# Patient Record
Sex: Male | Born: 1935 | Race: White | Hispanic: No | Smoking: Former smoker
Health system: Southern US, Community
[De-identification: ages and names within clinical notes are randomized; demographics above are authoritative.]

## PROBLEM LIST (undated history)

## (undated) DIAGNOSIS — Z96659 Presence of unspecified artificial knee joint: Secondary | ICD-10-CM

## (undated) DIAGNOSIS — E119 Type 2 diabetes mellitus without complications: Secondary | ICD-10-CM

## (undated) DIAGNOSIS — M1990 Unspecified osteoarthritis, unspecified site: Secondary | ICD-10-CM

## (undated) DIAGNOSIS — H269 Unspecified cataract: Secondary | ICD-10-CM

## (undated) DIAGNOSIS — Z96643 Presence of artificial hip joint, bilateral: Secondary | ICD-10-CM

## (undated) HISTORY — PX: JOINT REPLACEMENT: SHX530

## (undated) HISTORY — DX: Unspecified cataract: H26.9

## (undated) HISTORY — DX: Presence of artificial hip joint, bilateral: Z96.643

---

## 1997-06-24 ENCOUNTER — Encounter: Admission: RE | Admit: 1997-06-24 | Discharge: 1997-09-22 | Payer: Self-pay | Admitting: Internal Medicine

## 2000-11-06 ENCOUNTER — Other Ambulatory Visit: Admission: RE | Admit: 2000-11-06 | Discharge: 2000-11-06 | Payer: Self-pay | Admitting: Dermatology

## 2001-11-02 ENCOUNTER — Encounter: Payer: Self-pay | Admitting: Family Medicine

## 2001-11-02 ENCOUNTER — Ambulatory Visit (HOSPITAL_COMMUNITY): Admission: RE | Admit: 2001-11-02 | Discharge: 2001-11-02 | Payer: Self-pay | Admitting: Family Medicine

## 2001-11-26 ENCOUNTER — Encounter: Payer: Self-pay | Admitting: Neurosurgery

## 2001-11-26 ENCOUNTER — Ambulatory Visit (HOSPITAL_COMMUNITY): Admission: RE | Admit: 2001-11-26 | Discharge: 2001-11-26 | Payer: Self-pay | Admitting: Neurosurgery

## 2002-08-09 ENCOUNTER — Encounter: Payer: Self-pay | Admitting: Ophthalmology

## 2002-08-09 ENCOUNTER — Ambulatory Visit (HOSPITAL_COMMUNITY): Admission: RE | Admit: 2002-08-09 | Discharge: 2002-08-09 | Payer: Self-pay | Admitting: Ophthalmology

## 2002-10-04 ENCOUNTER — Ambulatory Visit (HOSPITAL_COMMUNITY): Admission: RE | Admit: 2002-10-04 | Discharge: 2002-10-04 | Payer: Self-pay | Admitting: Family Medicine

## 2002-10-04 ENCOUNTER — Encounter: Payer: Self-pay | Admitting: Family Medicine

## 2009-05-04 ENCOUNTER — Inpatient Hospital Stay (HOSPITAL_COMMUNITY): Admission: RE | Admit: 2009-05-04 | Discharge: 2009-05-07 | Payer: Self-pay | Admitting: Orthopedic Surgery

## 2009-06-15 ENCOUNTER — Ambulatory Visit (HOSPITAL_COMMUNITY)
Admission: RE | Admit: 2009-06-15 | Discharge: 2009-06-15 | Payer: Self-pay | Source: Home / Self Care | Admitting: Family Medicine

## 2009-06-23 ENCOUNTER — Ambulatory Visit (HOSPITAL_COMMUNITY): Admission: RE | Admit: 2009-06-23 | Discharge: 2009-06-23 | Payer: Self-pay | Admitting: Family Medicine

## 2009-09-02 ENCOUNTER — Emergency Department (HOSPITAL_COMMUNITY): Admission: EM | Admit: 2009-09-02 | Discharge: 2009-09-02 | Payer: Self-pay | Admitting: Emergency Medicine

## 2010-01-31 ENCOUNTER — Encounter: Payer: Self-pay | Admitting: Orthopedic Surgery

## 2010-03-30 LAB — GLUCOSE, CAPILLARY
Glucose-Capillary: 141 mg/dL — ABNORMAL HIGH (ref 70–99)
Glucose-Capillary: 159 mg/dL — ABNORMAL HIGH (ref 70–99)
Glucose-Capillary: 187 mg/dL — ABNORMAL HIGH (ref 70–99)
Glucose-Capillary: 203 mg/dL — ABNORMAL HIGH (ref 70–99)
Glucose-Capillary: 215 mg/dL — ABNORMAL HIGH (ref 70–99)
Glucose-Capillary: 236 mg/dL — ABNORMAL HIGH (ref 70–99)
Glucose-Capillary: 248 mg/dL — ABNORMAL HIGH (ref 70–99)
Glucose-Capillary: 300 mg/dL — ABNORMAL HIGH (ref 70–99)
Glucose-Capillary: 92 mg/dL (ref 70–99)

## 2010-03-30 LAB — COMPREHENSIVE METABOLIC PANEL
ALT: 16 U/L (ref 0–53)
AST: 21 U/L (ref 0–37)
Alkaline Phosphatase: 64 U/L (ref 39–117)
Calcium: 9.5 mg/dL (ref 8.4–10.5)
Chloride: 102 mEq/L (ref 96–112)
Glucose, Bld: 78 mg/dL (ref 70–99)
Total Bilirubin: 0.9 mg/dL (ref 0.3–1.2)

## 2010-03-30 LAB — TYPE AND SCREEN: Antibody Screen: NEGATIVE

## 2010-03-30 LAB — URINALYSIS, ROUTINE W REFLEX MICROSCOPIC
Bilirubin Urine: NEGATIVE
Hgb urine dipstick: NEGATIVE
Protein, ur: NEGATIVE mg/dL

## 2010-03-30 LAB — CBC
HCT: 27.5 % — ABNORMAL LOW (ref 39.0–52.0)
Hemoglobin: 10.7 g/dL — ABNORMAL LOW (ref 13.0–17.0)
Hemoglobin: 7.7 g/dL — ABNORMAL LOW (ref 13.0–17.0)
MCHC: 34 g/dL (ref 30.0–36.0)
MCHC: 34.1 g/dL (ref 30.0–36.0)
Platelets: 145 10*3/uL — ABNORMAL LOW (ref 150–400)
Platelets: 191 10*3/uL (ref 150–400)
Platelets: 243 10*3/uL (ref 150–400)
RBC: 4.11 MIL/uL — ABNORMAL LOW (ref 4.22–5.81)
RDW: 12.4 % (ref 11.5–15.5)
RDW: 12.8 % (ref 11.5–15.5)
WBC: 10.2 10*3/uL (ref 4.0–10.5)

## 2010-03-30 LAB — PROTIME-INR
INR: 2.12 — ABNORMAL HIGH (ref 0.00–1.49)
Prothrombin Time: 13.6 seconds (ref 11.6–15.2)
Prothrombin Time: 14.7 seconds (ref 11.6–15.2)
Prothrombin Time: 19.3 seconds — ABNORMAL HIGH (ref 11.6–15.2)
Prothrombin Time: 23.6 seconds — ABNORMAL HIGH (ref 11.6–15.2)

## 2010-03-30 LAB — ABO/RH: ABO/RH(D): A NEG

## 2010-03-30 LAB — BASIC METABOLIC PANEL
BUN: 23 mg/dL (ref 6–23)
Calcium: 8.3 mg/dL — ABNORMAL LOW (ref 8.4–10.5)
Calcium: 8.9 mg/dL (ref 8.4–10.5)
Chloride: 96 mEq/L (ref 96–112)
Creatinine, Ser: 1.32 mg/dL (ref 0.4–1.5)
GFR calc Af Amer: >60 mL/min (ref >60)
GFR calc non Af Amer: 49 mL/min — ABNORMAL LOW (ref >60)
Glucose, Bld: 290 mg/dL — ABNORMAL HIGH (ref 70–99)
Sodium: 134 mEq/L — ABNORMAL LOW (ref 135–145)

## 2010-03-30 LAB — APTT: aPTT: 31 seconds (ref 24–37)

## 2010-04-05 ENCOUNTER — Other Ambulatory Visit: Payer: Self-pay | Admitting: Orthopedic Surgery

## 2010-04-05 ENCOUNTER — Encounter (HOSPITAL_COMMUNITY): Payer: Medicare Other | Attending: Orthopedic Surgery

## 2010-04-05 DIAGNOSIS — Z01818 Encounter for other preprocedural examination: Secondary | ICD-10-CM | POA: Insufficient documentation

## 2010-04-05 LAB — COMPREHENSIVE METABOLIC PANEL
ALT: 13 U/L (ref 0–53)
AST: 22 U/L (ref 0–37)
Calcium: 9.6 mg/dL (ref 8.4–10.5)
Creatinine, Ser: 1.36 mg/dL (ref 0.4–1.5)
GFR calc Af Amer: >60 mL/min (ref >60)
GFR calc non Af Amer: 51 mL/min — ABNORMAL LOW (ref >60)
Glucose, Bld: 97 mg/dL (ref 70–99)
Sodium: 137 mEq/L (ref 135–145)
Total Protein: 7.9 g/dL (ref 6.0–8.3)

## 2010-04-05 LAB — CBC
HCT: 38.8 % — ABNORMAL LOW (ref 39.0–52.0)
Hemoglobin: 12.4 g/dL — ABNORMAL LOW (ref 13.0–17.0)
MCH: 31.6 pg (ref 26.0–34.0)
MCV: 99 fL (ref 78.0–100.0)
RBC: 3.92 MIL/uL — ABNORMAL LOW (ref 4.22–5.81)

## 2010-04-05 LAB — URINE MICROSCOPIC-ADD ON

## 2010-04-05 LAB — URINALYSIS, ROUTINE W REFLEX MICROSCOPIC
Bilirubin Urine: NEGATIVE
Glucose, UA: 250 mg/dL — AB
Ketones, ur: NEGATIVE mg/dL
Specific Gravity, Urine: 1.02 (ref 1.005–1.030)
pH: 5.5 (ref 5.0–8.0)

## 2010-04-05 LAB — SURGICAL PCR SCREEN
MRSA, PCR: NEGATIVE
Staphylococcus aureus: POSITIVE — AB

## 2010-04-05 LAB — PROTIME-INR: Prothrombin Time: 12.9 seconds (ref 11.6–15.2)

## 2010-04-05 LAB — APTT: aPTT: 30 seconds (ref 24–37)

## 2010-04-09 ENCOUNTER — Emergency Department (HOSPITAL_COMMUNITY)
Admission: EM | Admit: 2010-04-09 | Discharge: 2010-04-09 | Disposition: A | Discharge status: Home / Self Care | Payer: Medicare Other | Attending: Emergency Medicine | Admitting: Emergency Medicine

## 2010-04-09 DIAGNOSIS — IMO0002 Reserved for concepts with insufficient information to code with codable children: Secondary | ICD-10-CM | POA: Insufficient documentation

## 2010-04-09 DIAGNOSIS — E119 Type 2 diabetes mellitus without complications: Secondary | ICD-10-CM | POA: Insufficient documentation

## 2010-04-14 ENCOUNTER — Inpatient Hospital Stay (HOSPITAL_COMMUNITY)
Admission: RE | Admit: 2010-04-14 | Discharge: 2010-04-18 | DRG: 470 | Disposition: A | Discharge status: Home Health | Payer: Medicare Other | Source: Ambulatory Visit | Attending: Orthopedic Surgery | Admitting: Orthopedic Surgery

## 2010-04-14 DIAGNOSIS — R112 Nausea with vomiting, unspecified: Secondary | ICD-10-CM | POA: Diagnosis not present

## 2010-04-14 DIAGNOSIS — D62 Acute posthemorrhagic anemia: Secondary | ICD-10-CM | POA: Diagnosis not present

## 2010-04-14 DIAGNOSIS — Z96659 Presence of unspecified artificial knee joint: Secondary | ICD-10-CM

## 2010-04-14 DIAGNOSIS — I10 Essential (primary) hypertension: Secondary | ICD-10-CM | POA: Diagnosis present

## 2010-04-14 DIAGNOSIS — E871 Hypo-osmolality and hyponatremia: Secondary | ICD-10-CM | POA: Diagnosis not present

## 2010-04-14 DIAGNOSIS — Z01812 Encounter for preprocedural laboratory examination: Secondary | ICD-10-CM

## 2010-04-14 DIAGNOSIS — E7800 Pure hypercholesterolemia, unspecified: Secondary | ICD-10-CM | POA: Diagnosis present

## 2010-04-14 DIAGNOSIS — E119 Type 2 diabetes mellitus without complications: Secondary | ICD-10-CM | POA: Diagnosis present

## 2010-04-14 DIAGNOSIS — M1710 Unilateral primary osteoarthritis, unspecified knee: Principal | ICD-10-CM | POA: Diagnosis present

## 2010-04-14 LAB — GLUCOSE, CAPILLARY
Glucose-Capillary: 74 mg/dL (ref 70–99)
Glucose-Capillary: 89 mg/dL (ref 70–99)
Glucose-Capillary: 86 mg/dL (ref 70–99)
Glucose-Capillary: 90 mg/dL (ref 70–99)
Glucose-Capillary: 181 mg/dL — ABNORMAL HIGH (ref 70–99)

## 2010-04-15 LAB — CBC
HCT: 29.2 % — ABNORMAL LOW (ref 39.0–52.0)
Hemoglobin: 9.4 g/dL — ABNORMAL LOW (ref 13.0–17.0)
MCH: 31.5 pg (ref 26.0–34.0)
MCHC: 32.2 g/dL (ref 30.0–36.0)
MCV: 98 fL (ref 78.0–100.0)
RBC: 2.98 MIL/uL — ABNORMAL LOW (ref 4.22–5.81)

## 2010-04-15 LAB — GLUCOSE, CAPILLARY
Glucose-Capillary: 251 mg/dL — ABNORMAL HIGH (ref 70–99)
Glucose-Capillary: 136 mg/dL — ABNORMAL HIGH (ref 70–99)

## 2010-04-15 LAB — BASIC METABOLIC PANEL
CO2: 28 mEq/L (ref 19–32)
Calcium: 8.5 mg/dL (ref 8.4–10.5)
Chloride: 102 mEq/L (ref 96–112)
Creatinine, Ser: 1.01 mg/dL (ref 0.4–1.5)
GFR calc Af Amer: >60 mL/min (ref >60)
Glucose, Bld: 273 mg/dL — ABNORMAL HIGH (ref 70–99)

## 2010-04-16 LAB — BASIC METABOLIC PANEL
BUN: 22 mg/dL (ref 6–23)
CO2: 26 mEq/L (ref 19–32)
Calcium: 8.6 mg/dL (ref 8.4–10.5)
Creatinine, Ser: 1.29 mg/dL (ref 0.4–1.5)
GFR calc non Af Amer: 54 mL/min — ABNORMAL LOW (ref >60)
Glucose, Bld: 179 mg/dL — ABNORMAL HIGH (ref 70–99)
Sodium: 134 mEq/L — ABNORMAL LOW (ref 135–145)

## 2010-04-16 LAB — CBC
Hemoglobin: 8.7 g/dL — ABNORMAL LOW (ref 13.0–17.0)
MCHC: 32.8 g/dL (ref 30.0–36.0)
RDW: 12.6 % (ref 11.5–15.5)
WBC: 8.6 10*3/uL (ref 4.0–10.5)

## 2010-04-16 LAB — GLUCOSE, CAPILLARY
Glucose-Capillary: 216 mg/dL — ABNORMAL HIGH (ref 70–99)
Glucose-Capillary: 283 mg/dL — ABNORMAL HIGH (ref 70–99)

## 2010-04-17 LAB — CBC
HCT: 22.9 % — ABNORMAL LOW (ref 39.0–52.0)
Hemoglobin: 7.5 g/dL — ABNORMAL LOW (ref 13.0–17.0)
MCH: 31.9 pg (ref 26.0–34.0)
MCHC: 32.8 g/dL (ref 30.0–36.0)
MCV: 97.4 fL (ref 78.0–100.0)
RBC: 2.35 MIL/uL — ABNORMAL LOW (ref 4.22–5.81)

## 2010-04-17 LAB — GLUCOSE, CAPILLARY
Glucose-Capillary: 189 mg/dL — ABNORMAL HIGH (ref 70–99)
Glucose-Capillary: 287 mg/dL — ABNORMAL HIGH (ref 70–99)

## 2010-04-18 LAB — TYPE AND SCREEN
ABO/RH(D): A NEG
Unit division: 0
Unit division: 0

## 2010-04-18 LAB — HEMOGLOBIN AND HEMATOCRIT, BLOOD
HCT: 27.6 % — ABNORMAL LOW (ref 39.0–52.0)
Hemoglobin: 9.2 g/dL — ABNORMAL LOW (ref 13.0–17.0)

## 2010-04-19 NOTE — H&P (Addendum)
[name], [name] NO.:  [id number]  MEDICAL RECORD NO.:  [id number]          PATIENT TYPE:  LOCATION:                                 FACILITY:  PHYSICIAN:  Ollen Gross, M.D.         DATE OF BIRTH:  DATE OF ADMISSION: DATE OF DISCHARGE:                             HISTORY & PHYSICAL   CHIEF COMPLAINT:  Right knee pain.  BRIEF HISTORY:  The patient had a left total knee replacement about a year ago.  He states his right knee is continuing to worsen.  It is at a stage  now where he feels that it will give out on him.  It is limiting what he is able to do.  He has done very well with his left total knee and he now presents for right total knee arthroplasty.  The patient has been cleared for surgery by his primary care physician, Dr.  Lilyan King.  MEDICATION ALLERGIES:  None.  CURRENT MEDICATIONS:  Include insulin, atenolol, pravastatin, and lisinopril.  PAST MEDICAL HISTORY: 1. Osteoarthritis of the right knee. 2. Hypertension. 3. Hypercholesterolemia. 4. Insulin dependent diabetes. 5. Arthritis.  PAST SURGICAL HISTORY:  Left knee arthroscopy x2, left total knee arthroplasty and tonsillectomy.  FAMILY HISTORY:  Father passed at the age of 35 from natural causes. Mother passed at age of 56, she was diabetic.  SOCIAL HISTORY:  The patient is single and states that he is retired [employer] worker.  He plans to go home following this hospital stay.  He does have someone lined up to be his caregiver.  He admits past use of cigarettes over 40 years ago.  REVIEW OF SYSTEMS:  GENERAL:  Negative for fevers, chills or weight change.  HEENT:  Negative for blurred vision or insomnia.  DERMATOLOGIC: Negative for rash or lesion.  RESPIRATORY:  Negative for shortness of breath at rest or with exertion.  CARDIOVASCULAR:  Negative for chest pain or palpitations.  GI:  Negative for nausea, vomiting, diarrhea. GU:  Negative for hematuria, dysuria.   MUSCULOSKELETAL:  Positive for joint pain and joint swelling.  PHYSICAL EXAMINATION:  VITAL SIGNS:  Pulse 70, respirations 18, blood pressure 134/68 in the left arm. GENERAL:  The patient is alert and oriented x3.  He is a pleasant 77- year-old male who is at a stated height of 5 feet 11 inches and stated weight of 206 pounds.  He is in no apparent distress. HEENT: Normocephalic, atraumatic.  Extraocular movements intact.  The patient wears glasses. NECK:  Supple.  Full range of motion without lymphadenopathy. CHEST:  Lungs are clear to auscultation bilaterally without wheezes, rhonchi or rales. HEART:  Regular rate and rhythm without murmur, S1 and S2 sounds are appreciated. ABDOMEN:  Soft, nontender to palpation.  Bowel sounds present in all 4 quadrants. EXTREMITIES:  Right knee is negative for effusion.  Range of 10 to 115 degrees, tenderness with palpation medially. SKIN:  Unremarkable. NEUROLOGIC:  Intact.  Peripheral vascular, carotid pulses 2+ bilaterally without bruit.  RADIOGRAPHS:  AP and lateral views of the patient's right knee reveal bone on bone in the medial  and patellofemoral compartments.  IMPRESSION:  End-stage arthritis of the right knee.  PLAN:  Right total knee arthroplasty to be performed by Dr. Lequita Halt.     Rozell Searing, PAC   ______________________________ Ollen Gross, M.D.    LD/MEDQ  D:  04/14/2010  T:  04/14/2010  Job:  [id number]  cc:   Lorin Picket A. Gerda Diss, MD Fax: [phone]  Electronically Signed by Ollen Gross M.D. on 04/19/2010 06:39:14 AM Electronically Signed by Rozell Searing  on 04/19/2010 08:44:17 AM

## 2010-04-19 NOTE — Op Note (Signed)
[name], [name]            ACCOUNT NO.:  [id number]  MEDICAL RECORD NO.:  [id number]           PATIENT TYPE:  I  LOCATION:  1620                         FACILITY:  [facility]  PHYSICIAN:  Ollen Gross, M.D.    DATE OF BIRTH:  1932-09-17  DATE OF PROCEDURE:  04/14/2010 DATE OF DISCHARGE:                              OPERATIVE REPORT   PREOPERATIVE DIAGNOSIS:  Osteoarthritis, right knee.  POSTOPERATIVE DIAGNOSIS:  Osteoarthritis, right knee.  PROCEDURE:  Right total knee arthroplasty.  SURGEON:  Ollen Gross, MD  ASSISTANT:  Alexzandrew L. Perkins, PA-C  ANESTHESIA:  Spinal.  ESTIMATED BLOOD LOSS:  Minimal.  DRAIN:  Hemovac x1.  TOURNIQUET TIME:  50 minutes at 300 mmHg.  COMPLICATIONS:  None.  CONDITION:  Stable to Recovery.  BRIEF CLINICAL NOTE:  [name] is a 77 year old male with advanced end-stage arthritis of the right knee with progressively worsening pain and dysfunction.  He has had a previous successful left total knee arthroplasty, he presents now for right total knee arthroplasty.  PROCEDURE IN DETAIL:  After successful administration of spinal anesthetic, a tourniquet was placed high on his right thigh and his right lower extremity was prepped and draped in the usual sterile fashion.  Extremity was wrapped in Esmarch, knee flexed, and tourniquet inflated to 300 mmHg.  Midline incision was made with a 10 blade through subcutaneous tissue to the level of the extensor mechanism.  A fresh blade was used to make a medial parapatellar arthrotomy.  Soft tissue on the proximal medial tibia was subperiosteally elevated to the joint line with a knife and into the semimembranosus bursa with a Cobb elevator. Soft tissue laterally was elevated with attention being paid to avoid any patellar tendon on tibial tubercle.  Patella was everted, knee flexed to 90 degrees, ACL and PCL removed.  Drill was used to create a starting hole in the distal femur and the  canal was thoroughly irrigated.  The 5-degree right valgus alignment guide was placed and the distal femoral cutting block was pinned to remove 12 mm off the distal femur.  I did 12 because he had about 30 degrees flexion contracture. Distal femoral resection was made with an oscillating saw.  The tibia was then subluxed forward and the menisci were removed.  The extramedullary tibial alignment guide was placed referencing proximally at the tibial tubercle and distally along the 2nd metatarsal axis and tibial crest.  Block was pinned to remove 2 mm off the more deficient medial side.  Tibial resection was made with an oscillating saw.  The size 4 was the most appropriate tibial component and the proximal tibia was prepared with a modular drill and keel punched for the size 4. Femoral preparation was then initiated.  Femoral sizing guide was placed and size 5 was most appropriate. Rotation was marked off the epicondylar axis and confirmed by creating rectangular flexion gap at 90 degrees.  The block was pinned in this rotation.  The anterior and posterior chamfer cuts were made. Intercondylar block was placed and that cut was made.  Trial size 5 posterior stabilized femoral compliment was made.  Laminar spreader was placed  in the flexion space and the posterior osteophytes were removed.  A trial size 10-mm posterior stabilized rotating platform insert was placed.  We lacked it about 5 degrees for full extension, it was also tight in flexion, so I then removed the tibial trial, placed the cutting guide and removed additional 2 mm off the tibia.  We then redid the drill and keel punch and placed the trial size 4 again.  At this time with a 10-mm insert, full extension was achieved with excellent varus- valgus and anterior-posterior balance, throughout full range of motion. Knee was held in extension and then the patella was addressed.  Patella was everted again and thickness measured to  be 25 mm.  Freehand resection was taken to 14 mm, 41 template was placed, lug holes were drilled, trial patella was placed and it tracked normally.  The trials were then removed and then the cut bone surfaces were prepared with pulsatile lavage.  Cement was mixed and once ready for implantation, the size 4 mobile-bearing tibial tray, size 5 posterior stabilized femur, and the 41 patella were cemented into place and the patella was held with a clamp.  The trial 10-mm insert was placed, knee held in full extension, and all extruded cement removed.  When the cement was fully hardened, then the permanent 10-mm posterior stabilized rotating platform insert was placed in the tibial tray.  Wound was copiously irrigated with saline solution and the arthrotomy closed over Hemovac drain with interrupted #1 PDS.  Flexion against gravity was about 135 degrees, patella tracked normally.  Tourniquet was then released for a total tourniquet time of 50 minutes.  The subcutaneous was then closed with interrupted 2-0 Vicryl and subcuticular with running 4-0 Monocryl. Catheter for Marcaine pain pump was placed and the pump was initiated. Incision was cleaned and dried and Steri-Strips and a bulky sterile dressing applied.  He was then placed into a knee immobilizer, awakened, and transported to Recovery in stable condition.     Ollen Gross, M.D.     FA/MEDQ  D:  04/14/2010  T:  04/15/2010  Job:  [id number]  Electronically Signed by Ollen Gross M.D. on 04/19/2010 06:39:12 AM

## 2010-05-10 NOTE — Discharge Summary (Signed)
[name], [name]            ACCOUNT NO.:  [id number]  MEDICAL RECORD NO.:  [id number]           PATIENT TYPE:  I  LOCATION:  1620                         FACILITY:  [facility]  PHYSICIAN:  Ollen Gross, M.D.    DATE OF BIRTH:  1932/05/13  DATE OF ADMISSION:  04/14/2010 DATE OF DISCHARGE:  04/18/2010                              DISCHARGE SUMMARY   ADMITTING DIAGNOSES: 1. Osteoarthritis, right knee. 2. Hypertension. 3. Hypercholesterolemia. 4. Insulin-dependent diabetes mellitus. 5. Arthritis.  DISCHARGE DIAGNOSES: 1. Osteoarthritis right knee, status post right total knee replacement     arthroplasty. 2. Postop acute blood loss anemia. 3. Status post transfusion without sequelae. 4. Postop hyponatremia. 5. Hypertension. 6. Hypercholesterolemia. 7. Insulin-dependent diabetes mellitus. 8. Arthritis.  PROCEDURE:  On April 14, 2010, right total knee.  Surgeon, Dr. Lequita Halt. Assistant, Patrica Duel, PA-C.  Spinal anesthesia.  Tourniquet time 50 minutes.  CONSULTS:  None.  BRIEF HISTORY:  [name] is a 77 year old male with advanced arthritis of the right knee, progressive worsening pain and dysfunction, successful left total knee, now presents for a right total knee.  LABORATORY DATA:  I do not have the admission CBC for this patient, it was not scanned into the chart, but the postop hemoglobin was 9.4 drifted down to 7.5 and on postop day #3 the patient did receive blood, post transfusion hemoglobin back up to 9.2 with a hematocrit of 27.6. I do not have the admission Chem panel for this patient scanned in the chart, but followup BMET showed a sodium low on day #1 of 133 came back up to 134.  Remaining electrolytes remained within normal limits. Glucose was elevated, serum glucose on day #1 was 273, but came back down to 179.  Blood group type A negative.  HOSPITAL COURSE:  The patient was admitted to [facility], taken to OR, underwent  above-stated procedure without complication.  The patient tolerated the procedure well, later transferred to recovery room and then to the orthopedic floor.  Started on p.o. and IV analgesics. Given 24 hours postop IV antibiotics.  Did have some sedation and nausea and vomiting through the night.  By day #1, had fair amount of pain through the night also, but doing a little bit better on day #1.  The nausea had improved, placed on Xarelto for DVT prophylaxis.  Started back on home medications at 70/30 insulin at home, but  started on sliding scale for the diabetes, allowed to be weightbearing as tolerated.  Hemoglobin was down to 9.4, started on iron.  Had excellent urinary output, diuresing fluids well.  Sodium was a little low at 133, but diuresing so we just rechecked that.  By day #2, the patient's sodium was already back up.  Hemoglobin was down a little bit at 8.7. Dressing changed, incision looked good.  Felt that he would continue progressing, be able to go home in the next day or so.  Unfortunately, by day #3, he had low hemoglobin and symptomatic with it.  The patient was given 2 units of blood and his discharge was held, and he tolerated the blood well.  The hemoglobin came back  up to a level of 9.2.  The patient was meeting goals at that time and was discharged home.  DISCHARGE PLAN: 1. The patient was discharged home on April 18, 2010. 2. Discharge diagnoses, please see above. 3. Discharge medications, Nu-Iron, Robaxin, Percocet, Xarelto;     continue aspirin, Humulin 70/30 insulin. 4. Diet, diabetic diet. 5. Activity, weightbearing as tolerated, total knee protocol.  Follow     up in 2 weeks.  DISPOSITION:  Home.  CONDITION UP ON DISCHARGE:  Improving.     [name], P.A.C.   ______________________________ Ollen Gross, M.D.    ALP/MEDQ  D:  04/29/2010  T:  04/30/2010  Job:  [id number]  cc:   Lorin Picket A. Gerda Diss, MD Fax: [phone]  Electronically  Signed by Patrica Duel P.A.C. on 05/03/2010 07:58:44 AM Electronically Signed by Ollen Gross M.D. on 05/10/2010 07:10:17 AM

## 2010-11-08 ENCOUNTER — Encounter (INDEPENDENT_AMBULATORY_CARE_PROVIDER_SITE_OTHER): Payer: Medicare Other | Admitting: Ophthalmology

## 2010-11-08 DIAGNOSIS — H2510 Age-related nuclear cataract, unspecified eye: Secondary | ICD-10-CM

## 2010-11-08 DIAGNOSIS — E11319 Type 2 diabetes mellitus with unspecified diabetic retinopathy without macular edema: Secondary | ICD-10-CM

## 2010-11-08 DIAGNOSIS — H43819 Vitreous degeneration, unspecified eye: Secondary | ICD-10-CM

## 2010-11-08 DIAGNOSIS — E11359 Type 2 diabetes mellitus with proliferative diabetic retinopathy without macular edema: Secondary | ICD-10-CM

## 2011-02-02 ENCOUNTER — Ambulatory Visit (HOSPITAL_COMMUNITY)
Admission: RE | Admit: 2011-02-02 | Discharge: 2011-02-02 | Disposition: A | Discharge status: Home / Self Care | Payer: Medicare Other | Source: Ambulatory Visit | Attending: Family Medicine | Admitting: Family Medicine

## 2011-02-02 ENCOUNTER — Other Ambulatory Visit: Payer: Self-pay | Admitting: Family Medicine

## 2011-02-02 DIAGNOSIS — M1610 Unilateral primary osteoarthritis, unspecified hip: Secondary | ICD-10-CM | POA: Diagnosis not present

## 2011-02-02 DIAGNOSIS — M47817 Spondylosis without myelopathy or radiculopathy, lumbosacral region: Secondary | ICD-10-CM | POA: Diagnosis not present

## 2011-02-02 DIAGNOSIS — M545 Low back pain, unspecified: Secondary | ICD-10-CM | POA: Diagnosis not present

## 2011-02-02 DIAGNOSIS — M169 Osteoarthritis of hip, unspecified: Secondary | ICD-10-CM | POA: Insufficient documentation

## 2011-02-02 DIAGNOSIS — M25559 Pain in unspecified hip: Secondary | ICD-10-CM | POA: Insufficient documentation

## 2011-02-02 DIAGNOSIS — M5137 Other intervertebral disc degeneration, lumbosacral region: Secondary | ICD-10-CM | POA: Diagnosis not present

## 2011-02-02 DIAGNOSIS — M25859 Other specified joint disorders, unspecified hip: Secondary | ICD-10-CM | POA: Diagnosis not present

## 2011-02-03 ENCOUNTER — Other Ambulatory Visit: Payer: Self-pay | Admitting: Family Medicine

## 2011-02-03 DIAGNOSIS — M549 Dorsalgia, unspecified: Secondary | ICD-10-CM

## 2011-02-08 ENCOUNTER — Ambulatory Visit (HOSPITAL_COMMUNITY)
Admission: RE | Admit: 2011-02-08 | Discharge: 2011-02-08 | Disposition: A | Discharge status: Home / Self Care | Payer: Medicare Other | Source: Ambulatory Visit | Attending: Family Medicine | Admitting: Family Medicine

## 2011-02-08 DIAGNOSIS — M5137 Other intervertebral disc degeneration, lumbosacral region: Secondary | ICD-10-CM | POA: Diagnosis not present

## 2011-02-08 DIAGNOSIS — M545 Low back pain, unspecified: Secondary | ICD-10-CM | POA: Insufficient documentation

## 2011-02-08 DIAGNOSIS — M5126 Other intervertebral disc displacement, lumbar region: Secondary | ICD-10-CM | POA: Diagnosis not present

## 2011-02-08 DIAGNOSIS — M549 Dorsalgia, unspecified: Secondary | ICD-10-CM

## 2011-02-08 DIAGNOSIS — M51379 Other intervertebral disc degeneration, lumbosacral region without mention of lumbar back pain or lower extremity pain: Secondary | ICD-10-CM | POA: Insufficient documentation

## 2011-02-08 DIAGNOSIS — M47817 Spondylosis without myelopathy or radiculopathy, lumbosacral region: Secondary | ICD-10-CM | POA: Diagnosis not present

## 2011-02-24 DIAGNOSIS — M47817 Spondylosis without myelopathy or radiculopathy, lumbosacral region: Secondary | ICD-10-CM | POA: Diagnosis not present

## 2011-03-11 ENCOUNTER — Encounter (INDEPENDENT_AMBULATORY_CARE_PROVIDER_SITE_OTHER): Payer: Medicare Other | Admitting: Ophthalmology

## 2011-03-11 DIAGNOSIS — E1139 Type 2 diabetes mellitus with other diabetic ophthalmic complication: Secondary | ICD-10-CM

## 2011-03-11 DIAGNOSIS — E11319 Type 2 diabetes mellitus with unspecified diabetic retinopathy without macular edema: Secondary | ICD-10-CM | POA: Diagnosis not present

## 2011-03-11 DIAGNOSIS — H2510 Age-related nuclear cataract, unspecified eye: Secondary | ICD-10-CM

## 2011-03-11 DIAGNOSIS — H43819 Vitreous degeneration, unspecified eye: Secondary | ICD-10-CM

## 2011-03-11 DIAGNOSIS — E11359 Type 2 diabetes mellitus with proliferative diabetic retinopathy without macular edema: Secondary | ICD-10-CM

## 2011-03-16 DIAGNOSIS — E119 Type 2 diabetes mellitus without complications: Secondary | ICD-10-CM | POA: Diagnosis not present

## 2011-03-16 DIAGNOSIS — E1159 Type 2 diabetes mellitus with other circulatory complications: Secondary | ICD-10-CM | POA: Diagnosis not present

## 2011-03-21 DIAGNOSIS — R918 Other nonspecific abnormal finding of lung field: Secondary | ICD-10-CM | POA: Diagnosis not present

## 2011-03-23 DIAGNOSIS — Z79899 Other long term (current) drug therapy: Secondary | ICD-10-CM | POA: Diagnosis not present

## 2011-03-23 DIAGNOSIS — I10 Essential (primary) hypertension: Secondary | ICD-10-CM | POA: Diagnosis not present

## 2011-03-23 DIAGNOSIS — E876 Hypokalemia: Secondary | ICD-10-CM | POA: Diagnosis not present

## 2011-03-23 DIAGNOSIS — E119 Type 2 diabetes mellitus without complications: Secondary | ICD-10-CM | POA: Diagnosis not present

## 2011-03-23 DIAGNOSIS — M47817 Spondylosis without myelopathy or radiculopathy, lumbosacral region: Secondary | ICD-10-CM | POA: Diagnosis not present

## 2011-03-23 DIAGNOSIS — M545 Low back pain, unspecified: Secondary | ICD-10-CM | POA: Diagnosis not present

## 2011-03-23 DIAGNOSIS — Z794 Long term (current) use of insulin: Secondary | ICD-10-CM | POA: Diagnosis not present

## 2011-03-23 DIAGNOSIS — Z7982 Long term (current) use of aspirin: Secondary | ICD-10-CM | POA: Diagnosis not present

## 2011-03-23 HISTORY — PX: BACK SURGERY: SHX140

## 2011-03-24 DIAGNOSIS — M47817 Spondylosis without myelopathy or radiculopathy, lumbosacral region: Secondary | ICD-10-CM | POA: Diagnosis not present

## 2011-05-25 DIAGNOSIS — E1159 Type 2 diabetes mellitus with other circulatory complications: Secondary | ICD-10-CM | POA: Diagnosis not present

## 2011-05-25 DIAGNOSIS — E119 Type 2 diabetes mellitus without complications: Secondary | ICD-10-CM | POA: Diagnosis not present

## 2011-06-01 DIAGNOSIS — E785 Hyperlipidemia, unspecified: Secondary | ICD-10-CM | POA: Diagnosis not present

## 2011-06-01 DIAGNOSIS — E119 Type 2 diabetes mellitus without complications: Secondary | ICD-10-CM | POA: Diagnosis not present

## 2011-06-01 DIAGNOSIS — M129 Arthropathy, unspecified: Secondary | ICD-10-CM | POA: Diagnosis not present

## 2011-06-22 DIAGNOSIS — M549 Dorsalgia, unspecified: Secondary | ICD-10-CM | POA: Diagnosis not present

## 2011-06-22 DIAGNOSIS — M25569 Pain in unspecified knee: Secondary | ICD-10-CM | POA: Diagnosis not present

## 2011-06-22 DIAGNOSIS — E119 Type 2 diabetes mellitus without complications: Secondary | ICD-10-CM | POA: Diagnosis not present

## 2011-07-11 DIAGNOSIS — Q828 Other specified congenital malformations of skin: Secondary | ICD-10-CM | POA: Diagnosis not present

## 2011-07-11 DIAGNOSIS — M549 Dorsalgia, unspecified: Secondary | ICD-10-CM | POA: Diagnosis not present

## 2011-07-12 DIAGNOSIS — M5126 Other intervertebral disc displacement, lumbar region: Secondary | ICD-10-CM | POA: Diagnosis not present

## 2011-07-12 DIAGNOSIS — M47817 Spondylosis without myelopathy or radiculopathy, lumbosacral region: Secondary | ICD-10-CM | POA: Diagnosis not present

## 2011-07-12 DIAGNOSIS — M5137 Other intervertebral disc degeneration, lumbosacral region: Secondary | ICD-10-CM | POA: Diagnosis not present

## 2011-07-21 DIAGNOSIS — M25559 Pain in unspecified hip: Secondary | ICD-10-CM | POA: Diagnosis not present

## 2011-07-21 DIAGNOSIS — M25569 Pain in unspecified knee: Secondary | ICD-10-CM | POA: Diagnosis not present

## 2011-07-21 DIAGNOSIS — M1710 Unilateral primary osteoarthritis, unspecified knee: Secondary | ICD-10-CM | POA: Diagnosis not present

## 2011-08-03 DIAGNOSIS — E1159 Type 2 diabetes mellitus with other circulatory complications: Secondary | ICD-10-CM | POA: Diagnosis not present

## 2011-08-03 DIAGNOSIS — E119 Type 2 diabetes mellitus without complications: Secondary | ICD-10-CM | POA: Diagnosis not present

## 2011-08-08 DIAGNOSIS — M25559 Pain in unspecified hip: Secondary | ICD-10-CM | POA: Diagnosis not present

## 2011-08-08 DIAGNOSIS — Z01818 Encounter for other preprocedural examination: Secondary | ICD-10-CM | POA: Diagnosis not present

## 2011-08-08 DIAGNOSIS — E785 Hyperlipidemia, unspecified: Secondary | ICD-10-CM | POA: Diagnosis not present

## 2011-08-08 DIAGNOSIS — E119 Type 2 diabetes mellitus without complications: Secondary | ICD-10-CM | POA: Diagnosis not present

## 2011-08-19 DIAGNOSIS — M47817 Spondylosis without myelopathy or radiculopathy, lumbosacral region: Secondary | ICD-10-CM | POA: Diagnosis not present

## 2011-09-19 ENCOUNTER — Ambulatory Visit (INDEPENDENT_AMBULATORY_CARE_PROVIDER_SITE_OTHER): Payer: Medicare Other | Admitting: Ophthalmology

## 2011-09-19 DIAGNOSIS — E11319 Type 2 diabetes mellitus with unspecified diabetic retinopathy without macular edema: Secondary | ICD-10-CM

## 2011-09-19 DIAGNOSIS — E1139 Type 2 diabetes mellitus with other diabetic ophthalmic complication: Secondary | ICD-10-CM

## 2011-09-19 DIAGNOSIS — E11359 Type 2 diabetes mellitus with proliferative diabetic retinopathy without macular edema: Secondary | ICD-10-CM

## 2011-09-19 DIAGNOSIS — E1165 Type 2 diabetes mellitus with hyperglycemia: Secondary | ICD-10-CM | POA: Diagnosis not present

## 2011-09-19 DIAGNOSIS — H43819 Vitreous degeneration, unspecified eye: Secondary | ICD-10-CM

## 2011-09-19 DIAGNOSIS — H2510 Age-related nuclear cataract, unspecified eye: Secondary | ICD-10-CM

## 2011-09-26 DIAGNOSIS — M25559 Pain in unspecified hip: Secondary | ICD-10-CM | POA: Diagnosis not present

## 2011-09-26 DIAGNOSIS — E119 Type 2 diabetes mellitus without complications: Secondary | ICD-10-CM | POA: Diagnosis not present

## 2011-10-07 DIAGNOSIS — M1710 Unilateral primary osteoarthritis, unspecified knee: Secondary | ICD-10-CM | POA: Diagnosis not present

## 2011-10-07 DIAGNOSIS — IMO0002 Reserved for concepts with insufficient information to code with codable children: Secondary | ICD-10-CM | POA: Diagnosis not present

## 2011-10-12 ENCOUNTER — Other Ambulatory Visit: Payer: Self-pay | Admitting: Orthopedic Surgery

## 2011-10-12 MED ORDER — BUPIVACAINE LIPOSOME 1.3 % IJ SUSP: 20.0000 mL | Freq: Once | INTRAMUSCULAR | Status: DC

## 2011-10-12 MED ORDER — DEXAMETHASONE SODIUM PHOSPHATE 10 MG/ML IJ SOLN: 10.0000 mg | Freq: Once | INTRAMUSCULAR | Status: DC

## 2011-10-12 NOTE — Progress Notes (Signed)
Preoperative surgical orders have been place into the [facility] hospital system for [place] on 10/12/2011, 4:06 PM  by Patrica Duel for surgery on 11/02/11.  Preop Total Hip orders including Experel Injecion, IV Tylenol, and IV Decadron as long as there are no contraindications to the above medications. Avel Peace, PA-C

## 2011-10-21 DIAGNOSIS — Z23 Encounter for immunization: Secondary | ICD-10-CM | POA: Diagnosis not present

## 2011-10-24 ENCOUNTER — Encounter (HOSPITAL_COMMUNITY): Payer: Self-pay | Admitting: Pharmacy Technician

## 2011-10-25 NOTE — Patient Instructions (Addendum)
[address]  10/25/2011   Your procedure is scheduled on:  11-02-2011  Report to [facility] at  0700 AM.  Call this number if you have problems the morning of surgery: [phone]   Remember:take 1/2 dose of pm insulin on 11-01-2011   Do not eat food or drink liquids:After Midnight.  .  Take these medicines the morning of surgery with A SIP OF WATER: no meds to take   Do not wear jewelry or make up.  Do not wear lotions, powders, or perfumes. You may wear deodorant.    Do not bring valuables to the hospital.  Contacts, dentures or bridgework may not be worn into surgery.  Leave suitcase in the car. After surgery it may be brought to your room.  For patients admitted to the hospital, checkout time is 11:00 AM the day of discharge                             Patients discharged the day of surgery will not be allowed to drive home. If going home same day of surgery, you must have someone stay with you the first 24 hours at home and arrange for some one to drive you home from hospital.    Special Instructions: See [facility] Preparing for Surgery instruction sheet. Women do not shave legs or underarms for 12 hours before showers. Men may shave face morning of surgery.    Please read over the following fact sheets that you were given: MRSA Information, blood fact sheet, incentive spirometer fact sheet  [facility] pre op nurse phone number [phone], call if needed

## 2011-10-26 ENCOUNTER — Ambulatory Visit (HOSPITAL_COMMUNITY)
Admission: RE | Admit: 2011-10-26 | Discharge: 2011-10-26 | Disposition: A | Discharge status: Home / Self Care | Payer: Medicare Other | Source: Ambulatory Visit | Attending: Orthopedic Surgery | Admitting: Orthopedic Surgery

## 2011-10-26 ENCOUNTER — Encounter (HOSPITAL_COMMUNITY): Payer: Self-pay

## 2011-10-26 ENCOUNTER — Encounter (HOSPITAL_COMMUNITY)
Admission: RE | Admit: 2011-10-26 | Discharge: 2011-10-26 | Disposition: A | Discharge status: Home / Self Care | Payer: Medicare Other | Source: Ambulatory Visit | Attending: Orthopedic Surgery | Admitting: Orthopedic Surgery

## 2011-10-26 DIAGNOSIS — M169 Osteoarthritis of hip, unspecified: Secondary | ICD-10-CM | POA: Diagnosis not present

## 2011-10-26 DIAGNOSIS — E119 Type 2 diabetes mellitus without complications: Secondary | ICD-10-CM | POA: Diagnosis not present

## 2011-10-26 DIAGNOSIS — Z01812 Encounter for preprocedural laboratory examination: Secondary | ICD-10-CM | POA: Diagnosis not present

## 2011-10-26 DIAGNOSIS — M25559 Pain in unspecified hip: Secondary | ICD-10-CM | POA: Insufficient documentation

## 2011-10-26 DIAGNOSIS — Z01818 Encounter for other preprocedural examination: Secondary | ICD-10-CM | POA: Insufficient documentation

## 2011-10-26 DIAGNOSIS — T148XXA Other injury of unspecified body region, initial encounter: Secondary | ICD-10-CM | POA: Diagnosis not present

## 2011-10-26 HISTORY — DX: Presence of unspecified artificial knee joint: Z96.659

## 2011-10-26 HISTORY — DX: Type 2 diabetes mellitus without complications: E11.9

## 2011-10-26 HISTORY — DX: Unspecified osteoarthritis, unspecified site: M19.90

## 2011-10-26 LAB — URINALYSIS, ROUTINE W REFLEX MICROSCOPIC
Bilirubin Urine: NEGATIVE
Leukocytes, UA: NEGATIVE
Nitrite: NEGATIVE
Specific Gravity, Urine: 1.021 (ref 1.005–1.030)
Urobilinogen, UA: 0.2 mg/dL (ref 0.0–1.0)
pH: 6 (ref 5.0–8.0)

## 2011-10-26 LAB — PROTIME-INR
INR: 0.95 (ref 0.00–1.49)
Prothrombin Time: 12.6 seconds (ref 11.6–15.2)

## 2011-10-26 LAB — CBC
HCT: 38.7 % — ABNORMAL LOW (ref 39.0–52.0)
MCH: 32.3 pg (ref 26.0–34.0)
MCHC: 33.1 g/dL (ref 30.0–36.0)
MCV: 97.7 fL (ref 78.0–100.0)
Platelets: 257 10*3/uL (ref 150–400)
RDW: 12.5 % (ref 11.5–15.5)
WBC: 7.8 10*3/uL (ref 4.0–10.5)

## 2011-10-26 LAB — COMPREHENSIVE METABOLIC PANEL
AST: 20 U/L (ref 0–37)
Albumin: 3.9 g/dL (ref 3.5–5.2)
BUN: 20 mg/dL (ref 6–23)
Calcium: 9.9 mg/dL (ref 8.4–10.5)
Chloride: 101 mEq/L (ref 96–112)
Creatinine, Ser: 1.19 mg/dL (ref 0.50–1.35)
Total Bilirubin: 0.3 mg/dL (ref 0.3–1.2)
Total Protein: 8 g/dL (ref 6.0–8.3)

## 2011-10-26 LAB — SURGICAL PCR SCREEN: Staphylococcus aureus: NEGATIVE

## 2011-10-26 NOTE — Progress Notes (Addendum)
ekg dr Timothy Lasso hall 08-08-2011 on chart,  Pt with left great  toe area draining serous fluid x 3 weeks, pt advised to see dr hall asap to have toe checked.

## 2011-10-27 ENCOUNTER — Inpatient Hospital Stay (HOSPITAL_COMMUNITY): Admission: RE | Admit: 2011-10-27 | Payer: Medicare Other | Source: Ambulatory Visit

## 2011-10-31 NOTE — H&P (Signed)
TOTAL HIP ADMISSION H&P  Patient is admitted for left total hip arthroplasty.  Subjective:  Chief Complaint: left hip pain  HPI: [name], 78 y.o. male, has a history of pain and functional disability in the left hip(s) due to arthritis and patient has failed non-surgical conservative treatments for greater than 12 weeks to include NSAID's and/or analgesics, use of assistive devices and activity modification.  Onset of symptoms was gradual starting 1 years ago with gradually worsening course since that time.The patient noted no past surgery on the left hip(s).  Patient currently rates pain in the left hip at 8 out of 10 with activity. Patient has worsening of pain with activity and weight bearing, pain that interfers with activities of daily living, pain with passive range of motion and crepitus. Patient has evidence of joint space narrowing and erosion of the femoral head by imaging studies. This condition presents safety issues increasing the risk of falls. There is no current active infection.   Past Medical History  Diagnosis Date  . Diabetes mellitus without complication     takes lisinopril for kidneys  . Arthritis   . S/P knee replacement left 2011, right 2012    Past Surgical History  Procedure Date  . Joint replacement   . Back surgery March 23, 2011    lower back     No prescriptions prior to admission   Allergies  Allergen Reactions  . Morphine And Related Nausea And Vomiting    History  Substance Use Topics  . Smoking status: Former Smoker -- 1.0 packs/day for 20 years    Types: Cigarettes    Quit date: 01/11/1979  . Smokeless tobacco: Former Neurosurgeon    Quit date: 08/23/2011  . Alcohol Use: No    Patient refused to give information about family history.   Review of Systems  Constitutional: Negative.   HENT: Negative.  Negative for neck pain.   Eyes: Negative.   Respiratory: Negative.   Cardiovascular: Negative.   Gastrointestinal: Negative.     Genitourinary: Negative.   Musculoskeletal: Positive for back pain and joint pain. Negative for myalgias and falls.       Left hip pain  Skin: Negative.   Neurological: Negative.   Endo/Heme/Allergies: Negative.   Psychiatric/Behavioral: Negative.     Objective:  Physical Exam  Constitutional: He is oriented to person, place, and time. He appears well-developed and well-nourished. No distress.  HENT:  Head: Normocephalic and atraumatic.  Right Ear: External ear normal.  Left Ear: External ear normal.  Nose: Nose normal.  Mouth/Throat: Oropharynx is clear and moist.  Eyes: Conjunctivae normal and EOM are normal.  Neck: Normal range of motion. Neck supple. No thyromegaly present.  Cardiovascular: Normal rate, regular rhythm, normal heart sounds and intact distal pulses.   No murmur heard. Respiratory: Effort normal and breath sounds normal. No respiratory distress. He has no wheezes. He exhibits no tenderness.  GI: Soft. Bowel sounds are normal. He exhibits no distension and no mass. There is no tenderness.  Musculoskeletal:       Right hip: He exhibits decreased range of motion.       Left hip: He exhibits decreased range of motion.       Right knee: Normal. He exhibits no swelling and no effusion. no tenderness found.       Left knee: He exhibits decreased range of motion. He exhibits no swelling and no effusion. no tenderness found.       Legs: Lymphadenopathy:  He has no cervical adenopathy.  Neurological: He is alert and oriented to person, place, and time. He has normal strength. No sensory deficit.  Skin: No rash noted. He is not diaphoretic. No erythema.  Psychiatric: He has a normal mood and affect.   Vitals Weight: 198 lb Height: 71 in Body Surface Area: 2.12 m Body Mass Index: 27.62 kg/m Pulse: 64 (Regular)  Resp.: 18 (Unlabored) BP: 138/82  (Sitting, Left Arm, Standard)   Imaging Review Plain radiographs demonstrate severe degenerative joint  disease of the left hip(s). The bone quality appears to be fair for age and reported activity level.  Assessment/Plan:  End stage arthritis, left hip(s)  The patient history, physical examination, clinical judgement of the provider and imaging studies are consistent with end stage degenerative joint disease of the left hip(s) and total hip arthroplasty is deemed medically necessary. The treatment options including medical management, injection therapy, arthroscopy and arthroplasty were discussed at length. The risks and benefits of total hip arthroplasty were presented and reviewed. The risks due to aseptic loosening, infection, stiffness, dislocation/subluxation,  thromboembolic complications and other imponderables were discussed.  The patient acknowledged the explanation, agreed to proceed with the plan and consent was signed. Patient is being admitted for inpatient treatment for surgery, pain control, PT, OT, prophylactic antibiotics, VTE prophylaxis, progressive ambulation and ADL's and discharge planning.The patient is planning to be discharged home with home health services     [place], [place]

## 2011-11-02 ENCOUNTER — Encounter (HOSPITAL_COMMUNITY): Payer: Self-pay

## 2011-11-02 ENCOUNTER — Encounter (HOSPITAL_COMMUNITY): Payer: Self-pay | Admitting: *Deleted

## 2011-11-02 ENCOUNTER — Inpatient Hospital Stay (HOSPITAL_COMMUNITY): Payer: Medicare Other

## 2011-11-02 ENCOUNTER — Encounter (HOSPITAL_COMMUNITY): Payer: Self-pay | Admitting: Anesthesiology

## 2011-11-02 ENCOUNTER — Inpatient Hospital Stay (HOSPITAL_COMMUNITY): Payer: Medicare Other | Admitting: Anesthesiology

## 2011-11-02 ENCOUNTER — Encounter (HOSPITAL_COMMUNITY)
Admission: RE | Disposition: A | Discharge status: Skilled Nursing Facility | Payer: Self-pay | Source: Ambulatory Visit | Attending: Orthopedic Surgery

## 2011-11-02 ENCOUNTER — Inpatient Hospital Stay (HOSPITAL_COMMUNITY)
Admission: RE | Admit: 2011-11-02 | Discharge: 2011-11-05 | DRG: 470 | Disposition: A | Discharge status: Skilled Nursing Facility | Payer: Medicare Other | Source: Ambulatory Visit | Attending: Orthopedic Surgery | Admitting: Orthopedic Surgery

## 2011-11-02 DIAGNOSIS — M6281 Muscle weakness (generalized): Secondary | ICD-10-CM | POA: Diagnosis not present

## 2011-11-02 DIAGNOSIS — E871 Hypo-osmolality and hyponatremia: Secondary | ICD-10-CM | POA: Diagnosis not present

## 2011-11-02 DIAGNOSIS — Z96649 Presence of unspecified artificial hip joint: Secondary | ICD-10-CM | POA: Diagnosis not present

## 2011-11-02 DIAGNOSIS — Z5189 Encounter for other specified aftercare: Secondary | ICD-10-CM | POA: Diagnosis not present

## 2011-11-02 DIAGNOSIS — D62 Acute posthemorrhagic anemia: Secondary | ICD-10-CM | POA: Diagnosis not present

## 2011-11-02 DIAGNOSIS — M1610 Unilateral primary osteoarthritis, unspecified hip: Principal | ICD-10-CM | POA: Diagnosis present

## 2011-11-02 DIAGNOSIS — E119 Type 2 diabetes mellitus without complications: Secondary | ICD-10-CM | POA: Diagnosis present

## 2011-11-02 DIAGNOSIS — R279 Unspecified lack of coordination: Secondary | ICD-10-CM | POA: Diagnosis not present

## 2011-11-02 DIAGNOSIS — R262 Difficulty in walking, not elsewhere classified: Secondary | ICD-10-CM | POA: Diagnosis not present

## 2011-11-02 DIAGNOSIS — M169 Osteoarthritis of hip, unspecified: Secondary | ICD-10-CM | POA: Diagnosis present

## 2011-11-02 DIAGNOSIS — M167 Other unilateral secondary osteoarthritis of hip: Secondary | ICD-10-CM | POA: Diagnosis not present

## 2011-11-02 DIAGNOSIS — M1990 Unspecified osteoarthritis, unspecified site: Secondary | ICD-10-CM | POA: Diagnosis not present

## 2011-11-02 DIAGNOSIS — Z471 Aftercare following joint replacement surgery: Secondary | ICD-10-CM | POA: Diagnosis not present

## 2011-11-02 DIAGNOSIS — M25559 Pain in unspecified hip: Secondary | ICD-10-CM | POA: Diagnosis not present

## 2011-11-02 HISTORY — PX: TOTAL HIP ARTHROPLASTY: SHX124

## 2011-11-02 LAB — GLUCOSE, CAPILLARY: Glucose-Capillary: 148 mg/dL — ABNORMAL HIGH (ref 70–99)

## 2011-11-02 SURGERY — ARTHROPLASTY, HIP, TOTAL,POSTERIOR APPROACH
Anesthesia: Spinal | Site: Hip | Laterality: Left | Wound class: Clean

## 2011-11-02 MED ORDER — SODIUM CHLORIDE 0.9 % IV SOLN: INTRAVENOUS | Status: DC

## 2011-11-02 MED ORDER — ONDANSETRON HCL 4 MG/2ML IJ SOLN
4.0000 mg | Freq: Four times a day (QID) | INTRAMUSCULAR | Status: DC | PRN
  Administered 2011-11-03: 4 mg via INTRAVENOUS
  Filled 2011-11-02 (×2): qty 2

## 2011-11-02 MED ORDER — PROPOFOL INFUSION 10 MG/ML OPTIME
INTRAVENOUS | Status: DC | PRN
  Administered 2011-11-02: 140 ug/kg/min via INTRAVENOUS

## 2011-11-02 MED ORDER — CEFAZOLIN SODIUM 1-5 GM-% IV SOLN
1.0000 g | Freq: Four times a day (QID) | INTRAVENOUS | Status: AC
  Administered 2011-11-02 (×2): 1 g via INTRAVENOUS
  Filled 2011-11-02 (×3): qty 50

## 2011-11-02 MED ORDER — POLYETHYLENE GLYCOL 3350 17 G PO PACK: 17.0000 g | PACK | Freq: Every day | ORAL | Status: DC | PRN

## 2011-11-02 MED ORDER — PHENOL 1.4 % MT LIQD: 1.0000 | OROMUCOSAL | Status: DC | PRN

## 2011-11-02 MED ORDER — BISACODYL 10 MG RE SUPP: 10.0000 mg | Freq: Every day | RECTAL | Status: DC | PRN

## 2011-11-02 MED ORDER — BUPIVACAINE HCL (PF) 0.5 % IJ SOLN
INTRAMUSCULAR | Status: DC | PRN
  Administered 2011-11-02: 12.5 mg

## 2011-11-02 MED ORDER — HYDROMORPHONE HCL PF 1 MG/ML IJ SOLN: 0.2500 mg | INTRAMUSCULAR | Status: DC | PRN

## 2011-11-02 MED ORDER — LACTATED RINGERS IV SOLN
INTRAVENOUS | Status: DC
  Administered 2011-11-02: 10:00:00 via INTRAVENOUS
  Administered 2011-11-02: 1000 mL via INTRAVENOUS

## 2011-11-02 MED ORDER — INSULIN ASPART PROT & ASPART (70-30 MIX) 100 UNIT/ML ~~LOC~~ SUSP
15.0000 [IU] | Freq: Two times a day (BID) | SUBCUTANEOUS | Status: DC
  Administered 2011-11-03 – 2011-11-05 (×5): 15 [IU] via SUBCUTANEOUS
  Filled 2011-11-02: qty 3

## 2011-11-02 MED ORDER — ACETAMINOPHEN 650 MG RE SUPP: 650.0000 mg | Freq: Four times a day (QID) | RECTAL | Status: DC | PRN

## 2011-11-02 MED ORDER — TRAMADOL HCL 50 MG PO TABS: 50.0000 mg | ORAL_TABLET | Freq: Four times a day (QID) | ORAL | Status: DC | PRN

## 2011-11-02 MED ORDER — PROMETHAZINE HCL 25 MG/ML IJ SOLN: 6.2500 mg | INTRAMUSCULAR | Status: DC | PRN

## 2011-11-02 MED ORDER — INSULIN NPH ISOPHANE & REGULAR (70-30) 100 UNIT/ML ~~LOC~~ SUSP: 15.0000 [IU] | Freq: Two times a day (BID) | SUBCUTANEOUS | Status: DC

## 2011-11-02 MED ORDER — METHOCARBAMOL 100 MG/ML IJ SOLN
500.0000 mg | Freq: Four times a day (QID) | INTRAVENOUS | Status: DC | PRN
  Filled 2011-11-02: qty 5

## 2011-11-02 MED ORDER — OXYCODONE HCL 5 MG PO TABS: 5.0000 mg | ORAL_TABLET | Freq: Once | ORAL | Status: DC | PRN

## 2011-11-02 MED ORDER — ONDANSETRON HCL 4 MG PO TABS: 4.0000 mg | ORAL_TABLET | Freq: Four times a day (QID) | ORAL | Status: DC | PRN

## 2011-11-02 MED ORDER — METHOCARBAMOL 500 MG PO TABS
500.0000 mg | ORAL_TABLET | Freq: Four times a day (QID) | ORAL | Status: DC | PRN
  Administered 2011-11-02 – 2011-11-04 (×4): 500 mg via ORAL
  Filled 2011-11-02 (×4): qty 1

## 2011-11-02 MED ORDER — FENTANYL CITRATE 0.05 MG/ML IJ SOLN
INTRAMUSCULAR | Status: DC | PRN
  Administered 2011-11-02 (×2): 50 ug via INTRAVENOUS

## 2011-11-02 MED ORDER — STERILE WATER FOR IRRIGATION IR SOLN
Status: DC | PRN
  Administered 2011-11-02: 3000 mL

## 2011-11-02 MED ORDER — ACETAMINOPHEN 10 MG/ML IV SOLN: 1000.0000 mg | Freq: Once | INTRAVENOUS | Status: DC | PRN

## 2011-11-02 MED ORDER — ACETAMINOPHEN 325 MG PO TABS: 650.0000 mg | ORAL_TABLET | Freq: Four times a day (QID) | ORAL | Status: DC | PRN

## 2011-11-02 MED ORDER — HYDROMORPHONE HCL PF 1 MG/ML IJ SOLN: 0.5000 mg | INTRAMUSCULAR | Status: DC | PRN

## 2011-11-02 MED ORDER — ACETAMINOPHEN 10 MG/ML IV SOLN: 1000.0000 mg | Freq: Once | INTRAVENOUS | Status: DC

## 2011-11-02 MED ORDER — MEPERIDINE HCL 50 MG/ML IJ SOLN: 6.2500 mg | INTRAMUSCULAR | Status: DC | PRN

## 2011-11-02 MED ORDER — OXYCODONE HCL 5 MG PO TABS
5.0000 mg | ORAL_TABLET | ORAL | Status: DC | PRN
  Administered 2011-11-02 – 2011-11-05 (×9): 10 mg via ORAL
  Filled 2011-11-02 (×10): qty 2

## 2011-11-02 MED ORDER — METOCLOPRAMIDE HCL 10 MG PO TABS: 5.0000 mg | ORAL_TABLET | Freq: Three times a day (TID) | ORAL | Status: DC | PRN

## 2011-11-02 MED ORDER — CEFAZOLIN SODIUM-DEXTROSE 2-3 GM-% IV SOLR
2.0000 g | INTRAVENOUS | Status: AC
  Administered 2011-11-02: 2 g via INTRAVENOUS

## 2011-11-02 MED ORDER — MENTHOL 3 MG MT LOZG: 1.0000 | LOZENGE | OROMUCOSAL | Status: DC | PRN

## 2011-11-02 MED ORDER — 0.9 % SODIUM CHLORIDE (POUR BTL) OPTIME
TOPICAL | Status: DC | PRN
  Administered 2011-11-02: 1000 mL

## 2011-11-02 MED ORDER — DIPHENHYDRAMINE HCL 12.5 MG/5ML PO ELIX: 12.5000 mg | ORAL_SOLUTION | ORAL | Status: DC | PRN

## 2011-11-02 MED ORDER — INSULIN ASPART 100 UNIT/ML ~~LOC~~ SOLN
0.0000 [IU] | Freq: Three times a day (TID) | SUBCUTANEOUS | Status: DC
  Administered 2011-11-02: 5 [IU] via SUBCUTANEOUS
  Administered 2011-11-03 – 2011-11-04 (×4): 3 [IU] via SUBCUTANEOUS

## 2011-11-02 MED ORDER — BUPIVACAINE LIPOSOME 1.3 % IJ SUSP
20.0000 mL | Freq: Once | INTRAMUSCULAR | Status: AC
  Administered 2011-11-02: 70 mL
  Filled 2011-11-02: qty 20

## 2011-11-02 MED ORDER — METOCLOPRAMIDE HCL 5 MG/ML IJ SOLN
5.0000 mg | Freq: Three times a day (TID) | INTRAMUSCULAR | Status: DC | PRN
  Administered 2011-11-02: 10 mg via INTRAVENOUS
  Filled 2011-11-02: qty 2

## 2011-11-02 MED ORDER — SODIUM CHLORIDE 0.9 % IV SOLN
INTRAVENOUS | Status: DC
  Administered 2011-11-02 – 2011-11-04 (×4): via INTRAVENOUS

## 2011-11-02 MED ORDER — FLEET ENEMA 7-19 GM/118ML RE ENEM: 1.0000 | ENEMA | Freq: Once | RECTAL | Status: AC | PRN

## 2011-11-02 MED ORDER — OXYCODONE HCL 5 MG/5ML PO SOLN
5.0000 mg | Freq: Once | ORAL | Status: DC | PRN
  Filled 2011-11-02: qty 5

## 2011-11-02 MED ORDER — EPHEDRINE SULFATE 50 MG/ML IJ SOLN
INTRAMUSCULAR | Status: DC | PRN
  Administered 2011-11-02 (×4): 10 mg via INTRAVENOUS

## 2011-11-02 MED ORDER — MIDAZOLAM HCL 5 MG/5ML IJ SOLN
INTRAMUSCULAR | Status: DC | PRN
  Administered 2011-11-02: 1 mg via INTRAVENOUS

## 2011-11-02 MED ORDER — ACETAMINOPHEN 10 MG/ML IV SOLN
INTRAVENOUS | Status: DC | PRN
  Administered 2011-11-02: 1000 mg via INTRAVENOUS

## 2011-11-02 MED ORDER — DOCUSATE SODIUM 100 MG PO CAPS
100.0000 mg | ORAL_CAPSULE | Freq: Two times a day (BID) | ORAL | Status: DC
  Administered 2011-11-02 – 2011-11-05 (×6): 100 mg via ORAL

## 2011-11-02 MED ORDER — ACETAMINOPHEN 10 MG/ML IV SOLN
1000.0000 mg | Freq: Four times a day (QID) | INTRAVENOUS | Status: AC
  Administered 2011-11-02 – 2011-11-03 (×4): 1000 mg via INTRAVENOUS
  Filled 2011-11-02 (×6): qty 100

## 2011-11-02 MED ORDER — RIVAROXABAN 10 MG PO TABS
10.0000 mg | ORAL_TABLET | Freq: Every day | ORAL | Status: DC
  Administered 2011-11-03 – 2011-11-05 (×3): 10 mg via ORAL
  Filled 2011-11-02 (×4): qty 1

## 2011-11-02 SURGICAL SUPPLY — 49 items
BAG ZIPLOCK 12X15 (MISCELLANEOUS) ×2 IMPLANT
BIT DRILL 2.8X128 (BIT) ×2 IMPLANT
BLADE EXTENDED COATED 6.5IN (ELECTRODE) ×2 IMPLANT
BLADE SAW SAG 73X25 THK (BLADE) ×1
BLADE SAW SGTL 73X25 THK (BLADE) ×1 IMPLANT
CLOTH BEACON ORANGE TIMEOUT ST (SAFETY) ×2 IMPLANT
DECANTER SPIKE VIAL GLASS SM (MISCELLANEOUS) ×2 IMPLANT
DRAPE INCISE IOBAN 66X45 STRL (DRAPES) ×2 IMPLANT
DRAPE ORTHO SPLIT 77X108 STRL (DRAPES) ×2
DRAPE POUCH INSTRU U-SHP 10X18 (DRAPES) ×2 IMPLANT
DRAPE SURG ORHT 6 SPLT 77X108 (DRAPES) ×2 IMPLANT
DRAPE U-SHAPE 47X51 STRL (DRAPES) ×2 IMPLANT
DRSG ADAPTIC 3X8 NADH LF (GAUZE/BANDAGES/DRESSINGS) ×2 IMPLANT
DRSG MEPILEX BORDER 4X4 (GAUZE/BANDAGES/DRESSINGS) ×2 IMPLANT
DRSG MEPILEX BORDER 4X8 (GAUZE/BANDAGES/DRESSINGS) ×2 IMPLANT
DURAPREP 26ML APPLICATOR (WOUND CARE) ×2 IMPLANT
ELECT REM PT RETURN 9FT ADLT (ELECTROSURGICAL) ×2
ELECTRODE REM PT RTRN 9FT ADLT (ELECTROSURGICAL) ×1 IMPLANT
EVACUATOR 1/8 PVC DRAIN (DRAIN) ×2 IMPLANT
FACESHIELD LNG OPTICON STERILE (SAFETY) ×8 IMPLANT
GLOVE BIO SURGEON STRL SZ8 (GLOVE) ×2 IMPLANT
GLOVE BIOGEL PI IND STRL 8 (GLOVE) ×2 IMPLANT
GLOVE BIOGEL PI INDICATOR 8 (GLOVE) ×2
GLOVE ECLIPSE 8.0 STRL XLNG CF (GLOVE) ×2 IMPLANT
GLOVE SURG SS PI 6.5 STRL IVOR (GLOVE) ×4 IMPLANT
GOWN STRL NON-REIN LRG LVL3 (GOWN DISPOSABLE) ×4 IMPLANT
GOWN STRL REIN XL XLG (GOWN DISPOSABLE) ×2 IMPLANT
IMMOBILIZER KNEE 20 (SOFTGOODS) ×2
IMMOBILIZER KNEE 20 THIGH 36 (SOFTGOODS) ×1 IMPLANT
KIT BASIN OR (CUSTOM PROCEDURE TRAY) ×2 IMPLANT
MANIFOLD NEPTUNE II (INSTRUMENTS) ×2 IMPLANT
NDL SAFETY ECLIPSE 18X1.5 (NEEDLE) ×1 IMPLANT
NEEDLE HYPO 18GX1.5 SHARP (NEEDLE) ×1
NS IRRIG 1000ML POUR BTL (IV SOLUTION) IMPLANT
PACK TOTAL JOINT (CUSTOM PROCEDURE TRAY) ×2 IMPLANT
PASSER SUT SWANSON 36MM LOOP (INSTRUMENTS) ×2 IMPLANT
POSITIONER SURGICAL ARM (MISCELLANEOUS) ×2 IMPLANT
SPONGE GAUZE 4X4 12PLY (GAUZE/BANDAGES/DRESSINGS) ×2 IMPLANT
STRIP CLOSURE SKIN 1/2X4 (GAUZE/BANDAGES/DRESSINGS) ×4 IMPLANT
SUT ETHIBOND NAB CT1 #1 30IN (SUTURE) ×4 IMPLANT
SUT MNCRL AB 4-0 PS2 18 (SUTURE) ×2 IMPLANT
SUT VIC AB 2-0 CT1 27 (SUTURE) ×3
SUT VIC AB 2-0 CT1 TAPERPNT 27 (SUTURE) ×3 IMPLANT
SUT VLOC 180 0 24IN GS25 (SUTURE) ×4 IMPLANT
SYR 50ML LL SCALE MARK (SYRINGE) ×2 IMPLANT
TOWEL OR 17X26 10 PK STRL BLUE (TOWEL DISPOSABLE) ×4 IMPLANT
TOWEL OR NON WOVEN STRL DISP B (DISPOSABLE) ×2 IMPLANT
TRAY FOLEY CATH 14FRSI W/METER (CATHETERS) ×2 IMPLANT
WATER STERILE IRR 1500ML POUR (IV SOLUTION) ×2 IMPLANT

## 2011-11-02 NOTE — Anesthesia Procedure Notes (Addendum)
Spinal  Patient location during procedure: OR Start time: 11/02/2011 9:50 AM End time: 11/02/2011 9:55 AM Staffing Anesthesiologist: Lewie Loron R Performed by: anesthesiologist  Preanesthetic Checklist Completed: patient identified, site marked, surgical consent, pre-op evaluation, timeout performed, IV checked, risks and benefits discussed and monitors and equipment checked Spinal Block Patient position: sitting Prep: ChloraPrep Patient monitoring: heart rate, continuous pulse ox and blood pressure Approach: right paramedian Location: L2-3 Injection technique: single-shot Needle Needle type: Quincke  Needle gauge: 22 G Needle length: 9 cm Needle insertion depth: 7 cm Assessment Sensory level: T6 Additional Notes Expiration date of kit checked and confirmed. Patient tolerated procedure well, without complications.

## 2011-11-02 NOTE — Progress Notes (Signed)
Anesthesia and Dr. Lequita Halt notified that patient drank 3 oz of black coffe at 5am.

## 2011-11-02 NOTE — Progress Notes (Signed)
Utilization review completed.  

## 2011-11-02 NOTE — Anesthesia Preprocedure Evaluation (Addendum)
Anesthesia Evaluation  Patient identified by MRN, date of birth, ID band Patient awake    Reviewed: Allergy & Precautions, H&P , NPO status , Patient's Chart, lab work & pertinent test results  Airway Mallampati: II TM Distance: >3 FB Neck ROM: Full    Dental  (+) Dental Advisory Given   Pulmonary neg pulmonary ROS,  breath sounds clear to auscultation  Pulmonary exam normal       Cardiovascular - CAD Rhythm:Regular Rate:Normal     Neuro/Psych negative neurological ROS  negative psych ROS   GI/Hepatic negative GI ROS, Neg liver ROS,   Endo/Other  diabetes, Type 2, Insulin Dependent  Renal/GU negative Renal ROS     Musculoskeletal  (+) Arthritis -, Osteoarthritis,    Abdominal   Peds  Hematology negative hematology ROS (+)   Anesthesia Other Findings   Reproductive/Obstetrics                         Anesthesia Physical Anesthesia Plan  ASA: III  Anesthesia Plan: Spinal   Post-op Pain Management:    Induction:   Airway Management Planned:   Additional Equipment:   Intra-op Plan:   Post-operative Plan:   Informed Consent: I have reviewed the patients History and Physical, chart, labs and discussed the procedure including the risks, benefits and alternatives for the proposed anesthesia with the patient or authorized representative who has indicated his/her understanding and acceptance.   Dental advisory given  Plan Discussed with: CRNA  Anesthesia Plan Comments:         Anesthesia Quick Evaluation

## 2011-11-02 NOTE — Anesthesia Postprocedure Evaluation (Signed)
Anesthesia Post Note  Patient: [name]  Procedure(s) Performed: Procedure(s) (LRB): TOTAL HIP ARTHROPLASTY (Left)  Anesthesia type: Spinal  Patient location: PACU  Post pain: Pain level controlled  Post assessment: Post-op Vital signs reviewed  Last Vitals: BP 164/74  Pulse 88  Temp 36.4 C (Oral)  Resp 16  Ht 5\' 11"  (1.803 m)  Wt 199 lb (90.266 kg)  BMI 27.75 kg/m2  SpO2 100%  Post vital signs: Reviewed  Level of consciousness: sedated  Complications: Slow recovery of motor function from spinal. Moving bilat LE adequately prior to d/c. No apparent anesthesia complications

## 2011-11-02 NOTE — Op Note (Signed)
Pre-operative diagnosis- Osteoarthritis Left hip  Post-operative diagnosis- Osteoarthritis  Left hip  Procedure-  LeftTotal Hip Arthroplasty  Surgeon- Gus Rankin. Oneka Parada, MD  Assistant- Leilani Able, PA-C   Anesthesia  Spinal  EBL- 250   Drain Hemovac   Complication- None  Condition-PACU - hemodynamically stable.   Brief Clinical Note- [name] is a 78 y.o. male with end stage arthritis of his left hip with progressively worsening pain and dysfunction. Pain occurs with activity and rest including pain at night. He has tried analgesics, protected weight bearing and rest without benefit. Pain is too severe to attempt physical therapy. Radiographs demonstrate bone on bone arthritis with subchondral cyst formation. He presents now for left THA.  Procedure in detail-   The patient is brought into the operating room and placed on the operating table. After successful administration of Spinal  anesthesia, the patient is placed in the  Right lateral decubitus position with the  Left side up and held in place with the hip positioner. The lower extremity is isolated from the perineum with plastic drapes and time-out is performed by the surgical team. The lower extremity is then prepped and draped in the usual sterile fashion. A short posterolateral incision is made with a ten blade through the subcutaneous tissue to the level of the fascia lata which is incised in line with the skin incision. The sciatic nerve is palpated and protected and the short external rotators and capsule are isolated from the femur. The hip is then dislocated and the center of the femoral head is marked. A trial prosthesis is placed such that the trial head corresponds to the center of the patients' native femoral head. The resection level is marked on the femoral neck and the resection is made with an oscillating saw. The femoral head is removed and femoral retractors placed to gain access to the femoral canal.  The canal finder is passed into the femoral canal and the canal is thoroughly irrigated with sterile saline to remove the fatty contents. Axial reaming is performed to 13.5  mm, proximal reaming to 18 F  and the sleeve machined to a large. A 18 F large trial sleeve is placed into the proximal femur.      The femur is then retracted anteriorly to gain acetabular exposure. Acetabular retractors are placed and the labrum and osteophytes are removed, Acetabular reaming is performed to 55  mm and a 56  mm Pinnacle acetabular shell is placed in anatomic position with excellent purchase. Additional dome screws were not needed. An apex hole eliminator is placed and the permanent 36 mm neutral + 4 Marathon liner is placed into the acetabular shell.      The trial femur is then placed into the femoral canal. The size is 18 x 13  stem with a 36 + 12  neck and a 36 + 6 head with the neck version matching  the patients' native anteversion. The hip is reduced with excellent stability with full extension and full external rotation, 70 degrees flexion with 40 degrees adduction and 90 degrees internal rotation and 90 degrees of flexion with 70 degrees of internal rotation. The operative leg is placed on top of the non-operative leg and the leg lengths are found to be equal. The trials are then removed and the permanent implant of the same size is impacted into the femoral canal. The metal femoral head of the same size as the trial is placed and the hip is reduced with the  same stability parameters. The operative leg is again placed on top of the non-operative leg and the leg lengths are found to be equal.      The wound is then copiously irrigated with saline solution and the capsule and short external rotators are re-attached to the femur through drill holes with Ethibond suture. The fascia lata is closed over a hemovac drain with #1 vicryl suture and the fascia lata, gluteal muscles and subcutaneous tissues are injected with  Exparel 20ml diluted with saline 50ml. The subcutaneous tissues are closed with #1 and2-0 vicryl and the subcuticular layer closed with running 4-0 Monocryl. The drain is hooked to suction, incision cleaned and dried, and steri-srips and a bulky sterile dressing applied. The limb is placed into a knee immobilizer and the patient is awakened and transported to recovery in stable condition.      Please note that a surgical assistant was a medical necessity for this procedure in order to perform it in a safe and expeditious manner. The assistant was necessary to provide retraction to the vital neurovascular structures and to retract and position the limb to allow for anatomic placement of the prosthetic components.  Gus Rankin Rahkim Rabalais, MD    11/02/2011, 11:05 AM

## 2011-11-02 NOTE — Transfer of Care (Signed)
Immediate Anesthesia Transfer of Care Note  Patient: [name]  Procedure(s) Performed: Procedure(s) (LRB) with comments: TOTAL HIP ARTHROPLASTY (Left)  Patient Location: PACU  Anesthesia Type: Spinal  Level of Consciousness: awake, alert  and oriented  Airway & Oxygen Therapy: Patient Spontanous Breathing and Patient connected to face mask oxygen  Post-op Assessment: Report given to PACU RN and Post -op Vital signs reviewed and stable  Post vital signs: Reviewed and stable  Complications: No apparent anesthesia complications

## 2011-11-02 NOTE — Preoperative (Signed)
Beta Blockers   Reason not to administer Beta Blockers:Not Applicable 

## 2011-11-02 NOTE — Progress Notes (Signed)
PACU Nsg Note; Pt hemodynamically stable, denies any pain or discomfort at surgical site, alert and oriented x 3, spinal level at S1 for sensory, pt unable to hold non-operative leg up and still no motor noted of Rt or Lt foot, MDA notified of nsg assessment, at bedside to discuss findings, will cont to monitor patient for additional time in Phase I PACU

## 2011-11-02 NOTE — Interval H&P Note (Signed)
History and Physical Interval Note:  11/02/2011 8:51 AM  [name]  has presented today for surgery, with the diagnosis of Osteoarthritis of the left hip  The various methods of treatment have been discussed with the patient and family. After consideration of risks, benefits and other options for treatment, the patient has consented to  Procedure(s) (LRB) with comments: TOTAL HIP ARTHROPLASTY (Left) as a surgical intervention .  The patient's history has been reviewed, patient examined, no change in status, stable for surgery.  I have reviewed the patient's chart and labs.  Questions were answered to the patient's satisfaction.     Loanne Drilling

## 2011-11-03 ENCOUNTER — Encounter (HOSPITAL_COMMUNITY): Payer: Self-pay | Admitting: Orthopedic Surgery

## 2011-11-03 LAB — GLUCOSE, CAPILLARY
Glucose-Capillary: 157 mg/dL — ABNORMAL HIGH (ref 70–99)
Glucose-Capillary: 62 mg/dL — ABNORMAL LOW (ref 70–99)
Glucose-Capillary: 66 mg/dL — ABNORMAL LOW (ref 70–99)
Glucose-Capillary: 69 mg/dL — ABNORMAL LOW (ref 70–99)

## 2011-11-03 LAB — CBC
HCT: 25.9 % — ABNORMAL LOW (ref 39.0–52.0)
Hemoglobin: 8.7 g/dL — ABNORMAL LOW (ref 13.0–17.0)
MCH: 32.5 pg (ref 26.0–34.0)
MCV: 96.6 fL (ref 78.0–100.0)
RBC: 2.68 MIL/uL — ABNORMAL LOW (ref 4.22–5.81)
WBC: 5.3 10*3/uL (ref 4.0–10.5)

## 2011-11-03 LAB — BASIC METABOLIC PANEL
BUN: 22 mg/dL (ref 6–23)
CO2: 27 mEq/L (ref 19–32)
Calcium: 8.4 mg/dL (ref 8.4–10.5)
Chloride: 100 mEq/L (ref 96–112)
Creatinine, Ser: 1.16 mg/dL (ref 0.50–1.35)
Glucose, Bld: 251 mg/dL — ABNORMAL HIGH (ref 70–99)

## 2011-11-03 MED ORDER — GLUCOSE-VITAMIN C 4-6 GM-MG PO CHEW
CHEWABLE_TABLET | ORAL | Status: AC
  Filled 2011-11-03: qty 1

## 2011-11-03 MED ORDER — SODIUM CHLORIDE 0.9 % IV BOLUS (SEPSIS)
500.0000 mL | Freq: Once | INTRAVENOUS | Status: AC
  Administered 2011-11-03: 500 mL via INTRAVENOUS

## 2011-11-03 MED FILL — Neomycin-Bacitracin-Polymyxin Oint: CUTANEOUS | Qty: 30 | Status: AC

## 2011-11-03 NOTE — Progress Notes (Signed)
Clinical Social Work Department CLINICAL SOCIAL WORK PLACEMENT NOTE 11/03/2011  Patient:  [name], [name]  Account Number:  [id number] Admit date:  11/02/2011  Clinical Social Worker:  Cori Razor, LCSW  Date/time:  11/03/2011 03:07 PM  Clinical Social Work is seeking post-discharge placement for this patient at the following level of care:   SKILLED NURSING   (*CSW will update this form in Epic as items are completed)   11/03/2011  Patient/family provided with [facility] Department of Clinical Social Work's list of facilities offering this level of care within the geographic area requested by the patient (or if unable, by the patient's family).  11/03/2011  Patient/family informed of their freedom to choose among providers that offer the needed level of care, that participate in Medicare, Medicaid or managed care program needed by the patient, have an available bed and are willing to accept the patient.  11/03/2011  Patient/family informed of [facility]' ownership interest in [facility], as well as of the fact that they are under no obligation to receive care at this facility.  PASARR submitted to EDS on 11/03/2011 PASARR number received from EDS on 11/03/2011  FL2 transmitted to all facilities in geographic area requested by pt/family on  11/03/2011 FL2 transmitted to all facilities within larger geographic area on   Patient informed that his/her managed care company has contracts with or will negotiate with  certain facilities, including the following:     Patient/family informed of bed offers received:  11/03/2011 Patient chooses bed at [facility] Physician recommends and patient chooses bed at    Patient to be transferred to [facility] on   Patient to be transferred to facility by   The following physician request were entered in Epic:   Additional Comments:  Cori Razor LCSW [phone]

## 2011-11-03 NOTE — Progress Notes (Signed)
Clinical Social Work Department BRIEF PSYCHOSOCIAL ASSESSMENT 11/03/2011  Patient:  SNAYDER, CADE     Account Number:  [id number]     Admit date:  11/02/2011  Clinical Social Worker:  Candie Chroman  Date/Time:  11/03/2011 03:01 PM  Referred by:  Physician  Date Referred:  11/03/2011 Referred for  SNF Placement   Other Referral:   Interview type:  Patient Other interview type:    PSYCHOSOCIAL DATA Living Status:  ALONE Admitted from facility:   Level of care:   Primary support name:  Lynne Logan Primary support relationship to patient:  CHILD, ADULT Degree of support available:   unclear    CURRENT CONCERNS Current Concerns  Post-Acute Placement   Other Concerns:    SOCIAL WORK ASSESSMENT / PLAN Pt is a 78 yr old gentleman living at home prior to hospitalization. CSW met with pt to assist with d/c planning. ST SNF placement is needed following hospital d/c. Pt requested placement at [facility]. SNF search initiated and bed offer from [facility] received. CSW will assist with d/c planning to SNF.   Assessment/plan status:  Psychosocial Support/Ongoing Assessment of Needs Other assessment/ plan:   Information/referral to community resources:   SNF list provided.    PATIENT'S/FAMILY'S RESPONSE TO PLAN OF CARE: Pt feels rehab will be helpful following hospital d/c.   Cori Razor LCSW [phone]

## 2011-11-03 NOTE — Evaluation (Signed)
Physical Therapy Evaluation Patient Details Name: [name] MRN: [id number] DOB: October 27, 1932 Today's Date: 11/03/2011 Time: 3086-5784 PT Time Calculation (min): 37 min  PT Assessment / Plan / Recommendation Clinical Impression  Pt s/p L THR presents with decreased L LE strength/ROM limiting functional mobility    PT Assessment  Patient needs continued PT services    Follow Up Recommendations  Post acute inpatient    Does the patient have the potential to tolerate intense rehabilitation   No, Recommend SNF  Barriers to Discharge Decreased caregiver support      Equipment Recommendations  None recommended by PT    Recommendations for Other Services OT consult   Frequency 7X/week    Precautions / Restrictions Precautions Precautions: Posterior Hip Precaution Comments: sign hung in room Restrictions Weight Bearing Restrictions: No Other Position/Activity Restrictions: wbat   Pertinent Vitals/Pain 5/10; premedicated, ice pack provided.      Mobility  Bed Mobility Bed Mobility: Supine to Sit Supine to Sit: 4: Min assist;With rails Details for Bed Mobility Assistance: cues for sequence and for adherence to THP Transfers Transfers: Sit to Stand;Stand to Sit Sit to Stand: 3: Mod assist Stand to Sit: 3: Mod assist Details for Transfer Assistance: cues for LE management and use of UES to self assist Ambulation/Gait Ambulation/Gait Assistance: 3: Mod assist Ambulation Distance (Feet): 118 Feet Assistive device: Rolling walker Ambulation/Gait Assistance Details: cues for sequence, posture, position from RW, ER on L, and stride length Gait Pattern: Step-to pattern    Shoulder Instructions     Exercises Total Joint Exercises Ankle Circles/Pumps: AROM;10 reps;Supine;Both Quad Sets: AROM;Both;10 reps;Supine Heel Slides: AAROM;10 reps;Supine;Left Hip ABduction/ADduction: AAROM;10 reps;Left;Supine   PT Diagnosis: Difficulty walking  PT Problem List: Decreased  strength;Decreased range of motion;Decreased activity tolerance;Decreased knowledge of use of DME;Decreased mobility;Decreased knowledge of precautions;Pain PT Treatment Interventions: DME instruction;Gait training;Stair training;Functional mobility training;Therapeutic activities;Therapeutic exercise;Patient/family education   PT Goals Acute Rehab PT Goals PT Goal Formulation: With patient Time For Goal Achievement: 11/08/11 Potential to Achieve Goals: Good Pt will go Supine/Side to Sit: with supervision PT Goal: Supine/Side to Sit - Progress: Goal set today Pt will go Sit to Supine/Side: with supervision PT Goal: Sit to Supine/Side - Progress: Goal set today Pt will go Sit to Stand: with supervision PT Goal: Sit to Stand - Progress: Goal set today Pt will go Stand to Sit: with supervision PT Goal: Stand to Sit - Progress: Goal set today Pt will Ambulate: 51 - 150 feet;with supervision;with rolling walker PT Goal: Ambulate - Progress: Goal set today  Visit Information  Last PT Received On: 11/03/11 Assistance Needed: +1    Subjective Data  Subjective: I was having a lot of trouble moving around before surgery Patient Stated Goal: Resume previous lifestyle with decreased pain   Prior Functioning  Home Living Lives With: Alone Prior Function Level of Independence: Needs assistance Needs Assistance: [employer] Communication Communication: No difficulties    Cognition  Overall Cognitive Status: Appears within functional limits for tasks assessed/performed Arousal/Alertness: Awake/alert Orientation Level: Appears intact for tasks assessed Behavior During Session: [facility] for tasks performed    Extremity/Trunk Assessment Right Upper Extremity Assessment RUE ROM/Strength/Tone: [facility] for tasks assessed Left Upper Extremity Assessment LUE ROM/Strength/Tone: WFL for tasks assessed Right Lower Extremity Assessment RLE ROM/Strength/Tone: [facility] for tasks assessed Left Lower  Extremity Assessment LLE ROM/Strength/Tone: Deficits LLE ROM/Strength/Tone Deficits: Hip strength 2+/5 with AAROM at hip to 85 flex and 20 abd   Balance    End of Session PT -  End of Session Equipment Utilized During Treatment: Gait belt Activity Tolerance: Patient tolerated treatment well;Patient limited by fatigue Patient left: in chair;with call bell/phone within reach Nurse Communication: Mobility status  GP     [name] 11/03/2011, 1:02 PM

## 2011-11-03 NOTE — Progress Notes (Signed)
   Subjective: 1 Day Post-Op Procedure(s) (LRB): TOTAL HIP ARTHROPLASTY (Left) Patient reports pain as mild.   Patient seen in rounds by Dr. Lequita Halt. Patient is well, and has had no acute complaints or problems.  He has been running low pressure.  Will give fluids and monitor his progress today with therapy. We will start therapy today.    Objective: Vital signs in last 24 hours: Temp:  [96.6 F (35.9 C)-98.2 F (36.8 C)] 97.9 F (36.6 C) (10/24 1610) Pulse Rate:  [62-100] 62  (10/24 0638) Resp:  [16-25] 16  (10/24 0638) BP: (92-164)/(50-88) 105/52 mmHg (10/24 0638) SpO2:  [91 %-100 %] 100 % (10/24 9604) Weight:  [90.266 kg (199 lb)] 90.266 kg (199 lb) (10/23 1411)  Intake/Output from previous day:  Intake/Output Summary (Last 24 hours) at 11/03/11 0715 Last data filed at 11/03/11 0639  Gross per 24 hour  Intake 4328.33 ml  Output   3200 ml  Net 1128.33 ml    Intake/Output this shift:    Labs:  Basename 11/03/11 0403  HGB 8.7*    Basename 11/03/11 0403  WBC 5.3  RBC 2.68*  HCT 25.9*  PLT 175    Basename 11/03/11 0403  NA 132*  K 4.8  CL 100  CO2 27  BUN 22  CREATININE 1.16  GLUCOSE 251*  CALCIUM 8.4   No results found for this basename: LABPT:2,INR:2 in the last 72 hours  EXAM General - Patient is Alert, Appropriate and Oriented Extremity - Neurovascular intact Sensation intact distally Dorsiflexion/Plantar flexion intact Dressing - dressing C/D/I Motor Function - intact, moving foot and toes well on exam.  Hemovac pulled without difficulty.  Past Medical History  Diagnosis Date  . Diabetes mellitus without complication     takes lisinopril for kidneys  . Arthritis   . S/P knee replacement left 2011, right 2012    Assessment/Plan: 1 Day Post-Op Procedure(s) (LRB): TOTAL HIP ARTHROPLASTY (Left) Principal Problem:  *OA (osteoarthritis) of hip  Estimated Body mass index is 27.75 kg/(m^2) as calculated from the following:   Height as of  this encounter: 5\' 11" (1.803 m).   Weight as of this encounter: 199 lb(90.266 kg). Advance diet Up with therapy  DVT Prophylaxis - Xarelto, ASA 81 mg on hold for now Weight Bearing As Tolerated left Leg D/C Knee Immobilizer Hemovac Pulled Begin Therapy Hip Preacutions No vaccines.  Neeko Pharo 11/03/2011, 7:15 AM

## 2011-11-03 NOTE — Progress Notes (Signed)
Physical Therapy Treatment Patient Details Name: [name] MRN: [id number] DOB: 02/28/1932 Today's Date: 11/03/2011 Time: 4098-1191 PT Time Calculation (min): 23 min  PT Assessment / Plan / Recommendation Comments on Treatment Session       Follow Up Recommendations  Post acute inpatient     Does the patient have the potential to tolerate intense rehabilitation  No, Recommend SNF  Barriers to Discharge        Equipment Recommendations  None recommended by PT    Recommendations for Other Services OT consult  Frequency 7X/week   Plan Discharge plan remains appropriate    Precautions / Restrictions Precautions Precautions: Posterior Hip Precaution Comments: pt recalls 1/3 THP without cues Restrictions Weight Bearing Restrictions: No Other Position/Activity Restrictions: wbat   Pertinent Vitals/Pain 4/10; premedicated    Mobility  Bed Mobility Bed Mobility: Sit to Supine Sit to Supine: 4: Min assist Details for Bed Mobility Assistance: cues for sequence and for adherence to THP Transfers Transfers: Sit to Stand;Stand to Sit Sit to Stand: 3: Mod assist Stand to Sit: 4: Min assist;3: Mod assist Details for Transfer Assistance: cues for LE management and use of UES to self assist Ambulation/Gait Ambulation/Gait Assistance: 4: Min assist;3: Mod assist Ambulation Distance (Feet): 75 Feet Assistive device: Rolling walker Ambulation/Gait Assistance Details: cues for posture, sequence, position from RW and ER on L Gait Pattern: Step-to pattern    Exercises     PT Diagnosis:    PT Problem List:   PT Treatment Interventions:     PT Goals Acute Rehab PT Goals PT Goal Formulation: With patient Time For Goal Achievement: 11/08/11 Potential to Achieve Goals: Good Pt will go Supine/Side to Sit: with supervision PT Goal: Supine/Side to Sit - Progress: Progressing toward goal Pt will go Sit to Supine/Side: with supervision PT Goal: Sit to Supine/Side - Progress:  Progressing toward goal Pt will go Sit to Stand: with supervision PT Goal: Sit to Stand - Progress: Progressing toward goal Pt will go Stand to Sit: with supervision PT Goal: Stand to Sit - Progress: Progressing toward goal Pt will Ambulate: 51 - 150 feet;with supervision;with rolling walker PT Goal: Ambulate - Progress: Progressing toward goal  Visit Information  Last PT Received On: 11/03/11 Assistance Needed: +1    Subjective Data  Subjective: This is better than this morning Patient Stated Goal: Resume previous lifestyle with decreased pain   Cognition  Overall Cognitive Status: Appears within functional limits for tasks assessed/performed Arousal/Alertness: Awake/alert Orientation Level: Appears intact for tasks assessed Behavior During Session: [facility] for tasks performed    Balance     End of Session PT - End of Session Equipment Utilized During Treatment: Gait belt Activity Tolerance: Patient tolerated treatment well;Patient limited by fatigue Patient left: in bed;with call bell/phone within reach Nurse Communication: Mobility status   GP     [name] 11/03/2011, 2:38 PM

## 2011-11-04 DIAGNOSIS — E871 Hypo-osmolality and hyponatremia: Secondary | ICD-10-CM

## 2011-11-04 DIAGNOSIS — D62 Acute posthemorrhagic anemia: Secondary | ICD-10-CM

## 2011-11-04 LAB — GLUCOSE, CAPILLARY
Glucose-Capillary: 165 mg/dL — ABNORMAL HIGH (ref 70–99)
Glucose-Capillary: 94 mg/dL (ref 70–99)

## 2011-11-04 LAB — CBC
HCT: 23.3 % — ABNORMAL LOW (ref 39.0–52.0)
Hemoglobin: 8 g/dL — ABNORMAL LOW (ref 13.0–17.0)
MCH: 33.2 pg (ref 26.0–34.0)
MCHC: 34.3 g/dL (ref 30.0–36.0)
RBC: 2.41 MIL/uL — ABNORMAL LOW (ref 4.22–5.81)

## 2011-11-04 LAB — BASIC METABOLIC PANEL
BUN: 22 mg/dL (ref 6–23)
CO2: 26 mEq/L (ref 19–32)
Calcium: 8.7 mg/dL (ref 8.4–10.5)
GFR calc non Af Amer: 55 mL/min — ABNORMAL LOW (ref >90)
Glucose, Bld: 139 mg/dL — ABNORMAL HIGH (ref 70–99)
Sodium: 130 mEq/L — ABNORMAL LOW (ref 135–145)

## 2011-11-04 MED ORDER — POLYETHYLENE GLYCOL 3350 17 G PO PACK: 17.0000 g | PACK | Freq: Every day | ORAL | Status: DC | PRN

## 2011-11-04 MED ORDER — RIVAROXABAN 10 MG PO TABS: 10.0000 mg | ORAL_TABLET | Freq: Every day | ORAL | Status: DC

## 2011-11-04 MED ORDER — LISINOPRIL 2.5 MG PO TABS: 1.2500 mg | ORAL_TABLET | Freq: Every morning | ORAL | Status: DC

## 2011-11-04 MED ORDER — DSS 100 MG PO CAPS: 100.0000 mg | ORAL_CAPSULE | Freq: Two times a day (BID) | ORAL | Status: DC

## 2011-11-04 MED ORDER — TRAMADOL HCL 50 MG PO TABS: 50.0000 mg | ORAL_TABLET | Freq: Four times a day (QID) | ORAL | Status: DC | PRN

## 2011-11-04 MED ORDER — ACETAMINOPHEN 325 MG PO TABS: 650.0000 mg | ORAL_TABLET | Freq: Four times a day (QID) | ORAL | Status: DC | PRN

## 2011-11-04 MED ORDER — METHOCARBAMOL 500 MG PO TABS: 500.0000 mg | ORAL_TABLET | Freq: Four times a day (QID) | ORAL | Status: DC | PRN

## 2011-11-04 MED ORDER — BISACODYL 10 MG RE SUPP: 10.0000 mg | Freq: Every day | RECTAL | Status: DC | PRN

## 2011-11-04 MED ORDER — ONDANSETRON HCL 4 MG PO TABS: 4.0000 mg | ORAL_TABLET | Freq: Four times a day (QID) | ORAL | Status: DC | PRN

## 2011-11-04 MED ORDER — OXYCODONE HCL 5 MG PO TABS: 5.0000 mg | ORAL_TABLET | ORAL | Status: DC | PRN

## 2011-11-04 MED ORDER — SODIUM CHLORIDE 0.9 % IV BOLUS (SEPSIS)
500.0000 mL | Freq: Once | INTRAVENOUS | Status: AC
  Administered 2011-11-04: 500 mL via INTRAVENOUS

## 2011-11-04 NOTE — Progress Notes (Signed)
Physical Therapy Treatment Patient Details Name: [name] MRN: [id number] DOB: Dec 28, 1932 Today's Date: 11/04/2011 Time: 0865-7846 PT Time Calculation (min): 18 min  PT Assessment / Plan / Recommendation Comments on Treatment Session       Follow Up Recommendations  Post acute inpatient     Does the patient have the potential to tolerate intense rehabilitation  No, Recommend SNF  Barriers to Discharge        Equipment Recommendations  3 in 1 bedside comode;None recommended by PT    Recommendations for Other Services OT consult  Frequency 7X/week   Plan Discharge plan remains appropriate    Precautions / Restrictions Precautions Precautions: Posterior Hip Precaution Comments: pt recalled all THP without cues Restrictions Weight Bearing Restrictions: No Other Position/Activity Restrictions: wbat   Pertinent Vitals/Pain 5/10 with activity    Mobility  Bed Mobility Bed Mobility: Supine to Sit Sit to Supine: 4: Min assist Details for Bed Mobility Assistance: cues for sequence and for adherence to THP Transfers Transfers: Sit to Stand;Stand to Sit Sit to Stand: 4: Min assist;3: Mod assist;With armrests;From chair/3-in-1 Stand to Sit: 4: Min assist;3: Mod assist;To bed Details for Transfer Assistance: cues for LE management and use of UES to self assist Ambulation/Gait Ambulation/Gait Assistance: 4: Min assist;3: Mod assist Ambulation Distance (Feet): 121 Feet Assistive device: Rolling walker Ambulation/Gait Assistance Details: cues for posture and position from RW Gait Pattern: Step-to pattern    Exercises     PT Diagnosis:    PT Problem List:   PT Treatment Interventions:     PT Goals Acute Rehab PT Goals PT Goal Formulation: With patient Time For Goal Achievement: 11/08/11 Potential to Achieve Goals: Good Pt will go Supine/Side to Sit: with supervision PT Goal: Supine/Side to Sit - Progress: Progressing toward goal Pt will go Sit to Supine/Side:  with supervision PT Goal: Sit to Supine/Side - Progress: Progressing toward goal Pt will go Sit to Stand: with supervision PT Goal: Sit to Stand - Progress: Progressing toward goal Pt will go Stand to Sit: with supervision PT Goal: Stand to Sit - Progress: Progressing toward goal Pt will Ambulate: 51 - 150 feet;with supervision;with rolling walker PT Goal: Ambulate - Progress: Progressing toward goal  Visit Information  Last PT Received On: 11/04/11 Assistance Needed: +1    Subjective Data  Subjective: My back is bothering me more so I can't stand up very straight Patient Stated Goal: Resume previous lifestyle with decreased pain   Cognition  Overall Cognitive Status: Appears within functional limits for tasks assessed/performed Area of Impairment: Attention Arousal/Alertness: Awake/alert Orientation Level: Appears intact for tasks assessed Behavior During Session: [facility] for tasks performed Current Attention Level: Selective Cognition - Other Comments: distracts himself/talks a lot    Balance     End of Session PT - End of Session Equipment Utilized During Treatment: Gait belt Activity Tolerance: Patient tolerated treatment well;Patient limited by fatigue Patient left: in bed;with call bell/phone within reach Nurse Communication: Mobility status   GP     Velora Horstman 11/04/2011, 3:55 PM

## 2011-11-04 NOTE — Care Management Note (Addendum)
    Page 1 of 2   11/05/2011     7:41:31 PM   CARE MANAGEMENT NOTE 11/05/2011  Patient:  [name], [name]   Account Number:  [id number]  Date Initiated:  11/04/2011  Documentation initiated by:  Colleen Can  Subjective/Objective Assessment:   DX Osteoarthritis left hip; total hip replacemnt     Action/Plan:   SNF rehab   Anticipated DC Date:  11/05/2011   Anticipated DC Plan:  SKILLED NURSING FACILITY  In-house referral  Clinical Social Worker      DC Planning Services  CM consult      [facility] Choice  NA   Choice offered to / List presented to:  NA   DME arranged  NA      DME agency  NA     HH arranged  NA      HH agency  NA   Status of service:  Completed, signed off Medicare Important Message given?  NA - LOS <3 / Initial given by admissions (If response is "NO", the following Medicare IM given date fields will be blank) Date Medicare IM given:   Date Additional Medicare IM given:    Discharge Disposition:  SKILLED NURSING FACILITY  Per UR Regulation:    If discussed at Long Length of Stay Meetings, dates discussed:    Comments:  11/05/11 [name] RN,BSN NCM [phone] D/C SNF.

## 2011-11-04 NOTE — Progress Notes (Signed)
   Subjective: 2 Days Post-Op Procedure(s) (LRB): TOTAL HIP ARTHROPLASTY (Left) Patient reports pain as mild.   Patient seen in rounds for Dr. Lequita Halt.  He had a low blood sugar late last night but was treated by the staff.  Sore today in the hip but overall stable.  Still running low pressures but asymptomatic at this time. Will give fluids this morning, recheck pressures, monitor for changes.  HGB is down to 8.0 from 8.7 yesterday.  He started at 12.8.  If becomes symptomatic, may need blood.  Patient is well, and has had no acute complaints or problems Plan is to go Skilled nursing facility after hospital stay.  He will have a bed at [facility] tomorrow.  Objective: Vital signs in last 24 hours: Temp:  [98.3 F (36.8 C)-98.6 F (37 C)] 98.4 F (36.9 C) (10/25 0605) Pulse Rate:  [66-75] 67  (10/25 0605) Resp:  [16-20] 20  (10/25 0605) BP: (108-133)/(54-65) 108/54 mmHg (10/25 0605) SpO2:  [94 %-100 %] 94 % (10/25 0605)  Intake/Output from previous day:  Intake/Output Summary (Last 24 hours) at 11/04/11 0847 Last data filed at 11/04/11 0819  Gross per 24 hour  Intake   2397 ml  Output    975 ml  Net   1422 ml    Intake/Output this shift: Total I/O In: 240 [P.O.:240] Out: -   Labs:  Basename 11/04/11 0440 11/03/11 0403  HGB 8.0* 8.7*    Basename 11/04/11 0440 11/03/11 0403  WBC 6.7 5.3  RBC 2.41* 2.68*  HCT 23.3* 25.9*  PLT 166 175    Basename 11/04/11 0440 11/03/11 0403  NA 130* 132*  K 4.6 4.8  CL 99 100  CO2 26 27  BUN 22 22  CREATININE 1.22 1.16  GLUCOSE 139* 251*  CALCIUM 8.7 8.4   No results found for this basename: LABPT:2,INR:2 in the last 72 hours  EXAM General - Patient is Alert, Appropriate and Oriented Extremity - Neurovascular intact Sensation intact distally Dorsiflexion/Plantar flexion intact No cellulitis present Dressing/Incision - clean, dry, no drainage, healing Motor Function - intact, moving foot and toes well on exam.   Past  Medical History  Diagnosis Date  . Diabetes mellitus without complication     takes lisinopril for kidneys  . Arthritis   . S/P knee replacement left 2011, right 2012    Assessment/Plan: 2 Days Post-Op Procedure(s) (LRB): TOTAL HIP ARTHROPLASTY (Left) Principal Problem:  *OA (osteoarthritis) of hip Active Problems:  Postop Acute blood loss anemia  Postop Hyponatremia  Estimated Body mass index is 27.75 kg/(m^2) as calculated from the following:   Height as of this encounter: 5\' 11" (1.803 m).   Weight as of this encounter: 199 lb(90.266 kg). Up with therapy Plan for discharge tomorrow Discharge to SNF [facility]  DVT Prophylaxis - Xarelto, ASA 81 mg on hold for now Weight Bearing As Tolerated left Leg  Diet - Diabetic diet Follow up - in 2 weeks Activity - WBAT Disposition - Skilled nursing facility Condition Upon Discharge - Pending, Plan is to go to SNF tomorrow if doing well. D/C Meds - See DC Summary  PERKINS, ALEXZANDREW 11/04/2011, 8:47 AM

## 2011-11-04 NOTE — Progress Notes (Signed)
Physical Therapy Treatment Patient Details Name: [name] MRN: [id number] DOB: [birth year] Today's Date: 11/04/2011 Time: 4098-1191 PT Time Calculation (min): 18 min  PT Assessment / Plan / Recommendation Comments on Treatment Session       Follow Up Recommendations  Post acute inpatient     Does the patient have the potential to tolerate intense rehabilitation  No, Recommend SNF  Barriers to Discharge        Equipment Recommendations  3 in 1 bedside comode;None recommended by PT    Recommendations for Other Services OT consult  Frequency 7X/week   Plan Discharge plan remains appropriate    Precautions / Restrictions Precautions Precautions: Posterior Hip Precaution Comments: pt recalled all THP without cues Restrictions Weight Bearing Restrictions: No Other Position/Activity Restrictions: wbat   Pertinent Vitals/Pain 4-5/10    Mobility  Bed Mobility Supine to Sit: 4: Min assist;With rails Details for Bed Mobility Assistance:  (cues for thps) Transfers Transfers: Sit to Stand;Stand to Sit Sit to Stand: 4: Min assist;3: Mod assist;With armrests;From chair/3-in-1 Stand to Sit: 4: Min assist;To chair/3-in-1;With armrests Details for Transfer Assistance: cues for LE management and use of UES to self assist Ambulation/Gait Ambulation/Gait Assistance: 4: Min assist;3: Mod assist Ambulation Distance (Feet): 75 Feet Assistive device: Rolling walker Ambulation/Gait Assistance Details: cues for sequence, posture, position from RW, stride length Gait Pattern: Step-to pattern    Exercises     PT Diagnosis:    PT Problem List:   PT Treatment Interventions:     PT Goals Acute Rehab PT Goals PT Goal Formulation: With patient Time For Goal Achievement: 11/08/11 Potential to Achieve Goals: Good Pt will go Supine/Side to Sit: with supervision PT Goal: Supine/Side to Sit - Progress: Progressing toward goal Pt will go Sit to Supine/Side: with supervision PT Goal:  Sit to Supine/Side - Progress: Progressing toward goal Pt will go Sit to Stand: with supervision PT Goal: Sit to Stand - Progress: Progressing toward goal Pt will go Stand to Sit: with supervision PT Goal: Stand to Sit - Progress: Progressing toward goal Pt will Ambulate: 51 - 150 feet;with supervision;with rolling walker PT Goal: Ambulate - Progress: Progressing toward goal  Visit Information  Last PT Received On: 11/04/11 Assistance Needed: +1    Subjective Data  Subjective: I want to go to the rehab facility by car Patient Stated Goal: Resume previous lifestyle with decreased pain   Cognition  Overall Cognitive Status: Appears within functional limits for tasks assessed/performed Area of Impairment: Attention Arousal/Alertness: Awake/alert Orientation Level: Appears intact for tasks assessed Behavior During Session: [facility] for tasks performed Current Attention Level: Selective Cognition - Other Comments: distracts himself/talks a lot    Balance     End of Session PT - End of Session Equipment Utilized During Treatment: Gait belt Activity Tolerance: Patient tolerated treatment well;Patient limited by fatigue Patient left: in chair;with call bell/phone within reach Nurse Communication: Mobility status   GP     Gisell Buehrle 11/04/2011, 12:49 PM

## 2011-11-04 NOTE — Discharge Summary (Addendum)
Physician Discharge Summary   Patient ID: [name] MRN: [id number] DOB/AGE: Mar 12, 1932 78 y.o.  Admit date: 11/02/2011 Discharge date: Tentative Date of Discharge - 11/05/2011  Primary Diagnosis: Osteoarthritis Left hip  Admission Diagnoses:  Past Medical History  Diagnosis Date  . Diabetes mellitus without complication     takes lisinopril for kidneys  . Arthritis   . S/P knee replacement left 2011, right 2012   Discharge Diagnoses:   Principal Problem:  *OA (osteoarthritis) of hip Active Problems:  Postop Acute blood loss anemia  Postop Hyponatremia  Estimated Body mass index is 27.75 kg/(m^2) as calculated from the following:   Height as of this encounter: 5\' 11" (1.803 m).   Weight as of this encounter: 199 lb(90.266 kg).  Classification of overweight in adults according to BMI (WHO, 1998)   Procedure: Procedure(s) (LRB): TOTAL HIP ARTHROPLASTY (Left)   Consults: None  HPI: [name] is a 78 y.o. male with end stage arthritis of his left hip with progressively worsening pain and dysfunction. Pain occurs with activity and rest including pain at night. He has tried analgesics, protected weight bearing and rest without benefit. Pain is too severe to attempt physical therapy. Radiographs demonstrate bone on bone arthritis with subchondral cyst formation. He presents now for left THA.  Laboratory Data: Admission on 11/02/2011  Component Date Value Range Status  . ABO/RH(D) 11/02/2011 A NEG   Final  . Antibody Screen 11/02/2011 NEG   Final  . Sample Expiration 11/02/2011 11/05/2011   Final  . Glucose-Capillary 11/02/2011 148* 70 - 99 mg/dL Final  . Glucose-Capillary 11/02/2011 157* 70 - 99 mg/dL Final  . Comment 1 14/78/2956 Documented in Chart   Final  . Comment 2 11/02/2011 Notify RN   Final  . WBC 11/03/2011 5.3  4.0 - 10.5 K/uL Final  . RBC 11/03/2011 2.68* 4.22 - 5.81 MIL/uL Final  . Hemoglobin 11/03/2011 8.7* 13.0 - 17.0 g/dL Final  . HCT  21/30/8657 25.9* 39.0 - 52.0 % Final  . MCV 11/03/2011 96.6  78.0 - 100.0 fL Final  . MCH 11/03/2011 32.5  26.0 - 34.0 pg Final  . MCHC 11/03/2011 33.6  30.0 - 36.0 g/dL Final  . RDW 84/69/6295 12.6  11.5 - 15.5 % Final  . Platelets 11/03/2011 175  150 - 400 K/uL Final  . Sodium 11/03/2011 132* 135 - 145 mEq/L Final  . Potassium 11/03/2011 4.8  3.5 - 5.1 mEq/L Final  . Chloride 11/03/2011 100  96 - 112 mEq/L Final  . CO2 11/03/2011 27  19 - 32 mEq/L Final  . Glucose, Bld 11/03/2011 251* 70 - 99 mg/dL Final  . BUN 28/41/3244 22  6 - 23 mg/dL Final  . Creatinine, Ser 11/03/2011 1.16  0.50 - 1.35 mg/dL Final  . Calcium 01/12/7251 8.4  8.4 - 10.5 mg/dL Final  . GFR calc non Af Amer 11/03/2011 59* >90 mL/min Final  . GFR calc Af Amer 11/03/2011 68* >90 mL/min Final   Comment:                                 The eGFR has been calculated                          using the CKD EPI equation.  This calculation has not been                          validated in all clinical                          situations.                          eGFR's persistently                          <90 mL/min signify                          possible Chronic Kidney Disease.  . Glucose-Capillary 11/02/2011 242* 70 - 99 mg/dL Final  . Glucose-Capillary 11/02/2011 207* 70 - 99 mg/dL Final  . WBC 91/47/8295 6.7  4.0 - 10.5 K/uL Final  . RBC 11/04/2011 2.41* 4.22 - 5.81 MIL/uL Final  . Hemoglobin 11/04/2011 8.0* 13.0 - 17.0 g/dL Final  . HCT 62/13/0865 23.3* 39.0 - 52.0 % Final  . MCV 11/04/2011 96.7  78.0 - 100.0 fL Final  . MCH 11/04/2011 33.2  26.0 - 34.0 pg Final  . MCHC 11/04/2011 34.3  30.0 - 36.0 g/dL Final  . RDW 78/46/9629 12.4  11.5 - 15.5 % Final  . Platelets 11/04/2011 166  150 - 400 K/uL Final  . Sodium 11/04/2011 130* 135 - 145 mEq/L Final  . Potassium 11/04/2011 4.6  3.5 - 5.1 mEq/L Final  . Chloride 11/04/2011 99  96 - 112 mEq/L Final  . CO2 11/04/2011 26  19 - 32 mEq/L  Final  . Glucose, Bld 11/04/2011 139* 70 - 99 mg/dL Final  . BUN 52/84/1324 22  6 - 23 mg/dL Final  . Creatinine, Ser 11/04/2011 1.22  0.50 - 1.35 mg/dL Final  . Calcium 40/10/2723 8.7  8.4 - 10.5 mg/dL Final  . GFR calc non Af Amer 11/04/2011 55* >90 mL/min Final  . GFR calc Af Amer 11/04/2011 64* >90 mL/min Final   Comment:                                 The eGFR has been calculated                          using the CKD EPI equation.                          This calculation has not been                          validated in all clinical                          situations.                          eGFR's persistently                          <90 mL/min signify  possible Chronic Kidney Disease.  . Glucose-Capillary 11/03/2011 197* 70 - 99 mg/dL Final  . Glucose-Capillary 11/03/2011 157* 70 - 99 mg/dL Final  . Comment 1 16/10/9602 Notify RN   Final  . Glucose-Capillary 11/03/2011 176* 70 - 99 mg/dL Final  . Comment 1 54/09/8117 Notify RN   Final  . Glucose-Capillary 11/03/2011 62* 70 - 99 mg/dL Final  . Comment 1 14/78/2956 Documented in Chart   Final  . Comment 2 11/03/2011 Notify RN   Final  . Glucose-Capillary 11/03/2011 66* 70 - 99 mg/dL Final  . Comment 1 21/30/8657 Documented in Chart   Final  . Comment 2 11/03/2011 Notify RN   Final  . Glucose-Capillary 11/03/2011 69* 70 - 99 mg/dL Final  . Comment 1 84/69/6295 Documented in Chart   Final  . Comment 2 11/03/2011 Notify RN   Final  . Glucose-Capillary 11/03/2011 82  70 - 99 mg/dL Final  . Comment 1 28/41/3244 Notify RN   Final  . Comment 2 11/03/2011 Documented in Chart   Final  . Glucose-Capillary 11/04/2011 144* 70 - 99 mg/dL Final  . Comment 1 01/12/7251 Notify RN   Final  . Comment 2 11/04/2011 Documented in Chart   Final  Hospital Outpatient Visit on 10/26/2011  Component Date Value Range Status  . aPTT 10/26/2011 29  24 - 37 seconds Final  . WBC 10/26/2011 7.8  4.0 - 10.5 K/uL Final  .  RBC 10/26/2011 3.96* 4.22 - 5.81 MIL/uL Final  . Hemoglobin 10/26/2011 12.8* 13.0 - 17.0 g/dL Final  . HCT 66/44/0347 38.7* 39.0 - 52.0 % Final  . MCV 10/26/2011 97.7  78.0 - 100.0 fL Final  . MCH 10/26/2011 32.3  26.0 - 34.0 pg Final  . MCHC 10/26/2011 33.1  30.0 - 36.0 g/dL Final  . RDW 42/59/5638 12.5  11.5 - 15.5 % Final  . Platelets 10/26/2011 257  150 - 400 K/uL Final  . Sodium 10/26/2011 136  135 - 145 mEq/L Final  . Potassium 10/26/2011 5.1  3.5 - 5.1 mEq/L Final  . Chloride 10/26/2011 101  96 - 112 mEq/L Final  . CO2 10/26/2011 28  19 - 32 mEq/L Final  . Glucose, Bld 10/26/2011 58* 70 - 99 mg/dL Final  . BUN 75/64/3329 20  6 - 23 mg/dL Final  . Creatinine, Ser 10/26/2011 1.19  0.50 - 1.35 mg/dL Final  . Calcium 51/88/4166 9.9  8.4 - 10.5 mg/dL Final  . Total Protein 10/26/2011 8.0  6.0 - 8.3 g/dL Final  . Albumin 07/10/1599 3.9  3.5 - 5.2 g/dL Final  . AST 09/32/3557 20  0 - 37 U/L Final  . ALT 10/26/2011 12  0 - 53 U/L Final  . Alkaline Phosphatase 10/26/2011 106  39 - 117 U/L Final  . Total Bilirubin 10/26/2011 0.3  0.3 - 1.2 mg/dL Final  . GFR calc non Af Amer 10/26/2011 57* >90 mL/min Final  . GFR calc Af Amer 10/26/2011 66* >90 mL/min Final   Comment:                                 The eGFR has been calculated                          using the CKD EPI equation.  This calculation has not been                          validated in all clinical                          situations.                          eGFR's persistently                          <90 mL/min signify                          possible Chronic Kidney Disease.  Marland Kitchen Prothrombin Time 10/26/2011 12.6  11.6 - 15.2 seconds Final  . INR 10/26/2011 0.95  0.00 - 1.49 Final  . Color, Urine 10/26/2011 YELLOW  YELLOW Final  . APPearance 10/26/2011 CLEAR  CLEAR Final  . Specific Gravity, Urine 10/26/2011 1.021  1.005 - 1.030 Final  . pH 10/26/2011 6.0  5.0 - 8.0 Final  . Glucose, UA  10/26/2011 NEGATIVE  NEGATIVE mg/dL Final  . Hgb urine dipstick 10/26/2011 NEGATIVE  NEGATIVE Final  . Bilirubin Urine 10/26/2011 NEGATIVE  NEGATIVE Final  . Ketones, ur 10/26/2011 NEGATIVE  NEGATIVE mg/dL Final  . Protein, ur 40/98/1191 NEGATIVE  NEGATIVE mg/dL Final  . Urobilinogen, UA 10/26/2011 0.2  0.0 - 1.0 mg/dL Final  . Nitrite 47/82/9562 NEGATIVE  NEGATIVE Final  . Leukocytes, UA 10/26/2011 NEGATIVE  NEGATIVE Final   MICROSCOPIC NOT DONE ON URINES WITH NEGATIVE PROTEIN, BLOOD, LEUKOCYTES, NITRITE, OR GLUCOSE <1000 mg/dL.  Marland Kitchen MRSA, PCR 10/26/2011 NEGATIVE  NEGATIVE Final  . Staphylococcus aureus 10/26/2011 NEGATIVE  NEGATIVE Final   Comment:                                 The Xpert SA Assay (FDA                          approved for NASAL specimens                          in patients over 74 years of age),                          is one component of                          a comprehensive surveillance                          program.  Test performance has                          been validated by [employer] for patients greater                          than or equal to 23 year old.  It is not intended                          to diagnose infection nor to                          guide or monitor treatment.     X-Rays:Dg Chest 2 View  10/26/2011  *RADIOLOGY REPORT*  Clinical Data: Preop for left hip replacement  CHEST - 2 VIEW  Comparison: 03/21/2011  Findings: The cardiac silhouette is normal in size and configuration.  The aorta is mildly uncoiled.  No mediastinal or hilar masses or adenopathy.  The lungs are clear.  The bony thorax is intact.  No significant change from the prior study.  IMPRESSION: No active disease of the chest.   Original Report Authenticated By: Domenic Moras, M.D.    Dg Hip Complete Left  10/26/2011  *RADIOLOGY REPORT*  Clinical Data: Preop for left hip replacement.  The patient with hip pain and  limited motion.  LEFT HIP - COMPLETE 2+ VIEW  Comparison: 02/02/2011 at  Findings: There are advanced arthropathic changes of both hips with essentially complete  loss of the normal hip joint space along the medial and superior aspects, subchondral sclerosis and prominent subchondral cystic change and some remodeling and flattening of the femoral heads, which is most prominent on the right.  The findings have advanced on the right since the prior study.  There is no fracture.  The bones are demineralized.  The SI joints are normally spaced and aligned as is the symphysis pubis.  There are degenerative changes of the visible lower lumbar spine.  The soft tissues are unremarkable.  IMPRESSION: Advanced arthropathic changes of both hips, more severe on the right than the left, but both severe.  Changes have increased from the prior study on the right.  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   Original Report Authenticated By: Domenic Moras, M.D.    Dg Pelvis Portable  11/02/2011  *RADIOLOGY REPORT*  Clinical Data: Postop left hip surgery  PORTABLE PELVIS  Comparison: None.  Findings: Status post left total hip arthroplasty and near anatomic alignment and position on this single frontal view.  Associated subcutaneous gas and surgical drain.  No fracture or dislocation is seen.  Severe degenerate changes of the right hip with subchondral collapse of the humeral head.  IMPRESSION: Left total hip arthroplasty in satisfactory position.  Severe degenerative changes of the right hip, as described above.   Original Report Authenticated By: Charline Bills, M.D.    Dg Hip Portable 1 View Left  11/02/2011  *RADIOLOGY REPORT*  Clinical Data: Postop left hip arthroplasty  PORTABLE LEFT HIP - 1 VIEW  Comparison: Left hip radiographs dated 10/26/2011  Findings: Left total hip arthroplasty in satisfactory position.  Associated subcutaneous gas  and surgical drain.  No fracture or dislocation is seen.  IMPRESSION: Left total hip arthroplasty in satisfactory position.   Original Report Authenticated By: Charline Bills, M.D.     EKG: Orders placed during the hospital encounter of 11/02/11  . EKG     Hospital Course:  Patient was admitted to [facility] and taken to the OR and underwent the above state procedure without complications.  Patient tolerated the procedure well and was later transferred to the recovery room and then to the orthopaedic floor for  postoperative care.  They were given PO and IV analgesics for pain control following their surgery.  They were given 24 hours of postoperative antibiotics of  Anti-infectives     Start     Dose/Rate Route Frequency Ordered Stop   11/02/11 1600   ceFAZolin (ANCEF) IVPB 1 g/50 mL premix        1 g 100 mL/hr over 30 Minutes Intravenous Every 6 hours 11/02/11 1419 11/02/11 2256   11/02/11 0651   ceFAZolin (ANCEF) IVPB 2 g/50 mL premix        2 g 100 mL/hr over 30 Minutes Intravenous 60 min pre-op 11/02/11 0652 11/02/11 0948         and started on DVT prophylaxis in the form of Xarelto, ASA 81 mg on hold for now.  PT and OT were ordered for total hip protocol.  The patient was allowed to be WBAT with therapy. Discharge planning was consulted to help with postop disposition and equipment needs.  Patient had a decent night on the evening of surgery and started to get up OOB with therapy on day one.  He walked over 100 feet.  Hemovac drain was pulled without difficulty.  The knee immobilizer was removed and discontinued.  Continued to work with therapy into day two.  Dressing was changed on day two and the incision was healing well.  Patient was seen in rounds on day two. Plan was to go to the SNF the following day, Saturday 11/05/2011, as long as the bed was available and the patient was doing well.  Making arrangements to transfer tomorrow.  Patient will be seen and evaluated by  the weekend coverage staff on Saturday.  If he continues to progress and he is stable, he will transfer over at that time.   Discharge Medications: Prior to Admission medications   Medication Sig Start Date End Date Taking? Authorizing Provider  Insulin Isophane & Regular (HUMULIN 70/30 [place]) Inject 15 Units into the skin 2 (two) times daily.    Yes Historical Provider, MD  acetaminophen (TYLENOL) 325 MG tablet Take 2 tablets (650 mg total) by mouth every 6 (six) hours as needed (or Fever >/= 101). 11/04/11   Alexzandrew Julien Girt, PA  bisacodyl (DULCOLAX) 10 MG suppository Place 1 suppository (10 mg total) rectally daily as needed. 11/04/11   Alexzandrew Perkins, PA  docusate sodium 100 MG CAPS Take 100 mg by mouth 2 (two) times daily. 11/04/11   Alexzandrew Perkins, PA  lisinopril (PRINIVIL,ZESTRIL) 2.5 MG tablet Take 0.5 tablets (1.25 mg total) by mouth every morning. Pt states lisinopril is for helping kidneys due to diabetes HOLD IF SYSTOLIC LESS THAN 130 11/04/11   Alexzandrew Perkins, PA  methocarbamol (ROBAXIN) 500 MG tablet Take 1 tablet (500 mg total) by mouth every 6 (six) hours as needed. 11/04/11   Alexzandrew Perkins, PA  ondansetron (ZOFRAN) 4 MG tablet Take 1 tablet (4 mg total) by mouth every 6 (six) hours as needed for nausea. 11/04/11   Alexzandrew Perkins, PA  oxyCODONE (OXY IR/ROXICODONE) 5 MG immediate release tablet Take 1-2 tablets (5-10 mg total) by mouth every 3 (three) hours as needed. 11/04/11   Alexzandrew Perkins, PA  polyethylene glycol (MIRALAX / GLYCOLAX) packet Take 17 g by mouth daily as needed. 11/04/11   Alexzandrew Julien Girt, PA  rivaroxaban (XARELTO) 10 MG TABS tablet Take 1 tablet (10 mg total) by mouth daily with breakfast. Take Xarelto for two and a half more weeks, then discontinue Xarelto. Once the patient has completed  the Xarelto, they may resume the 81 mg Aspirin. 11/04/11   Alexzandrew Julien Girt, PA  traMADol (ULTRAM) 50 MG tablet Take 1-2 tablets (50-100 mg  total) by mouth every 6 (six) hours as needed for pain (mild pain). 11/04/11   Alexzandrew Julien Girt, PA    Diet: Diabetic diet Activity:WBAT No bending hip over 90 degrees- A "L" Angle Do not cross legs Do not let foot roll inward When turning these patients a pillow should be placed between the patient's legs to prevent crossing. Patients should have the affected knee fully extended when trying to sit or stand from all surfaces to prevent excessive hip flexion. When ambulating and turning toward the affected side the affected leg should have the toes turned out prior to moving the walker and the rest of patient's body as to prevent internal rotation/ turning in of the leg. Abduction pillows are the most effective way to prevent a patient from not crossing legs or turning toes in at rest. If an abduction pillow is not ordered placing a regular pillow length wise between the patient's legs is also an effective reminder. It is imperative that these precautions be maintained so that the surgical hip does not dislocate. Follow-up:in 2 weeks Disposition - Skilled nursing facility [facility] Discharged Condition: Pending, Plan is to go to SNF tomorrow if doing well.    Discharge Orders    Future Appointments: Provider: Department: Dept Phone: Center:   03/19/2012 9:30 AM Sherrie George, MD [facility] [phone] None     Future Orders Please Complete By Expires   Diet Carb Modified      Call MD / Call 911      Comments:   If you experience chest pain or shortness of breath, CALL 911 and be transported to the hospital emergency room.  If you develope a fever above 101 F, pus (white drainage) or increased drainage or redness at the wound, or calf pain, call your surgeon's office.   Discharge instructions      Comments:   Pick up stool softner and laxative for home. Do not submerge incision under water. May shower. Continue to use ice for pain and swelling from surgery. Hip  precautions.  Total Hip Protocol.  Take Xarelto for two and a half more weeks, then discontinue Xarelto. Once the patient has completed the Xarelto, they may resume the 81 mg Aspirin.  When discharged from the skilled rehab facility, please have the facility set up the patient's Home Health Physical Therapy prior to being released.  Also provide the patient with their medications at time of release from the facility to include their pain medication, the muscle relaxants, and their blood thinner medication.  If the patient is still at the rehab facility at time of follow up appointment, please also assist the patient in arranging follow up appointment in our office and any transportation needs.   Constipation Prevention      Comments:   Drink plenty of fluids.  Prune juice may be helpful.  You may use a stool softener, such as Colace (over the counter) 100 mg twice a day.  Use MiraLax (over the counter) for constipation as needed.   Increase activity slowly as tolerated      Patient may shower      Comments:   You may shower without a dressing once there is no drainage.  Do not wash over the wound.  If drainage remains, do not shower until drainage stops.   Weight  bearing as tolerated      Driving restrictions      Comments:   No driving until released by the physician.   Lifting restrictions      Comments:   No lifting until released by the physician.   Follow the hip precautions as taught in Physical Therapy      Change dressing      Comments:   You may change your dressing dressing daily with sterile 4 x 4 inch gauze dressing and paper tape.  Do not submerge the incision under water.   TED hose      Comments:   Use stockings (TED hose) for 3 weeks on both leg(s).  You may remove them at night for sleeping.   Do not sit on low chairs, stoools or toilet seats, as it may be difficult to get up from low surfaces          Medication List     As of 11/04/2011  9:10 AM    STOP taking  these medications         aspirin EC 81 MG tablet      ibuprofen 200 MG tablet   Commonly known as: ADVIL,MOTRIN      TAKE these medications         acetaminophen 325 MG tablet   Commonly known as: TYLENOL   Take 2 tablets (650 mg total) by mouth every 6 (six) hours as needed (or Fever >/= 101).      bisacodyl 10 MG suppository   Commonly known as: DULCOLAX   Place 1 suppository (10 mg total) rectally daily as needed.      DSS 100 MG Caps   Take 100 mg by mouth 2 (two) times daily.      HUMULIN 70/30 [place]   Inject 15 Units into the skin 2 (two) times daily.      lisinopril 2.5 MG tablet   Commonly known as: PRINIVIL,ZESTRIL   Take 0.5 tablets (1.25 mg total) by mouth every morning. Pt states lisinopril is for helping kidneys due to diabetes  HOLD IF SYSTOLIC LESS THAN 130      methocarbamol 500 MG tablet   Commonly known as: ROBAXIN   Take 1 tablet (500 mg total) by mouth every 6 (six) hours as needed.      ondansetron 4 MG tablet   Commonly known as: ZOFRAN   Take 1 tablet (4 mg total) by mouth every 6 (six) hours as needed for nausea.      oxyCODONE 5 MG immediate release tablet   Commonly known as: Oxy IR/ROXICODONE   Take 1-2 tablets (5-10 mg total) by mouth every 3 (three) hours as needed.      polyethylene glycol packet   Commonly known as: MIRALAX / GLYCOLAX   Take 17 g by mouth daily as needed.      rivaroxaban 10 MG Tabs tablet   Commonly known as: XARELTO   Take 1 tablet (10 mg total) by mouth daily with breakfast. Take Xarelto for two and a half more weeks, then discontinue Xarelto.  Once the patient has completed the Xarelto, they may resume the 81 mg Aspirin.      traMADol 50 MG tablet   Commonly known as: ULTRAM   Take 1-2 tablets (50-100 mg total) by mouth every 6 (six) hours as needed for pain (mild pain).           Follow-up Information    Follow up with Loanne Drilling, MD.  Schedule an appointment as soon as possible for a visit in 2 weeks.    Contact information:   [address] [place] [address] [phone]          Signed: Patrica Duel 11/04/2011, 9:10 AM  Addendum- Patient seen and examined on 10/26. He  Is stable and doing well. He has no symptoms from his anemia and is stable for discharge.

## 2011-11-04 NOTE — Progress Notes (Signed)
Hypoglycemic Event  CBG: 69    Treatment: 15 GM carbohydrate snack  Symptoms: None  Follow-up CBG: Time: 2338 CBG Result: 82  Possible Reasons for Event: Unknown  Comments/MD notified:    [name] Annabess  Remember to initiate Hypoglycemia Order Set & complete

## 2011-11-04 NOTE — Progress Notes (Signed)
CSW assisting with d/c planning. Pt has accepted ST SNF bed at [facility]. Bed is available Sat. Week end CSW will assist with d/c planning to SNF.    Cori Razor LCSW [phone]

## 2011-11-04 NOTE — Evaluation (Signed)
Occupational Therapy Evaluation Patient Details Name: JAVYON FONTAN MRN: [id number] DOB: 22-Jun-1932 Today's Date: 11/04/2011 Time: 4098-1191 OT Time Calculation (min): 26 min  OT Assessment / Plan / Recommendation Clinical Impression  This 78 year old man was admitted  for L posterior approach tha.  He is appropriate for skilled OT to increase independence with adls with min A level goals in acute    OT Assessment  Patient needs continued OT Services    Follow Up Recommendations  Skilled nursing facility    Barriers to Discharge      Equipment Recommendations  3 in 1 bedside comode;None recommended by PT    Recommendations for Other Services    Frequency  Min 2X/week    Precautions / Restrictions Precautions Precautions: Posterior Hip Precaution Comments: Pt read sign of hip precautions when asked what they were Restrictions Other Position/Activity Restrictions: wbat   Pertinent Vitals/Pain No c/o pain    ADL  Upper Body Bathing: Performed;Supervision/safety (min cues to continue: talks a lot) Where Assessed - Upper Body Bathing: Supported sitting Lower Body Bathing: Performed;Moderate assistance (with AE) Where Assessed - Lower Body Bathing: Supported sit to stand Upper Body Dressing: Performed;Minimal assistance (iv) Where Assessed - Upper Body Dressing: Supported sitting Lower Body Dressing: Performed;Maximal assistance (with AE:  pt 40%) Where Assessed - Lower Body Dressing: Supported sit to stand Toilet Transfer: Simulated;Minimal assistance (mod cues for no IR (THPS)) Toilet Transfer Method: Stand pivot Acupuncturist:  (bed to chair) Toileting - Clothing Manipulation and Hygiene: Min guard;Simulated Where Assessed - Toileting Clothing Manipulation and Hygiene: Standing ADL Comments: pt needs reinforcement with thps in function: especially 90 hip flexion and internal rotation.  Very pleasant but talks a lot and distracts himself    OT Diagnosis:  Generalized weakness  OT Problem List: Decreased activity tolerance;Decreased knowledge of use of DME or AE;Decreased knowledge of precautions;Decreased strength OT Treatment Interventions: Self-care/ADL training;DME and/or AE instruction;Therapeutic activities;Patient/family education   OT Goals Acute Rehab OT Goals OT Goal Formulation: With patient Time For Goal Achievement: 11/11/11 Potential to Achieve Goals: Good ADL Goals Pt Will Perform Grooming: with supervision;Standing at sink ADL Goal: Grooming - Progress: Goal set today Pt Will Perform Lower Body Bathing: with min assist;Sit to stand from chair;with adaptive equipment;with cueing (comment type and amount) (min cues for thps) ADL Goal: Lower Body Bathing - Progress: Goal set today Pt Will Perform Lower Body Dressing: with min assist;Sit to stand from chair;with adaptive equipment;with cueing (comment type and amount) (min cues for thps) ADL Goal: Lower Body Dressing - Progress: Goal set today Pt Will Transfer to Toilet: with min assist;Ambulation;Comfort height toilet;Maintaining hip precautions;with cueing (comment type and amount) (min guard, min cues) ADL Goal: Toilet Transfer - Progress: Goal set today Pt Will Perform Toileting - Hygiene: with min assist;Standing at 3-in-1/toilet;with cueing (comment type and amount) (min guard, min cues) ADL Goal: Toileting - Hygiene - Progress: Goal set today Miscellaneous OT Goals Miscellaneous OT Goal #1: Pt will verbalize 3/3 thps without visual aid OT Goal: Miscellaneous Goal #1 - Progress: Goal set today  Visit Information  Last OT Received On: 11/04/11 Assistance Needed: +1    Subjective Data  Subjective: I have a lady that helps me.  I pick her up and she'll do anything Patient Stated Goal: rehab then home   Prior Functioning     Home Living Lives With: Alone Prior Function Driving: Yes Comments: performed badls indepdendently Communication Communication: No  difficulties Dominant Hand: Right  Vision/Perception     Cognition  Overall Cognitive Status: Impaired Area of Impairment: Attention Arousal/Alertness: Awake/alert Orientation Level: Appears intact for tasks assessed Behavior During Session: [facility] for tasks performed Current Attention Level: Selective Cognition - Other Comments: distracts himself/talks a lot    Extremity/Trunk Assessment Right Upper Extremity Assessment RUE ROM/Strength/Tone: WFL for tasks assessed Left Upper Extremity Assessment LUE ROM/Strength/Tone: WFL for tasks assessed     Mobility Bed Mobility Supine to Sit: 4: Min assist;With rails Details for Bed Mobility Assistance:  (cues for thps) Transfers Sit to Stand: 4: Min assist;From bed;With upper extremity assist;From elevated surface Details for Transfer Assistance: cues for LE management and use of UES to self assist     Shoulder Instructions     Exercise     Balance     End of Session OT - End of Session Equipment Utilized During Treatment:  (reacher, sock aid, rw) Activity Tolerance: Patient tolerated treatment well Patient left: in chair;with call bell/phone within reach  GO     Zoe Goonan 11/04/2011, 11:24 AM Marica Otter, OTR/L [phone] 11/04/2011

## 2011-11-04 NOTE — Progress Notes (Signed)
Hypoglycemic Event  CBG: 62  Treatment: 15 GM carbohydrate snack  Symptoms: None  Follow-up CBG: Time: 2244 CBG Result: 66  Possible Reasons for Event: Unknown  Comments/MD notified     [name] Annabess  Remember to initiate Hypoglycemia Order Set & complete

## 2011-11-05 ENCOUNTER — Inpatient Hospital Stay
Admission: RE | Admit: 2011-11-05 | Discharge: 2011-11-23 | Disposition: A | Discharge status: Home / Self Care | Payer: No Typology Code available for payment source | Source: Ambulatory Visit | Attending: Internal Medicine | Admitting: Internal Medicine

## 2011-11-05 DIAGNOSIS — IMO0001 Reserved for inherently not codable concepts without codable children: Secondary | ICD-10-CM | POA: Diagnosis not present

## 2011-11-05 DIAGNOSIS — M167 Other unilateral secondary osteoarthritis of hip: Secondary | ICD-10-CM | POA: Diagnosis not present

## 2011-11-05 DIAGNOSIS — Z5189 Encounter for other specified aftercare: Secondary | ICD-10-CM | POA: Diagnosis not present

## 2011-11-05 DIAGNOSIS — C4491 Basal cell carcinoma of skin, unspecified: Secondary | ICD-10-CM | POA: Diagnosis not present

## 2011-11-05 DIAGNOSIS — Z96649 Presence of unspecified artificial hip joint: Secondary | ICD-10-CM | POA: Diagnosis not present

## 2011-11-05 DIAGNOSIS — R262 Difficulty in walking, not elsewhere classified: Secondary | ICD-10-CM | POA: Diagnosis not present

## 2011-11-05 DIAGNOSIS — M6281 Muscle weakness (generalized): Secondary | ICD-10-CM | POA: Diagnosis not present

## 2011-11-05 DIAGNOSIS — M79609 Pain in unspecified limb: Secondary | ICD-10-CM | POA: Diagnosis not present

## 2011-11-05 DIAGNOSIS — M7989 Other specified soft tissue disorders: Secondary | ICD-10-CM | POA: Diagnosis not present

## 2011-11-05 DIAGNOSIS — R609 Edema, unspecified: Secondary | ICD-10-CM | POA: Diagnosis not present

## 2011-11-05 DIAGNOSIS — Z471 Aftercare following joint replacement surgery: Secondary | ICD-10-CM | POA: Diagnosis not present

## 2011-11-05 DIAGNOSIS — E1129 Type 2 diabetes mellitus with other diabetic kidney complication: Secondary | ICD-10-CM | POA: Diagnosis not present

## 2011-11-05 DIAGNOSIS — R279 Unspecified lack of coordination: Secondary | ICD-10-CM | POA: Diagnosis not present

## 2011-11-05 DIAGNOSIS — M1990 Unspecified osteoarthritis, unspecified site: Secondary | ICD-10-CM | POA: Diagnosis not present

## 2011-11-05 LAB — GLUCOSE, CAPILLARY: Glucose-Capillary: 102 mg/dL — ABNORMAL HIGH (ref 70–99)

## 2011-11-05 LAB — CBC
MCH: 32.6 pg (ref 26.0–34.0)
MCV: 97.5 fL (ref 78.0–100.0)
Platelets: 165 10*3/uL (ref 150–400)
RBC: 2.36 MIL/uL — ABNORMAL LOW (ref 4.22–5.81)
RDW: 12.8 % (ref 11.5–15.5)
WBC: 6.4 10*3/uL (ref 4.0–10.5)

## 2011-11-05 NOTE — Progress Notes (Signed)
Patient's son picked him up for discharge. Pt assessment has not changed. Son was given yellow evenlope to take to faclility with them.

## 2011-11-05 NOTE — Progress Notes (Signed)
   Subjective: 3 Days Post-Op Procedure(s) (LRB): TOTAL HIP ARTHROPLASTY (Left) Patient reports pain as mild.  Denies any dizziness or light headedness Plan is to go Skilled nursing facility after hospital stay.  Objective: Vital signs in last 24 hours: Temp:  [98.3 F (36.8 C)-99 F (37.2 C)] 98.9 F (37.2 C) (10/26 0627) Pulse Rate:  [65-85] 65  (10/26 0627) Resp:  [15-18] 16  (10/26 0627) BP: (110-131)/(36-62) 127/62 mmHg (10/26 0627) SpO2:  [95 %-97 %] 97 % (10/26 0627)  Intake/Output from previous day:  Intake/Output Summary (Last 24 hours) at 11/05/11 0755 Last data filed at 11/05/11 7829  Gross per 24 hour  Intake   2059 ml  Output   1650 ml  Net    409 ml    Intake/Output this shift:    Labs:  Basename 11/05/11 0501 11/04/11 0440 11/03/11 0403  HGB 7.7* 8.0* 8.7*    Basename 11/05/11 0501 11/04/11 0440  WBC 6.4 6.7  RBC 2.36* 2.41*  HCT 23.0* 23.3*  PLT 165 166    Basename 11/04/11 0440 11/03/11 0403  NA 130* 132*  K 4.6 4.8  CL 99 100  CO2 26 27  BUN 22 22  CREATININE 1.22 1.16  GLUCOSE 139* 251*  CALCIUM 8.7 8.4   No results found for this basename: LABPT:2,INR:2 in the last 72 hours  EXAM General - Patient is Alert, Appropriate and Oriented Extremity - Neurologically intact Neurovascular intact Incision: dressing C/D/I No cellulitis present Dressing/Incision - clean, dry, no drainage Motor Function - intact, moving foot and toes well on exam.   Past Medical History  Diagnosis Date  . Diabetes mellitus without complication     takes lisinopril for kidneys  . Arthritis   . S/P knee replacement left 2011, right 2012    Assessment/Plan: 3 Days Post-Op Procedure(s) (LRB): TOTAL HIP ARTHROPLASTY (Left) Principal Problem:  *OA (osteoarthritis) of hip Active Problems:  Postop Acute blood loss anemia  Postop Hyponatremia   Discharge to SNF Patient is asymptomatic with his anemia.  DVT Prophylaxis - Xarelto Weight Bearing As  Tolerated left Leg  Rheanna Sergent V 11/05/2011, 7:55 AM

## 2011-11-05 NOTE — Progress Notes (Signed)
Pt to be d/c today to [facility].   Pt and family agreeable. Confirmed plans with facility.  Plan transfer via EMS.   Leron Croak, LCSWA [employer] [phone]

## 2011-11-05 NOTE — Progress Notes (Signed)
Physical Therapy Treatment Patient Details Name: RYOTA TREECE MRN: [id number] DOB: 07-10-1932 Today's Date: 11/05/2011 Time: 4098-1191 PT Time Calculation (min): 26 min  PT Assessment / Plan / Recommendation Comments on Treatment Session  Pt declined his am session stating he was leaving today.  So later, assisteed pt OOB to wc then into a low Tayota Camery.  Pt required increased time and 75% VC's to avoid hio flex > 90'    Follow Up Recommendations   (skilled nursing)     Does the patient have the potential to tolerate intense rehabilitation     Barriers to Discharge        Equipment Recommendations       Recommendations for Other Services    Frequency 7X/week   Plan Discharge plan remains appropriate    Precautions / Restrictions Precautions Precaution Comments: pt recalls 3/3 THP but requires 75% VC's during transfers as pt is impulsive and honory. Restrictions Weight Bearing Restrictions: No Other Position/Activity Restrictions: WBAT   Pertinent Vitals/Pain C/o soreness    Mobility  Bed Mobility Bed Mobility: Supine to Sit Supine to Sit: 4: Min assist Details for Bed Mobility Assistance: cues for sequence and for adherence to THP  Transfers Transfers: Sit to Stand;Stand to Sit Sit to Stand: From toilet;From chair/3-in-1;4: Min assist Stand to Sit: To chair/3-in-1;4: Min assist (to car) Details for Transfer Assistance: 75% VC's on proper car transfer tech to avoid hip flex > 90'  Ambulation/Gait Ambulation Distance (Feet): 5 Feet Ambulation/Gait Assistance Details: Pt declined furthur amb stating he was being D/C today. Gait velocity: decreased     PT Goals progressing    Visit Information  Last PT Received On: 11/05/11 Assistance Needed: +1    Subjective Data      Cognition       Balance     End of Session PT - End of Session Equipment Utilized During Treatment: Gait belt Activity Tolerance: Patient tolerated treatment well Patient left:  Other (comment) (in car son transporting to SNF)   Felecia Shelling  PTA [facility]  Acute  Rehab Pager     [phone]

## 2011-11-07 ENCOUNTER — Ambulatory Visit (HOSPITAL_COMMUNITY)
Admission: RE | Admit: 2011-11-07 | Discharge: 2011-11-07 | Disposition: A | Discharge status: Home / Self Care | Payer: Medicare Other | Source: Ambulatory Visit | Attending: Internal Medicine | Admitting: Internal Medicine

## 2011-11-07 ENCOUNTER — Other Ambulatory Visit (HOSPITAL_BASED_OUTPATIENT_CLINIC_OR_DEPARTMENT_OTHER): Payer: Self-pay | Admitting: Internal Medicine

## 2011-11-07 DIAGNOSIS — R52 Pain, unspecified: Secondary | ICD-10-CM

## 2011-11-07 DIAGNOSIS — R609 Edema, unspecified: Secondary | ICD-10-CM

## 2011-11-07 DIAGNOSIS — M1990 Unspecified osteoarthritis, unspecified site: Secondary | ICD-10-CM | POA: Diagnosis not present

## 2011-11-07 DIAGNOSIS — M79609 Pain in unspecified limb: Secondary | ICD-10-CM | POA: Insufficient documentation

## 2011-11-07 DIAGNOSIS — C4491 Basal cell carcinoma of skin, unspecified: Secondary | ICD-10-CM | POA: Diagnosis not present

## 2011-11-07 DIAGNOSIS — M7989 Other specified soft tissue disorders: Secondary | ICD-10-CM | POA: Insufficient documentation

## 2011-11-07 DIAGNOSIS — E1165 Type 2 diabetes mellitus with hyperglycemia: Secondary | ICD-10-CM | POA: Diagnosis not present

## 2011-11-07 LAB — GLUCOSE, CAPILLARY
Glucose-Capillary: 208 mg/dL — ABNORMAL HIGH (ref 70–99)
Glucose-Capillary: 136 mg/dL — ABNORMAL HIGH (ref 70–99)

## 2011-11-09 LAB — GLUCOSE, CAPILLARY
Glucose-Capillary: 164 mg/dL — ABNORMAL HIGH (ref 70–99)
Glucose-Capillary: 225 mg/dL — ABNORMAL HIGH (ref 70–99)
Glucose-Capillary: 143 mg/dL — ABNORMAL HIGH (ref 70–99)
Glucose-Capillary: 64 mg/dL — ABNORMAL LOW (ref 70–99)
Glucose-Capillary: 238 mg/dL — ABNORMAL HIGH (ref 70–99)
Glucose-Capillary: 164 mg/dL — ABNORMAL HIGH (ref 70–99)

## 2011-11-11 LAB — GLUCOSE, CAPILLARY
Glucose-Capillary: 93 mg/dL (ref 70–99)
Glucose-Capillary: 88 mg/dL (ref 70–99)
Glucose-Capillary: 132 mg/dL — ABNORMAL HIGH (ref 70–99)

## 2011-11-12 LAB — GLUCOSE, CAPILLARY: Glucose-Capillary: 155 mg/dL — ABNORMAL HIGH (ref 70–99)

## 2011-11-13 LAB — GLUCOSE, CAPILLARY
Glucose-Capillary: 108 mg/dL — ABNORMAL HIGH (ref 70–99)
Glucose-Capillary: 127 mg/dL — ABNORMAL HIGH (ref 70–99)

## 2011-11-14 LAB — GLUCOSE, CAPILLARY
Glucose-Capillary: 187 mg/dL — ABNORMAL HIGH (ref 70–99)
Glucose-Capillary: 169 mg/dL — ABNORMAL HIGH (ref 70–99)
Glucose-Capillary: 141 mg/dL — ABNORMAL HIGH (ref 70–99)

## 2011-11-15 LAB — GLUCOSE, CAPILLARY
Glucose-Capillary: 131 mg/dL — ABNORMAL HIGH (ref 70–99)
Glucose-Capillary: 167 mg/dL — ABNORMAL HIGH (ref 70–99)

## 2011-11-17 LAB — GLUCOSE, CAPILLARY
Glucose-Capillary: 99 mg/dL (ref 70–99)
Glucose-Capillary: 195 mg/dL — ABNORMAL HIGH (ref 70–99)

## 2011-11-18 LAB — GLUCOSE, CAPILLARY: Glucose-Capillary: 96 mg/dL (ref 70–99)

## 2011-11-19 LAB — GLUCOSE, CAPILLARY: Glucose-Capillary: 73 mg/dL (ref 70–99)

## 2011-11-20 LAB — GLUCOSE, CAPILLARY
Glucose-Capillary: 112 mg/dL — ABNORMAL HIGH (ref 70–99)
Glucose-Capillary: 141 mg/dL — ABNORMAL HIGH (ref 70–99)

## 2011-11-21 LAB — GLUCOSE, CAPILLARY: Glucose-Capillary: 122 mg/dL — ABNORMAL HIGH (ref 70–99)

## 2011-11-22 LAB — GLUCOSE, CAPILLARY: Glucose-Capillary: 81 mg/dL (ref 70–99)

## 2011-11-23 DIAGNOSIS — M1990 Unspecified osteoarthritis, unspecified site: Secondary | ICD-10-CM | POA: Diagnosis not present

## 2011-11-25 DIAGNOSIS — Z471 Aftercare following joint replacement surgery: Secondary | ICD-10-CM | POA: Diagnosis not present

## 2011-11-25 DIAGNOSIS — E119 Type 2 diabetes mellitus without complications: Secondary | ICD-10-CM | POA: Diagnosis not present

## 2011-11-25 DIAGNOSIS — G8918 Other acute postprocedural pain: Secondary | ICD-10-CM | POA: Diagnosis not present

## 2011-11-25 DIAGNOSIS — Z96649 Presence of unspecified artificial hip joint: Secondary | ICD-10-CM | POA: Diagnosis not present

## 2011-11-25 DIAGNOSIS — M6281 Muscle weakness (generalized): Secondary | ICD-10-CM | POA: Diagnosis not present

## 2011-11-25 DIAGNOSIS — R269 Unspecified abnormalities of gait and mobility: Secondary | ICD-10-CM | POA: Diagnosis not present

## 2011-12-12 DIAGNOSIS — M25559 Pain in unspecified hip: Secondary | ICD-10-CM | POA: Diagnosis not present

## 2011-12-12 DIAGNOSIS — E119 Type 2 diabetes mellitus without complications: Secondary | ICD-10-CM | POA: Diagnosis not present

## 2011-12-14 DIAGNOSIS — L89899 Pressure ulcer of other site, unspecified stage: Secondary | ICD-10-CM | POA: Diagnosis not present

## 2011-12-21 DIAGNOSIS — E1159 Type 2 diabetes mellitus with other circulatory complications: Secondary | ICD-10-CM | POA: Diagnosis not present

## 2011-12-21 DIAGNOSIS — E119 Type 2 diabetes mellitus without complications: Secondary | ICD-10-CM | POA: Diagnosis not present

## 2012-01-02 DIAGNOSIS — L89899 Pressure ulcer of other site, unspecified stage: Secondary | ICD-10-CM | POA: Diagnosis not present

## 2012-01-13 ENCOUNTER — Other Ambulatory Visit: Payer: Self-pay | Admitting: Orthopedic Surgery

## 2012-01-13 MED ORDER — BUPIVACAINE LIPOSOME 1.3 % IJ SUSP: 20.0000 mL | Freq: Once | INTRAMUSCULAR | Status: DC

## 2012-01-13 MED ORDER — DEXAMETHASONE SODIUM PHOSPHATE 10 MG/ML IJ SOLN: 10.0000 mg | Freq: Once | INTRAMUSCULAR | Status: DC

## 2012-01-13 NOTE — Progress Notes (Signed)
Preoperative surgical orders have been place into the Epic hospital system for [name] on 01/13/2012, 1:55 PM  by Patrica Duel for surgery on 02/24/2012.  Preop Total Hip orders including Experel Injecion, IV Tylenol, and IV Decadron as long as there are no contraindications to the above medications. Avel Peace, PA-C

## 2012-01-24 DIAGNOSIS — S0100XA Unspecified open wound of scalp, initial encounter: Secondary | ICD-10-CM | POA: Diagnosis not present

## 2012-01-24 DIAGNOSIS — Z96649 Presence of unspecified artificial hip joint: Secondary | ICD-10-CM | POA: Diagnosis not present

## 2012-01-24 DIAGNOSIS — Z471 Aftercare following joint replacement surgery: Secondary | ICD-10-CM | POA: Diagnosis not present

## 2012-01-24 DIAGNOSIS — E119 Type 2 diabetes mellitus without complications: Secondary | ICD-10-CM | POA: Diagnosis not present

## 2012-01-25 DIAGNOSIS — E119 Type 2 diabetes mellitus without complications: Secondary | ICD-10-CM | POA: Diagnosis not present

## 2012-01-25 DIAGNOSIS — Z471 Aftercare following joint replacement surgery: Secondary | ICD-10-CM | POA: Diagnosis not present

## 2012-01-25 DIAGNOSIS — S0100XA Unspecified open wound of scalp, initial encounter: Secondary | ICD-10-CM | POA: Diagnosis not present

## 2012-01-25 DIAGNOSIS — Z96649 Presence of unspecified artificial hip joint: Secondary | ICD-10-CM | POA: Diagnosis not present

## 2012-02-01 DIAGNOSIS — Z471 Aftercare following joint replacement surgery: Secondary | ICD-10-CM | POA: Diagnosis not present

## 2012-02-01 DIAGNOSIS — S0100XA Unspecified open wound of scalp, initial encounter: Secondary | ICD-10-CM | POA: Diagnosis not present

## 2012-02-01 DIAGNOSIS — E119 Type 2 diabetes mellitus without complications: Secondary | ICD-10-CM | POA: Diagnosis not present

## 2012-02-01 DIAGNOSIS — Z96649 Presence of unspecified artificial hip joint: Secondary | ICD-10-CM | POA: Diagnosis not present

## 2012-02-02 DIAGNOSIS — S0100XA Unspecified open wound of scalp, initial encounter: Secondary | ICD-10-CM | POA: Diagnosis not present

## 2012-02-02 DIAGNOSIS — E119 Type 2 diabetes mellitus without complications: Secondary | ICD-10-CM | POA: Diagnosis not present

## 2012-02-02 DIAGNOSIS — Z96649 Presence of unspecified artificial hip joint: Secondary | ICD-10-CM | POA: Diagnosis not present

## 2012-02-02 DIAGNOSIS — Z471 Aftercare following joint replacement surgery: Secondary | ICD-10-CM | POA: Diagnosis not present

## 2012-02-07 DIAGNOSIS — S0100XA Unspecified open wound of scalp, initial encounter: Secondary | ICD-10-CM | POA: Diagnosis not present

## 2012-02-07 DIAGNOSIS — Z96649 Presence of unspecified artificial hip joint: Secondary | ICD-10-CM | POA: Diagnosis not present

## 2012-02-07 DIAGNOSIS — Z471 Aftercare following joint replacement surgery: Secondary | ICD-10-CM | POA: Diagnosis not present

## 2012-02-07 DIAGNOSIS — E119 Type 2 diabetes mellitus without complications: Secondary | ICD-10-CM | POA: Diagnosis not present

## 2012-02-15 ENCOUNTER — Encounter (HOSPITAL_COMMUNITY): Payer: Self-pay | Admitting: Pharmacy Technician

## 2012-02-15 ENCOUNTER — Ambulatory Visit (HOSPITAL_COMMUNITY)
Admission: RE | Admit: 2012-02-15 | Discharge: 2012-02-15 | Disposition: A | Discharge status: Home / Self Care | Payer: Medicare Other | Source: Ambulatory Visit | Attending: Orthopedic Surgery | Admitting: Orthopedic Surgery

## 2012-02-15 ENCOUNTER — Encounter (HOSPITAL_COMMUNITY)
Admission: RE | Admit: 2012-02-15 | Discharge: 2012-02-15 | Disposition: A | Discharge status: Home / Self Care | Payer: Medicare Other | Source: Ambulatory Visit | Attending: Orthopedic Surgery | Admitting: Orthopedic Surgery

## 2012-02-15 ENCOUNTER — Other Ambulatory Visit (HOSPITAL_COMMUNITY): Payer: Self-pay | Admitting: Orthopedic Surgery

## 2012-02-15 DIAGNOSIS — Z96649 Presence of unspecified artificial hip joint: Secondary | ICD-10-CM | POA: Diagnosis not present

## 2012-02-15 DIAGNOSIS — Z01818 Encounter for other preprocedural examination: Secondary | ICD-10-CM | POA: Diagnosis not present

## 2012-02-15 DIAGNOSIS — M169 Osteoarthritis of hip, unspecified: Secondary | ICD-10-CM | POA: Diagnosis not present

## 2012-02-15 DIAGNOSIS — Z01812 Encounter for preprocedural laboratory examination: Secondary | ICD-10-CM | POA: Diagnosis not present

## 2012-02-15 LAB — CBC
HCT: 39.2 % (ref 39.0–52.0)
Hemoglobin: 12.7 g/dL — ABNORMAL LOW (ref 13.0–17.0)
RBC: 4.11 MIL/uL — ABNORMAL LOW (ref 4.22–5.81)
WBC: 5.5 10*3/uL (ref 4.0–10.5)

## 2012-02-15 LAB — URINALYSIS, ROUTINE W REFLEX MICROSCOPIC
Bilirubin Urine: NEGATIVE
Nitrite: NEGATIVE
Specific Gravity, Urine: 1.025 (ref 1.005–1.030)
pH: 6.5 (ref 5.0–8.0)

## 2012-02-15 LAB — COMPREHENSIVE METABOLIC PANEL
ALT: 14 U/L (ref 0–53)
Alkaline Phosphatase: 96 U/L (ref 39–117)
BUN: 29 mg/dL — ABNORMAL HIGH (ref 6–23)
Chloride: 100 mEq/L (ref 96–112)
GFR calc Af Amer: 66 mL/min — ABNORMAL LOW (ref >90)
Glucose, Bld: 127 mg/dL — ABNORMAL HIGH (ref 70–99)
Potassium: 6.3 mEq/L (ref 3.5–5.1)
Total Bilirubin: 0.3 mg/dL (ref 0.3–1.2)

## 2012-02-15 LAB — TYPE AND SCREEN

## 2012-02-15 LAB — PROTIME-INR: Prothrombin Time: 12.9 seconds (ref 11.6–15.2)

## 2012-02-15 LAB — APTT: aPTT: 33 seconds (ref 24–37)

## 2012-02-15 NOTE — Pre-Procedure Instructions (Addendum)
02-15-12 EKG/CXR(10'13) -E;pic. R. Hip x-ray done. 02-15-12 1150 -Report from "[facility]" in lab of critical Lab value-Potassium 6.3. W. Zaevion Parke,RN 02-15-11 1155 -Potassium level 6.3 called to Dr. Deri Fuelling PA- Milas Hock with " [facility]"-instructed me he would call pt. Patient has left hospital for presurgical testing visit. WKennon Portela 02-20-12 0920- Call to Avel Peace, to check if any action for Potassium level 6.3, States to redraw BMP on arrival. Pt. Was called and informed of test to be drawn on arrival preop. W. Kennon Portela

## 2012-02-15 NOTE — Patient Instructions (Addendum)
[address] MIRON MARXEN  02/15/2012   Your procedure is scheduled on: 2-14  -2014  Report to [facility] at    0845    AM ..  Call this number if you have problems the morning of surgery: [phone]  Or Presurgical Testing [phone](Analiz Tvedt)      Do not eat food:After Midnight.  .  Take these medicines the morning of surgery with A SIP OF WATER: none, but Tylenol. Do not take any Insulin AM of surgery. On 02-23-12 PM use 1/2 dose(Novolin)-7 units for bedtime dose once.   Do not wear jewelry, make-up or nail polish.  Do not wear lotions, powders, or perfumes. You may wear deodorant.  Do not shave 12 hours prior to first CHG shower(legs and under arms).(face and neck okay.)  Do not bring valuables to the hospital.  Contacts, dentures or bridgework,body piercing,  may not be worn into surgery.  Leave suitcase in the car. After surgery it may be brought to your room.  For patients admitted to the hospital, checkout time is 11:00 AM the day of discharge.   Patients discharged the day of surgery will not be allowed to drive home. Must have responsible person with you x 24 hours once discharged.  Name and phone number of your driver: [phone] cell Ethelene Browns -son  Special Instructions: CHG Shower Use Special Wash: see special instructions.(avoid face and genitals)   Please read over the following fact sheets that you were given: MRSA Information, Blood Transfusion fact sheet, Incentive Spirometry Instruction.    Failure to follow these instructions may result in Cancellation of your surgery.   Patient signature_______________________________________________________

## 2012-02-18 DIAGNOSIS — S0100XA Unspecified open wound of scalp, initial encounter: Secondary | ICD-10-CM | POA: Diagnosis not present

## 2012-02-18 DIAGNOSIS — Z96649 Presence of unspecified artificial hip joint: Secondary | ICD-10-CM | POA: Diagnosis not present

## 2012-02-18 DIAGNOSIS — Z471 Aftercare following joint replacement surgery: Secondary | ICD-10-CM | POA: Diagnosis not present

## 2012-02-18 DIAGNOSIS — E119 Type 2 diabetes mellitus without complications: Secondary | ICD-10-CM | POA: Diagnosis not present

## 2012-02-20 ENCOUNTER — Other Ambulatory Visit (HOSPITAL_COMMUNITY): Payer: Self-pay | Admitting: Orthopedic Surgery

## 2012-02-21 ENCOUNTER — Other Ambulatory Visit: Payer: Self-pay | Admitting: Orthopedic Surgery

## 2012-02-21 DIAGNOSIS — E119 Type 2 diabetes mellitus without complications: Secondary | ICD-10-CM | POA: Diagnosis not present

## 2012-02-21 DIAGNOSIS — Z96649 Presence of unspecified artificial hip joint: Secondary | ICD-10-CM | POA: Diagnosis not present

## 2012-02-21 DIAGNOSIS — Z471 Aftercare following joint replacement surgery: Secondary | ICD-10-CM | POA: Diagnosis not present

## 2012-02-21 DIAGNOSIS — S0100XA Unspecified open wound of scalp, initial encounter: Secondary | ICD-10-CM | POA: Diagnosis not present

## 2012-02-21 NOTE — H&P (Signed)
[name]  DOB: [birth year] Divorced / Language: English / Race: White Male  Date of Admission:  02/24/2012  Chief Complaint:  Right Hip Pain  History of Present Illness The patient is a 79 year old male who comes in for a preoperative History and Physical. The patient is scheduled for a right total hip arthroplasty to be performed by Dr. Gus Rankin. Aluisio, MD at [facility] on 02/24/2012. He has a very hard time walking. He feels a lot of stiffness in his hip. The right hip makes noise like it did when he was bone on bone. He has no pain at all in the right knee. The patient has previously undergone bilateral knee replacement and also a left total hip replacement. He has been doing well following these three procedures and is now ready to proceed with the right total hip surgery. They have been treated conservatively in the past for the above stated problem and despite conservative measures, they continue to have progressive pain and severe functional limitations and dysfunction. They have failed non-operative management including home exercise, medications. It is felt that they would benefit from undergoing total joint replacement. Risks and benefits of the procedure have been discussed with the patient and they elect to proceed with surgery. There are no active contraindications to surgery such as ongoing infection or rapidly progressive neurological disease.  Problem List Osteoarthritis, Hip (715.35)  Allergies Morphine Sulfate (Concentrate) *ANALGESICS - OPIOID*. Nausea.  Family History Mother. Diabetes Siblings. Patient had 7 brothers all of whom had diabetes. All 7 brothers are deceased.  Social History Tobacco use. Never smoker. Living situation. Lives alone. Marital status. Separated. Advance Directives. Living Will Post-Surgical Plans. Plan is to go home.  Medication History NovoLIN 70/30 ((70-30) 100UNIT/ML Suspension, Subcutaneous)  Active. Ibuprofen (600MG  Tablet, Oral) Active. Iron (65MG  Tablet, Oral) Active. Aspirin Childrens (81MG  Tablet Chewable, Oral) Active. Lisinopril (2.5MG  Tablet, Oral) Active.  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 Skin:Not Present- Hives, Itching, Rash, Eczema and Lesions. HEENT:Not Present- Tinnitus, Headache, Double Vision, Visual Loss, Hearing Loss and Dentures. Respiratory:Not Present- Shortness of breath with exertion, Shortness of breath at rest, Allergies, Coughing up blood and Chronic Cough. Cardiovascular:Not Present- Chest Pain, Racing/skipping heartbeats, Difficulty Breathing Lying Down, Murmur, Swelling and Palpitations. Gastrointestinal:Not Present- Bloody Stool, Heartburn, Abdominal Pain, Vomiting, Nausea, Constipation, Diarrhea, Difficulty Swallowing, Jaundice and Loss of appetitie. Male Genitourinary:Not Present- Urinary frequency, Blood in Urine, Weak urinary stream, Discharge, Flank Pain, Incontinence, Painful Urination, Urgency, Urinary Retention and Urinating at Night. Musculoskeletal:Present- Joint Pain and Morning Stiffness. Not Present- Muscle Weakness, Muscle Pain, Joint Swelling, Back Pain and Spasms. Neurological:Not Present- Tremor, Dizziness, Blackout spells, Paralysis, Difficulty with balance and Weakness. Psychiatric:Not Present- Insomnia.   Vitals Weight: 198 lb Height: 69 in Weight was reported by patient. Height was reported by patient. Body Surface Area: 2.09 m Body Mass Index: 29.24 kg/m Pulse: 62 (Regular) Resp.: 14 (Unlabored) BP: 144/66 (Sitting, Left Arm,  Standard)  Physical Exam The physical exam findings are as follows:  Note: Patient is a 79 year old male with continued hip pain. Patient is accompanied today by his son.   General Mental Status - Alert, cooperative and good historian. General Appearance- pleasant.  Not in acute distress. Orientation- Oriented X3. Build & Nutrition- Well nourished and Well developed.   Head and Neck Head- normocephalic, atraumatic . Neck Global Assessment- supple. no bruit auscultated on the right and no bruit auscultated on the left.   Eye Vision- Wears corrective lenses. Pupil- Bilateral- Regular and Round. Motion- Bilateral- EOMI. glasses  Chest and Lung Exam Auscultation: Breath sounds:- clear at anterior chest wall and - clear at posterior chest wall. Adventitious sounds:- No Adventitious sounds.   Cardiovascular Auscultation:Rhythm- Regular rate and rhythm. Heart Sounds- S1 WNL and S2 WNL. Murmurs & Other Heart Sounds:Auscultation of the heart reveals - No Murmurs.   Abdomen Palpation/Percussion:Tenderness- Abdomen is non-tender to palpation. Rigidity (guarding)- Abdomen is soft. Auscultation:Auscultation of the abdomen reveals - Bowel sounds normal.   Male Genitourinary Not done, not pertinent to present illness  Musculoskeletal He is a well developed male. He is alert and oriented and in no apparent distress. The right hip has flexion to 105, internal rotation to 10, 10 external rotation and abduction.  RADIOGRAPHS: AP pelvis and lateral of both hips show right hip has significant narrowing with minimal joint space left. There are no bony erosions on the right.  Assessment & Plan Osteoarthritis, Hip (715.35) Impression: Right Hip  Note: Plan is for a Right Total Hip Replacement by Dr. Lequita Halt.  Plan is to go home with his daughter.  PCP - Dr. Dwana Melena - Patient has been seen preoperatively and felt to be stable for surgery. Patient  needs close monitoring for sugars, cut insulin dose in half prior to surgery, hold aspirin.  Signed electronically by Roberts Gaudy, PA-C

## 2012-02-24 ENCOUNTER — Inpatient Hospital Stay (HOSPITAL_COMMUNITY)
Admission: RE | Admit: 2012-02-24 | Discharge: 2012-02-27 | DRG: 470 | Disposition: A | Discharge status: Home Health | Payer: Medicare Other | Source: Ambulatory Visit | Attending: Orthopedic Surgery | Admitting: Orthopedic Surgery

## 2012-02-24 ENCOUNTER — Encounter (HOSPITAL_COMMUNITY): Payer: Self-pay

## 2012-02-24 ENCOUNTER — Inpatient Hospital Stay (HOSPITAL_COMMUNITY): Payer: Medicare Other

## 2012-02-24 ENCOUNTER — Inpatient Hospital Stay (HOSPITAL_COMMUNITY): Payer: Medicare Other | Admitting: Anesthesiology

## 2012-02-24 ENCOUNTER — Encounter (HOSPITAL_COMMUNITY): Payer: Self-pay | Admitting: Anesthesiology

## 2012-02-24 ENCOUNTER — Encounter (HOSPITAL_COMMUNITY)
Admission: RE | Disposition: A | Discharge status: Home Health | Payer: Self-pay | Source: Ambulatory Visit | Attending: Orthopedic Surgery

## 2012-02-24 DIAGNOSIS — Z96649 Presence of unspecified artificial hip joint: Secondary | ICD-10-CM | POA: Diagnosis not present

## 2012-02-24 DIAGNOSIS — Z471 Aftercare following joint replacement surgery: Secondary | ICD-10-CM | POA: Diagnosis not present

## 2012-02-24 DIAGNOSIS — Z96659 Presence of unspecified artificial knee joint: Secondary | ICD-10-CM

## 2012-02-24 DIAGNOSIS — E1149 Type 2 diabetes mellitus with other diabetic neurological complication: Secondary | ICD-10-CM | POA: Diagnosis present

## 2012-02-24 DIAGNOSIS — E1142 Type 2 diabetes mellitus with diabetic polyneuropathy: Secondary | ICD-10-CM | POA: Diagnosis present

## 2012-02-24 DIAGNOSIS — M169 Osteoarthritis of hip, unspecified: Principal | ICD-10-CM | POA: Diagnosis present

## 2012-02-24 DIAGNOSIS — Z794 Long term (current) use of insulin: Secondary | ICD-10-CM

## 2012-02-24 DIAGNOSIS — E871 Hypo-osmolality and hyponatremia: Secondary | ICD-10-CM | POA: Diagnosis not present

## 2012-02-24 DIAGNOSIS — M1610 Unilateral primary osteoarthritis, unspecified hip: Secondary | ICD-10-CM | POA: Diagnosis not present

## 2012-02-24 DIAGNOSIS — Z79899 Other long term (current) drug therapy: Secondary | ICD-10-CM

## 2012-02-24 HISTORY — PX: TOTAL HIP ARTHROPLASTY: SHX124

## 2012-02-24 LAB — BASIC METABOLIC PANEL
BUN: 26 mg/dL — ABNORMAL HIGH (ref 6–23)
Chloride: 99 mEq/L (ref 96–112)
GFR calc Af Amer: 69 mL/min — ABNORMAL LOW (ref >90)
Potassium: 4.6 mEq/L (ref 3.5–5.1)
Sodium: 133 mEq/L — ABNORMAL LOW (ref 135–145)

## 2012-02-24 LAB — GLUCOSE, CAPILLARY
Glucose-Capillary: 131 mg/dL — ABNORMAL HIGH (ref 70–99)
Glucose-Capillary: 193 mg/dL — ABNORMAL HIGH (ref 70–99)

## 2012-02-24 SURGERY — ARTHROPLASTY, HIP, TOTAL,POSTERIOR APPROACH
Anesthesia: Spinal | Site: Hip | Laterality: Right | Wound class: Clean

## 2012-02-24 MED ORDER — SODIUM CHLORIDE 0.9 % IV SOLN
INTRAVENOUS | Status: DC
  Administered 2012-02-24 – 2012-02-25 (×3): via INTRAVENOUS

## 2012-02-24 MED ORDER — DOCUSATE SODIUM 100 MG PO CAPS
100.0000 mg | ORAL_CAPSULE | Freq: Two times a day (BID) | ORAL | Status: DC
  Administered 2012-02-25 – 2012-02-27 (×5): 100 mg via ORAL

## 2012-02-24 MED ORDER — METOCLOPRAMIDE HCL 5 MG/ML IJ SOLN: 10.0000 mg | Freq: Once | INTRAMUSCULAR | Status: DC | PRN

## 2012-02-24 MED ORDER — CHLORHEXIDINE GLUCONATE 4 % EX LIQD: 60.0000 mL | Freq: Once | CUTANEOUS | Status: DC

## 2012-02-24 MED ORDER — ACETAMINOPHEN 10 MG/ML IV SOLN
1000.0000 mg | Freq: Once | INTRAVENOUS | Status: AC
  Administered 2012-02-24: 1000 mg via INTRAVENOUS

## 2012-02-24 MED ORDER — BISACODYL 10 MG RE SUPP: 10.0000 mg | Freq: Every day | RECTAL | Status: DC | PRN

## 2012-02-24 MED ORDER — INSULIN ASPART PROT & ASPART (70-30 MIX) 100 UNIT/ML ~~LOC~~ SUSP
15.0000 [IU] | Freq: Two times a day (BID) | SUBCUTANEOUS | Status: DC
  Administered 2012-02-25 – 2012-02-27 (×5): 15 [IU] via SUBCUTANEOUS
  Filled 2012-02-24: qty 10

## 2012-02-24 MED ORDER — LACTATED RINGERS IV SOLN
INTRAVENOUS | Status: DC | PRN
  Administered 2012-02-24 (×2): via INTRAVENOUS

## 2012-02-24 MED ORDER — PHENYLEPHRINE HCL 10 MG/ML IJ SOLN
10.0000 mg | INTRAVENOUS | Status: DC | PRN
  Administered 2012-02-24: 10 ug/min via INTRAVENOUS

## 2012-02-24 MED ORDER — FLEET ENEMA 7-19 GM/118ML RE ENEM: 1.0000 | ENEMA | Freq: Once | RECTAL | Status: AC | PRN

## 2012-02-24 MED ORDER — RIVAROXABAN 10 MG PO TABS
10.0000 mg | ORAL_TABLET | Freq: Every day | ORAL | Status: DC
  Administered 2012-02-25 – 2012-02-27 (×3): 10 mg via ORAL
  Filled 2012-02-24 (×4): qty 1

## 2012-02-24 MED ORDER — PROPOFOL 10 MG/ML IV EMUL
INTRAVENOUS | Status: DC | PRN
  Administered 2012-02-24: 75 ug/kg/min via INTRAVENOUS

## 2012-02-24 MED ORDER — BUPIVACAINE IN DEXTROSE 0.75-8.25 % IT SOLN
INTRATHECAL | Status: DC | PRN
  Administered 2012-02-24: 1.6 mL via INTRATHECAL

## 2012-02-24 MED ORDER — SODIUM CHLORIDE 0.9 % IV SOLN: INTRAVENOUS | Status: DC

## 2012-02-24 MED ORDER — 0.9 % SODIUM CHLORIDE (POUR BTL) OPTIME
TOPICAL | Status: DC | PRN
  Administered 2012-02-24: 1000 mL

## 2012-02-24 MED ORDER — OXYCODONE HCL 5 MG PO TABS
5.0000 mg | ORAL_TABLET | ORAL | Status: DC | PRN
  Administered 2012-02-25 – 2012-02-27 (×7): 10 mg via ORAL
  Filled 2012-02-24 (×7): qty 2

## 2012-02-24 MED ORDER — FENTANYL CITRATE 0.05 MG/ML IJ SOLN: 25.0000 ug | INTRAMUSCULAR | Status: DC | PRN

## 2012-02-24 MED ORDER — DIPHENHYDRAMINE HCL 12.5 MG/5ML PO ELIX: 12.5000 mg | ORAL_SOLUTION | ORAL | Status: DC | PRN

## 2012-02-24 MED ORDER — ACETAMINOPHEN 325 MG PO TABS: 650.0000 mg | ORAL_TABLET | Freq: Four times a day (QID) | ORAL | Status: DC | PRN

## 2012-02-24 MED ORDER — MENTHOL 3 MG MT LOZG: 1.0000 | LOZENGE | OROMUCOSAL | Status: DC | PRN

## 2012-02-24 MED ORDER — METOCLOPRAMIDE HCL 5 MG/ML IJ SOLN
5.0000 mg | Freq: Three times a day (TID) | INTRAMUSCULAR | Status: DC | PRN
  Administered 2012-02-24: 10 mg via INTRAVENOUS
  Filled 2012-02-24: qty 2

## 2012-02-24 MED ORDER — PHENOL 1.4 % MT LIQD: 1.0000 | OROMUCOSAL | Status: DC | PRN

## 2012-02-24 MED ORDER — HYDROMORPHONE HCL PF 1 MG/ML IJ SOLN
0.5000 mg | INTRAMUSCULAR | Status: DC | PRN
  Administered 2012-02-24 (×2): 1 mg via INTRAVENOUS
  Filled 2012-02-24 (×2): qty 1

## 2012-02-24 MED ORDER — ONDANSETRON HCL 4 MG PO TABS: 4.0000 mg | ORAL_TABLET | Freq: Four times a day (QID) | ORAL | Status: DC | PRN

## 2012-02-24 MED ORDER — INSULIN ASPART 100 UNIT/ML ~~LOC~~ SOLN
0.0000 [IU] | Freq: Three times a day (TID) | SUBCUTANEOUS | Status: DC
Start: 2012-02-24 — End: 2012-02-27
  Administered 2012-02-24: 3 [IU] via SUBCUTANEOUS
  Administered 2012-02-25: 8 [IU] via SUBCUTANEOUS
  Administered 2012-02-25 (×2): 3 [IU] via SUBCUTANEOUS
  Administered 2012-02-26: 2 [IU] via SUBCUTANEOUS
  Administered 2012-02-26: 3 [IU] via SUBCUTANEOUS
  Administered 2012-02-26: 5 [IU] via SUBCUTANEOUS
  Administered 2012-02-27: 10:00:00 via SUBCUTANEOUS

## 2012-02-24 MED ORDER — ONDANSETRON HCL 4 MG/2ML IJ SOLN
INTRAMUSCULAR | Status: DC | PRN
  Administered 2012-02-24: 4 mg via INTRAVENOUS

## 2012-02-24 MED ORDER — DEXTROSE 5 % IV SOLN
500.0000 mg | Freq: Four times a day (QID) | INTRAVENOUS | Status: DC | PRN
  Administered 2012-02-24 – 2012-02-25 (×3): 500 mg via INTRAVENOUS
  Filled 2012-02-24 (×4): qty 5

## 2012-02-24 MED ORDER — CEFAZOLIN SODIUM-DEXTROSE 2-3 GM-% IV SOLR
2.0000 g | INTRAVENOUS | Status: AC
  Administered 2012-02-24: 2 g via INTRAVENOUS

## 2012-02-24 MED ORDER — ACETAMINOPHEN 650 MG RE SUPP: 650.0000 mg | Freq: Four times a day (QID) | RECTAL | Status: DC | PRN

## 2012-02-24 MED ORDER — TRAMADOL HCL 50 MG PO TABS: 50.0000 mg | ORAL_TABLET | Freq: Four times a day (QID) | ORAL | Status: DC | PRN

## 2012-02-24 MED ORDER — ONDANSETRON HCL 4 MG/2ML IJ SOLN
4.0000 mg | Freq: Four times a day (QID) | INTRAMUSCULAR | Status: DC | PRN
  Administered 2012-02-24 (×2): 4 mg via INTRAVENOUS
  Filled 2012-02-24 (×2): qty 2

## 2012-02-24 MED ORDER — CEFAZOLIN SODIUM 1-5 GM-% IV SOLN
1.0000 g | Freq: Four times a day (QID) | INTRAVENOUS | Status: AC
  Administered 2012-02-24 (×2): 1 g via INTRAVENOUS
  Filled 2012-02-24 (×2): qty 50

## 2012-02-24 MED ORDER — BUPIVACAINE LIPOSOME 1.3 % IJ SUSP
20.0000 mL | Freq: Once | INTRAMUSCULAR | Status: AC
  Administered 2012-02-24: 20 mL
  Filled 2012-02-24: qty 20

## 2012-02-24 MED ORDER — METOCLOPRAMIDE HCL 10 MG PO TABS: 5.0000 mg | ORAL_TABLET | Freq: Three times a day (TID) | ORAL | Status: DC | PRN

## 2012-02-24 MED ORDER — POLYETHYLENE GLYCOL 3350 17 G PO PACK: 17.0000 g | PACK | Freq: Every day | ORAL | Status: DC | PRN

## 2012-02-24 MED ORDER — KETAMINE HCL 10 MG/ML IJ SOLN
INTRAMUSCULAR | Status: DC | PRN
  Administered 2012-02-24 (×5): 10 mg via INTRAVENOUS

## 2012-02-24 MED ORDER — MIDAZOLAM HCL 5 MG/5ML IJ SOLN
INTRAMUSCULAR | Status: DC | PRN
  Administered 2012-02-24: 2 mg via INTRAVENOUS

## 2012-02-24 MED ORDER — SODIUM CHLORIDE 0.9 % IJ SOLN
INTRAMUSCULAR | Status: DC | PRN
  Administered 2012-02-24: 50 mL via INTRAVENOUS

## 2012-02-24 MED ORDER — ACETAMINOPHEN 10 MG/ML IV SOLN
1000.0000 mg | Freq: Four times a day (QID) | INTRAVENOUS | Status: AC
  Administered 2012-02-24 – 2012-02-25 (×4): 1000 mg via INTRAVENOUS
  Filled 2012-02-24 (×6): qty 100

## 2012-02-24 MED ORDER — METHOCARBAMOL 500 MG PO TABS
500.0000 mg | ORAL_TABLET | Freq: Four times a day (QID) | ORAL | Status: DC | PRN
  Administered 2012-02-25 – 2012-02-26 (×2): 500 mg via ORAL
  Filled 2012-02-24 (×2): qty 1

## 2012-02-24 SURGICAL SUPPLY — 49 items
BAG ZIPLOCK 12X15 (MISCELLANEOUS) ×2 IMPLANT
BIT DRILL 2.8X128 (BIT) ×2 IMPLANT
BLADE EXTENDED COATED 6.5IN (ELECTRODE) ×2 IMPLANT
BLADE SAW SAG 73X25 THK (BLADE) ×1
BLADE SAW SGTL 73X25 THK (BLADE) ×1 IMPLANT
CLOTH BEACON ORANGE TIMEOUT ST (SAFETY) ×2 IMPLANT
DECANTER SPIKE VIAL GLASS SM (MISCELLANEOUS) ×2 IMPLANT
DRAPE INCISE IOBAN 66X45 STRL (DRAPES) ×2 IMPLANT
DRAPE ORTHO SPLIT 77X108 STRL (DRAPES) ×2
DRAPE POUCH INSTRU U-SHP 10X18 (DRAPES) ×2 IMPLANT
DRAPE SURG ORHT 6 SPLT 77X108 (DRAPES) ×2 IMPLANT
DRAPE U-SHAPE 47X51 STRL (DRAPES) ×2 IMPLANT
DRSG ADAPTIC 3X8 NADH LF (GAUZE/BANDAGES/DRESSINGS) ×2 IMPLANT
DRSG MEPILEX BORDER 4X4 (GAUZE/BANDAGES/DRESSINGS) IMPLANT
DRSG MEPILEX BORDER 4X8 (GAUZE/BANDAGES/DRESSINGS) ×2 IMPLANT
DURAPREP 26ML APPLICATOR (WOUND CARE) ×2 IMPLANT
ELECT REM PT RETURN 9FT ADLT (ELECTROSURGICAL) ×2
ELECTRODE REM PT RTRN 9FT ADLT (ELECTROSURGICAL) ×1 IMPLANT
EVACUATOR 1/8 PVC DRAIN (DRAIN) ×2 IMPLANT
FACESHIELD LNG OPTICON STERILE (SAFETY) ×8 IMPLANT
GLOVE BIO SURGEON STRL SZ7.5 (GLOVE) ×2 IMPLANT
GLOVE BIO SURGEON STRL SZ8 (GLOVE) ×2 IMPLANT
GLOVE BIOGEL PI IND STRL 8 (GLOVE) ×2 IMPLANT
GLOVE BIOGEL PI INDICATOR 8 (GLOVE) ×2
GLOVE SURG SS PI 6.5 STRL IVOR (GLOVE) ×4 IMPLANT
GOWN STRL NON-REIN LRG LVL3 (GOWN DISPOSABLE) ×4 IMPLANT
GOWN STRL REIN XL XLG (GOWN DISPOSABLE) ×2 IMPLANT
IMMOBILIZER KNEE 20 (SOFTGOODS) ×2
IMMOBILIZER KNEE 20 THIGH 36 (SOFTGOODS) ×1 IMPLANT
KIT BASIN OR (CUSTOM PROCEDURE TRAY) ×2 IMPLANT
MANIFOLD NEPTUNE II (INSTRUMENTS) ×2 IMPLANT
NDL SAFETY ECLIPSE 18X1.5 (NEEDLE) ×1 IMPLANT
NEEDLE HYPO 18GX1.5 SHARP (NEEDLE) ×1
NS IRRIG 1000ML POUR BTL (IV SOLUTION) ×2 IMPLANT
PACK TOTAL JOINT (CUSTOM PROCEDURE TRAY) ×2 IMPLANT
PASSER SUT SWANSON 36MM LOOP (INSTRUMENTS) ×2 IMPLANT
POSITIONER SURGICAL ARM (MISCELLANEOUS) ×2 IMPLANT
SPONGE GAUZE 4X4 12PLY (GAUZE/BANDAGES/DRESSINGS) ×2 IMPLANT
STRIP CLOSURE SKIN 1/2X4 (GAUZE/BANDAGES/DRESSINGS) ×4 IMPLANT
SUT ETHIBOND NAB CT1 #1 30IN (SUTURE) ×4 IMPLANT
SUT MNCRL AB 4-0 PS2 18 (SUTURE) ×2 IMPLANT
SUT VIC AB 2-0 CT1 27 (SUTURE) ×3
SUT VIC AB 2-0 CT1 TAPERPNT 27 (SUTURE) ×3 IMPLANT
SUT VLOC 180 0 24IN GS25 (SUTURE) ×2 IMPLANT
SYR 50ML LL SCALE MARK (SYRINGE) ×2 IMPLANT
TOWEL OR 17X26 10 PK STRL BLUE (TOWEL DISPOSABLE) ×4 IMPLANT
TOWEL OR NON WOVEN STRL DISP B (DISPOSABLE) ×2 IMPLANT
TRAY FOLEY CATH 14FRSI W/METER (CATHETERS) ×2 IMPLANT
WATER STERILE IRR 1500ML POUR (IV SOLUTION) ×4 IMPLANT

## 2012-02-24 NOTE — Preoperative (Signed)
Beta Blockers   Reason not to administer Beta Blockers:Not Applicable 

## 2012-02-24 NOTE — Anesthesia Preprocedure Evaluation (Addendum)
Anesthesia Evaluation  Patient identified by MRN, date of birth, ID band Patient awake    Reviewed: Allergy & Precautions, H&P , NPO status , Patient's Chart, lab work & pertinent test results, reviewed documented beta blocker date and time   Airway Mallampati: II TM Distance: >3 FB Neck ROM: full    Dental   Pulmonary neg pulmonary ROS,  breath sounds clear to auscultation        Cardiovascular negative cardio ROS  Rhythm:regular     Neuro/Psych negative neurological ROS  negative psych ROS   GI/Hepatic negative GI ROS, Neg liver ROS,   Endo/Other  diabetes, Insulin Dependent  Renal/GU negative Renal ROS  negative genitourinary   Musculoskeletal   Abdominal   Peds  Hematology  (+) anemia ,   Anesthesia Other Findings See surgeon's H&P   Reproductive/Obstetrics negative OB ROS                           Anesthesia Physical Anesthesia Plan  ASA: III  Anesthesia Plan: Spinal   Post-op Pain Management:    Induction: Intravenous  Airway Management Planned: Simple Face Mask  Additional Equipment:   Intra-op Plan:   Post-operative Plan:   Informed Consent: I have reviewed the patients History and Physical, chart, labs and discussed the procedure including the risks, benefits and alternatives for the proposed anesthesia with the patient or authorized representative who has indicated his/her understanding and acceptance.   Dental Advisory Given  Plan Discussed with: CRNA and Surgeon  Anesthesia Plan Comments:         Anesthesia Quick Evaluation

## 2012-02-24 NOTE — Op Note (Signed)
Pre-operative diagnosis- Osteoarthritis Right hip  Post-operative diagnosis- Osteoarthritis  Right hip  Procedure-  RightTotal Hip Arthroplasty  Surgeon- Gus Rankin. Imanol Bihl, MD  Assistant- Avel Peace, Pa-C   Anesthesia  Spinal  EBL- 500   Drain Hemovac   Complication- None  Condition-PACU - hemodynamically stable.   Brief Clinical Note- [name] is a 79 y.o. male with end stage arthritis of his right hip with progressively worsening pain and dysfunction. Pain occurs with activity and rest including pain at night. He has tried analgesics, protected weight bearing and rest without benefit. Pain is too severe to attempt physical therapy. Radiographs demonstrate bone on bone arthritis with subchondral cyst formation. He presents now for right THA.  Procedure in detail-   The patient is brought into the operating room and placed on the operating table. After successful administration of Spinal  anesthesia, the patient is placed in the  Left lateral decubitus position with the  Right side up and held in place with the hip positioner. The lower extremity is isolated from the perineum with plastic drapes and time-out is performed by the surgical team. The lower extremity is then prepped and draped in the usual sterile fashion. A short posterolateral incision is made with a ten blade through the subcutaneous tissue to the level of the fascia lata which is incised in line with the skin incision. The sciatic nerve is palpated and protected and the short external rotators and capsule are isolated from the femur. The hip is then dislocated and the center of the femoral head is marked. A trial prosthesis is placed such that the trial head corresponds to the center of the patients' native femoral head. The resection level is marked on the femoral neck and the resection is made with an oscillating saw. The femoral head is removed and femoral retractors placed to gain access to the femoral canal.     The canal finder is passed into the femoral canal and the canal is thoroughly irrigated with sterile saline to remove the fatty contents. Axial reaming is performed to 13.5  mm, proximal reaming to 18 D  and the sleeve machined to a small . A 18 D small trial sleeve is placed into the proximal femur.      The femur is then retracted anteriorly to gain acetabular exposure. Acetabular retractors are placed and the labrum and osteophytes are removed, Acetabular reaming is performed to 55  mm and a 56  mm Pinnacle acetabular shell is placed in anatomic position with excellent purchase. Additional dome screws were not needed. The permanent 36 mm neutral + 4 Marathon liner is placed into the acetabular shell.      The trial femur is then placed into the femoral canal. The size is 18 x 13  stem with a 36 + 12  neck and a 36 + 0 head with the neck version matching  the patients' native anteversion. The hip is reduced with excellent stability with full extension and full external rotation, 70 degrees flexion with 40 degrees adduction and 90 degrees internal rotation and 90 degrees of flexion with 70 degrees of internal rotation. The operative leg is placed on top of the non-operative leg and the leg lengths are found to be equal. The trials are then removed and the permanent implant of the same size is impacted into the femoral canal. The   metal femoral head of the same size as the trial is placed and the hip is reduced with the same  stability parameters. The operative leg is again placed on top of the non-operative leg and the leg lengths are found to be equal.      The wound is then copiously irrigated with saline solution and the capsule and short external rotators are re-attached to the femur through drill holes with Ethibond suture. The fascia lata is closed over a hemovac drain with #1 vicryl suture and the fascia lata, gluteal muscles and subcutaneous tissues are injected with Exparel 20ml diluted with saline  50ml. The subcutaneous tissues are closed with #1 and2-0 vicryl and the subcuticular layer closed with running 4-0 Monocryl. The drain is hooked to suction, incision cleaned and dried, and steri-srips and a bulky sterile dressing applied. The limb is placed into a knee immobilizer and the patient is awakened and transported to recovery in stable condition.      Please note that a surgical assistant was a medical necessity for this procedure in order to perform it in a safe and expeditious manner. The assistant was necessary to provide retraction to the vital neurovascular structures and to retract and position the limb to allow for anatomic placement of the prosthetic components.  Gus Rankin Starasia Sinko, MD    02/24/2012, 12:19 PM

## 2012-02-24 NOTE — H&P (View-Only) (Signed)
[name]  DOB: 11/14/1932 Divorced / Language: English / Race: White Male  Date of Admission:  02/24/2012  Chief Complaint:  Right Hip Pain  History of Present Illness The patient is a 79 year old male who comes in for a preoperative History and Physical. The patient is scheduled for a right total hip arthroplasty to be performed by Dr. Frank V. Aluisio, MD at [facility] on 02/24/2012. He has a very hard time walking. He feels a lot of stiffness in his hip. The right hip makes noise like it did when he was bone on bone. He has no pain at all in the right knee. The patient has previously undergone bilateral knee replacement and also a left total hip replacement. He has been doing well following these three procedures and is now ready to proceed with the right total hip surgery. They have been treated conservatively in the past for the above stated problem and despite conservative measures, they continue to have progressive pain and severe functional limitations and dysfunction. They have failed non-operative management including home exercise, medications. It is felt that they would benefit from undergoing total joint replacement. Risks and benefits of the procedure have been discussed with the patient and they elect to proceed with surgery. There are no active contraindications to surgery such as ongoing infection or rapidly progressive neurological disease.  Problem List Osteoarthritis, Hip (715.35)  Allergies Morphine Sulfate (Concentrate) *ANALGESICS - OPIOID*. Nausea.  Family History Mother. Diabetes Siblings. Patient had 7 brothers all of whom had diabetes. All 7 brothers are deceased.  Social History Tobacco use. Never smoker. Living situation. Lives alone. Marital status. Separated. Advance Directives. Living Will Post-Surgical Plans. Plan is to go home.  Medication History NovoLIN 70/30 ((70-30) 100UNIT/ML Suspension, Subcutaneous)  Active. Ibuprofen (600MG Tablet, Oral) Active. Iron (65MG Tablet, Oral) Active. Aspirin Childrens (81MG Tablet Chewable, Oral) Active. Lisinopril (2.5MG Tablet, Oral) Active.  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 Skin:Not Present- Hives, Itching, Rash, Eczema and Lesions. HEENT:Not Present- Tinnitus, Headache, Double Vision, Visual Loss, Hearing Loss and Dentures. Respiratory:Not Present- Shortness of breath with exertion, Shortness of breath at rest, Allergies, Coughing up blood and Chronic Cough. Cardiovascular:Not Present- Chest Pain, Racing/skipping heartbeats, Difficulty Breathing Lying Down, Murmur, Swelling and Palpitations. Gastrointestinal:Not Present- Bloody Stool, Heartburn, Abdominal Pain, Vomiting, Nausea, Constipation, Diarrhea, Difficulty Swallowing, Jaundice and Loss of appetitie. Male Genitourinary:Not Present- Urinary frequency, Blood in Urine, Weak urinary stream, Discharge, Flank Pain, Incontinence, Painful Urination, Urgency, Urinary Retention and Urinating at Night. Musculoskeletal:Present- Joint Pain and Morning Stiffness. Not Present- Muscle Weakness, Muscle Pain, Joint Swelling, Back Pain and Spasms. Neurological:Not Present- Tremor, Dizziness, Blackout spells, Paralysis, Difficulty with balance and Weakness. Psychiatric:Not Present- Insomnia.   Vitals Weight: 198 lb Height: 69 in Weight was reported by patient. Height was reported by patient. Body Surface Area: 2.09 m Body Mass Index: 29.24 kg/m Pulse: 62 (Regular) Resp.: 14 (Unlabored) BP: 144/66 (Sitting, Left Arm,  Standard)  Physical Exam The physical exam findings are as follows:  Note: Patient is a 79 year old male with continued hip pain. Patient is accompanied today by his son.   General Mental Status - Alert, cooperative and good historian. General Appearance- pleasant.   Not in acute distress. Orientation- Oriented X3. Build & Nutrition- Well nourished and Well developed.   Head and Neck Head- normocephalic, atraumatic . Neck Global Assessment- supple. no bruit auscultated on the right and no bruit auscultated on the left.   Eye Vision- Wears corrective lenses. Pupil- Bilateral- Regular and Round. Motion- Bilateral- EOMI. glasses  Chest and Lung Exam Auscultation: Breath sounds:- clear at anterior chest wall and - clear at posterior chest wall. Adventitious sounds:- No Adventitious sounds.   Cardiovascular Auscultation:Rhythm- Regular rate and rhythm. Heart Sounds- S1 WNL and S2 WNL. Murmurs & Other Heart Sounds:Auscultation of the heart reveals - No Murmurs.   Abdomen Palpation/Percussion:Tenderness- Abdomen is non-tender to palpation. Rigidity (guarding)- Abdomen is soft. Auscultation:Auscultation of the abdomen reveals - Bowel sounds normal.   Male Genitourinary Not done, not pertinent to present illness  Musculoskeletal He is a well developed male. He is alert and oriented and in no apparent distress. The right hip has flexion to 105, internal rotation to 10, 10 external rotation and abduction.  RADIOGRAPHS: AP pelvis and lateral of both hips show right hip has significant narrowing with minimal joint space left. There are no bony erosions on the right.  Assessment & Plan Osteoarthritis, Hip (715.35) Impression: Right Hip  Note: Plan is for a Right Total Hip Replacement by Dr. Aluisio.  Plan is to go home with his daughter.  PCP - Dr. Zack Hall - Patient has been seen preoperatively and felt to be stable for surgery. Patient  needs close monitoring for sugars, cut insulin dose in half prior to surgery, hold aspirin.  Signed electronically by DREW L Seanne Chirico, PA-C 

## 2012-02-24 NOTE — Anesthesia Postprocedure Evaluation (Signed)
Anesthesia Post Note  Patient: [name]  Procedure(s) Performed: Procedure(s) (LRB): TOTAL HIP ARTHROPLASTY (Right)  Anesthesia type: Spinal  Patient location: PACU  Post pain: Pain level controlled  Post assessment: Patient's Cardiovascular Status Stable  Last Vitals:  Filed Vitals:   02/24/12 1330  BP: 116/52  Pulse: 69  Temp: 36.3 C  Resp: 17    Post vital signs: Reviewed and stable  Level of consciousness: alert  Complications: No apparent anesthesia complications    Motor function returned below T-12

## 2012-02-24 NOTE — Anesthesia Procedure Notes (Signed)
Spinal  Patient location during procedure: OR Start time: 02/24/2012 11:07 AM End time: 02/24/2012 11:12 AM Staffing Performed by: anesthesiologist  Preanesthetic Checklist Completed: patient identified, site marked, surgical consent, pre-op evaluation, timeout performed, IV checked, risks and benefits discussed and monitors and equipment checked Spinal Block Patient position: sitting Prep: Betadine Patient monitoring: heart rate, cardiac monitor, continuous pulse ox and blood pressure Approach: left paramedian Location: L2-3 Injection technique: single-shot Needle Needle type: Spinocan  Needle gauge: 22 G Needle length: 9 cm Needle insertion depth: 7 cm Assessment Sensory level: T6

## 2012-02-24 NOTE — Progress Notes (Signed)
Utilization review completed.  

## 2012-02-24 NOTE — Interval H&P Note (Signed)
History and Physical Interval Note:  02/24/2012 10:28 AM  Burns Spain  has presented today for surgery, with the diagnosis of OA OF THE RIGHT HIP  The various methods of treatment have been discussed with the patient and family. After consideration of risks, benefits and other options for treatment, the patient has consented to  Procedure(s): TOTAL HIP ARTHROPLASTY (Right) as a surgical intervention .  The patient's history has been reviewed, patient examined, no change in status, stable for surgery.  I have reviewed the patient's chart and labs.  Questions were answered to the patient's satisfaction.     Loanne Drilling

## 2012-02-24 NOTE — Progress Notes (Signed)
Portable AP Pelvis  And AP Right Hip X-rays done

## 2012-02-24 NOTE — Progress Notes (Signed)
PT NOTE: PT deferred POD 0.  Pt with 7/10 pain and requesting MEDS.  Ice pack supplied and precaution sign hung in room.  Will follow in am.

## 2012-02-24 NOTE — Progress Notes (Signed)
[facility]  Patient Status: Active (receiving services up to time of hospitalization)  [facility] is providing the following services: RN.   Noted order for HHPT.    If patient discharges after hours or on weekend, please call [phone].  Thank you  Norberta Keens, RN, BSN [phone] 02/24/2012, 6:11 PM

## 2012-02-24 NOTE — Transfer of Care (Signed)
Immediate Anesthesia Transfer of Care Note  Patient: [name]  Procedure(s) Performed: Procedure(s): TOTAL HIP ARTHROPLASTY (Right)  Patient Location: PACU  Anesthesia Type:MAC and Spinal  Level of Consciousness: sedated, patient cooperative and responds to stimulation  Airway & Oxygen Therapy: Patient Spontanous Breathing and Patient connected to face mask oxygen  Post-op Assessment: Report given to PACU RN and Post -op Vital signs reviewed and stable  Post vital signs: Reviewed and stable  Complications: No apparent anesthesia complications

## 2012-02-25 LAB — GLUCOSE, CAPILLARY
Glucose-Capillary: 177 mg/dL — ABNORMAL HIGH (ref 70–99)
Glucose-Capillary: 279 mg/dL — ABNORMAL HIGH (ref 70–99)
Glucose-Capillary: 149 mg/dL — ABNORMAL HIGH (ref 70–99)

## 2012-02-25 LAB — CBC
Hemoglobin: 10 g/dL — ABNORMAL LOW (ref 13.0–17.0)
MCH: 31.9 pg (ref 26.0–34.0)
MCV: 93.6 fL (ref 78.0–100.0)
RBC: 3.13 MIL/uL — ABNORMAL LOW (ref 4.22–5.81)
WBC: 6.3 10*3/uL (ref 4.0–10.5)

## 2012-02-25 LAB — BASIC METABOLIC PANEL
CO2: 26 mEq/L (ref 19–32)
Calcium: 8.6 mg/dL (ref 8.4–10.5)
Chloride: 100 mEq/L (ref 96–112)
Creatinine, Ser: 1.05 mg/dL (ref 0.50–1.35)
Glucose, Bld: 208 mg/dL — ABNORMAL HIGH (ref 70–99)
Sodium: 132 mEq/L — ABNORMAL LOW (ref 135–145)

## 2012-02-25 MED ORDER — METHOCARBAMOL 500 MG PO TABS: 500.0000 mg | ORAL_TABLET | Freq: Four times a day (QID) | ORAL | Status: DC | PRN

## 2012-02-25 MED ORDER — OXYCODONE HCL 5 MG PO TABS: 5.0000 mg | ORAL_TABLET | ORAL | Status: DC | PRN

## 2012-02-25 MED ORDER — TRAMADOL HCL 50 MG PO TABS: 50.0000 mg | ORAL_TABLET | Freq: Four times a day (QID) | ORAL | Status: DC | PRN

## 2012-02-25 MED ORDER — RIVAROXABAN 10 MG PO TABS: 10.0000 mg | ORAL_TABLET | Freq: Every day | ORAL | Status: DC

## 2012-02-25 NOTE — Evaluation (Signed)
Physical Therapy Evaluation Patient Details Name: [name] MRN: [id number] DOB: 02/26/1932 Today's Date: 02/25/2012 Time: 4782-9562 PT Time Calculation (min): 24 min  PT Assessment / Plan / Recommendation Clinical Impression  Pt s/p R posterior THR.  Pt would benefit from acute PT services in order to improve independence with transfers and ambulation to prepare for d/c home with friends to assist PRN.  Pt reports his other hip and both knees have already been replaced (most recently his L hip in Oct).  Pt states he wishes to return home upon d/c.    PT Assessment  Patient needs continued PT services    Follow Up Recommendations  Home health PT;Supervision for mobility/OOB    Does the patient have the potential to tolerate intense rehabilitation      Barriers to Discharge        Equipment Recommendations  None recommended by PT    Recommendations for Other Services     Frequency 7X/week    Precautions / Restrictions Precautions Precautions: Fall;Posterior Hip Precaution Comments: PHP handout in room already, pt able to verbalize all precautions Restrictions Weight Bearing Restrictions: No   Pertinent Vitals/Pain Premedicated, repositioned in recliner      Mobility  Bed Mobility Bed Mobility: Supine to Sit Supine to Sit: 3: Mod assist;HOB elevated;With rails Details for Bed Mobility Assistance: verbal cues for technique, assist for R LE and trunk upright Transfers Transfers: Sit to Stand;Stand to Sit Sit to Stand: 4: Min assist;With upper extremity assist;From bed;From elevated surface Stand to Sit: 4: Min assist;With upper extremity assist;To chair/3-in-1 Details for Transfer Assistance: verbal cues for technique within precautions, assist to rise and control descent Ambulation/Gait Ambulation/Gait Assistance: 4: Min assist Ambulation Distance (Feet): 30 Feet Assistive device: Rolling walker Ambulation/Gait Assistance Details: verbal cues for sequence, RW  distance, posture (pt reports trunk flexion from arthritis) Gait Pattern: Step-to pattern;Trunk flexed;Antalgic Gait velocity: decreased    Exercises     PT Diagnosis: Difficulty walking  PT Problem List: Decreased strength;Decreased activity tolerance;Decreased range of motion;Decreased mobility;Decreased knowledge of precautions;Decreased knowledge of use of DME;Pain PT Treatment Interventions: DME instruction;Gait training;Stair training;Functional mobility training;Therapeutic activities;Therapeutic exercise;Patient/family education   PT Goals Acute Rehab PT Goals PT Goal Formulation: With patient Time For Goal Achievement: 03/03/12 Potential to Achieve Goals: Good Pt will go Supine/Side to Sit: with modified independence PT Goal: Supine/Side to Sit - Progress: Goal set today Pt will go Sit to Supine/Side: with modified independence PT Goal: Sit to Supine/Side - Progress: Goal set today Pt will go Sit to Stand: with modified independence PT Goal: Sit to Stand - Progress: Goal set today Pt will go Stand to Sit: with modified independence PT Goal: Stand to Sit - Progress: Goal set today Pt will Ambulate: 51 - 150 feet;with modified independence;with rolling walker PT Goal: Ambulate - Progress: Goal set today Pt will Go Up / Down Stairs: 1-2 stairs;with supervision;with least restrictive assistive device;with rail(s) PT Goal: Up/Down Stairs - Progress: Goal set today Pt will Perform Home Exercise Program: with supervision, verbal cues required/provided PT Goal: Perform Home Exercise Program - Progress: Goal set today  Visit Information  Last PT Received On: 02/25/12 Assistance Needed: +1    Subjective Data  Subjective: My hip hasn't really been bothering me; it's mostly my thigh right above the knee but it was doing that before surgery.   Prior Functioning  Home Living Lives With: Alone Available Help at Discharge: Friend(s);Available PRN/intermittently Type of Home:  House Home Access: Stairs to  enter Entrance Stairs-Number of Steps: 1 Entrance Stairs-Rails: Right Home Layout: One level Home Adaptive Equipment: Bedside commode/3-in-1;Walker - rolling;Straight cane Prior Function Level of Independence: Independent with assistive device(s) Driving: Yes Communication Communication: No difficulties    Cognition  Cognition Overall Cognitive Status: Appears within functional limits for tasks assessed/performed Arousal/Alertness: Awake/alert Orientation Level: Appears intact for tasks assessed Behavior During Session: [facility] for tasks performed    Extremity/Trunk Assessment Right Lower Extremity Assessment RLE ROM/Strength/Tone: Deficits RLE ROM/Strength/Tone Deficits: grossly at least 3+/5 throughout per functional observation Left Lower Extremity Assessment LLE ROM/Strength/Tone: [facility] for tasks assessed   Balance    End of Session PT - End of Session Equipment Utilized During Treatment: Gait belt Activity Tolerance: Patient limited by pain Patient left: in chair;with call bell/phone within reach  GP     [name],KATHrine E 02/25/2012, 12:21 PM Zenovia Jarred, PT, DPT 02/25/2012 Pager: [phone]

## 2012-02-25 NOTE — Progress Notes (Signed)
Physical Therapy Treatment Note   02/25/12 1400  PT Visit Information  Last PT Received On 02/25/12  Assistance Needed +1  PT Time Calculation  PT Start Time 1415  PT Stop Time 1430  PT Time Calculation (min) 15 min  Subjective Data  Subjective My leg is sore from walking to the door with that lady.  Precautions  Precautions Fall;Posterior Hip  Restrictions  Weight Bearing Restrictions No  Cognition  Overall Cognitive Status Appears within functional limits for tasks assessed/performed  Arousal/Alertness Awake/alert  Exercises  Exercises Total Joint  Total Joint Exercises  Ankle Circles/Pumps AROM;Both;20 reps  Quad Sets AROM;Both;15 reps  Towel Squeeze AROM;Both;15 reps  Short Arc Quad AROM;Both;15 reps  Heel Slides AAROM;Right;10 reps  Hip ABduction/ADduction AAROM;Right;15 reps  PT - End of Session  Activity Tolerance Patient limited by pain  Patient left in chair;with call bell/phone within reach  PT - Assessment/Plan  Comments on Treatment Session Pt reports medicated before therapy however still with sore/hurting R lower thigh so only wished to perform exercises.  Pt reports he does not want SNF and plans to return home.  PT Plan Discharge plan remains appropriate;Frequency remains appropriate  Follow Up Recommendations Home health PT;Supervision for mobility/OOB  PT equipment None recommended by PT  Acute Rehab PT Goals  PT Goal: Perform Home Exercise Program - Progress Goal set today  PT General Charges  $$ ACUTE PT VISIT 1 Procedure  PT Treatments  $Therapeutic Exercise 8-22 mins   Zenovia Jarred, PT, DPT 02/25/2012 Pager: [phone]

## 2012-02-25 NOTE — Evaluation (Signed)
Occupational Therapy Evaluation Patient Details Name: [name] MRN: [id number] DOB: [birth year] Today's Date: 02/25/2012 Time: 7829-5621 OT Time Calculation (min): 24 min  OT Assessment / Plan / Recommendation Clinical Impression  This 79 y.o. male admitted for Rt. THA (post approach).  Pt reports he recently had Lt. THA, and went to SNF for rehab.  Pt verbalizes unsafe techniques for functional transfers and ADLs.  Pt. will benefit from OT to maximize safety and independence with BADLs to allow pt to return home with supervision.  Pt is insistent on returning home, and is not agreeable to SNF    OT Assessment  Patient needs continued OT Services    Follow Up Recommendations  Home health OT;Supervision/Assistance - 24 hour    Barriers to Discharge Decreased caregiver support    Equipment Recommendations  Tub/shower bench    Recommendations for Other Services    Frequency  Min 2X/week    Precautions / Restrictions Precautions Precautions: Fall;Posterior Hip Precaution Booklet Issued: Yes (comment) Precaution Comments: PHP handout in room already, pt able to verbalize all precautions Restrictions Weight Bearing Restrictions: No       ADL  Eating/Feeding: Independent Where Assessed - Eating/Feeding: Chair Grooming: Wash/dry hands;Wash/dry face;Teeth care;Supervision/safety Where Assessed - Grooming: Supported sitting Upper Body Bathing: Supervision/safety Where Assessed - Upper Body Bathing: Supported sitting Lower Body Bathing: Moderate assistance Where Assessed - Lower Body Bathing: Supported sit to stand Upper Body Dressing: Minimal assistance Where Assessed - Upper Body Dressing: Unsupported sitting Lower Body Dressing: Minimal assistance Where Assessed - Lower Body Dressing: Supported sit to stand (with AE) Toilet Transfer: Minimal assistance Toilet Transfer Method: Sit to stand Toilet Transfer Equipment: Raised toilet seat with arms (or 3-in-1 over  toilet) Toileting - Clothing Manipulation and Hygiene: Minimal assistance Where Assessed - Engineer, mining and Hygiene: Standing Equipment Used: Rolling walker;Reacher;Sock aid ADL Comments: Pt is familiar with sock aid from recent Lt. THA.  Pt. was reinstructed in use.  Pt. requires min A and min vc's for use.  Pt. verbalizes how he performs ADLs at home as he says he knows how to do it due to just having had Lt. THA performed ~3 mos ago; however, pt. verbalizes unsafe techniques for tub transfer, dressing, etc.  Reinforced hip precautions with pt, and voiced safety concerns.      OT Diagnosis: Generalized weakness;Acute pain  OT Problem List: Decreased strength;Decreased activity tolerance;Decreased safety awareness;Decreased knowledge of use of DME or AE;Decreased knowledge of precautions;Pain OT Treatment Interventions: Self-care/ADL training;DME and/or AE instruction;Therapeutic activities;Patient/family education   OT Goals Acute Rehab OT Goals OT Goal Formulation: With patient Time For Goal Achievement: 03/03/12 Potential to Achieve Goals: Good ADL Goals Pt Will Perform Grooming: with supervision;Standing at sink ADL Goal: Grooming - Progress: Goal set today Pt Will Perform Lower Body Bathing: with supervision;Sit to stand from chair;Sit to stand from bed;with adaptive equipment ADL Goal: Lower Body Bathing - Progress: Goal set today Pt Will Perform Lower Body Dressing: with supervision;Sit to stand from chair;Sit to stand from bed;with adaptive equipment ADL Goal: Lower Body Dressing - Progress: Goal set today Pt Will Transfer to Toilet: with supervision;3-in-1;Ambulation;Maintaining hip precautions ADL Goal: Toilet Transfer - Progress: Goal set today Pt Will Perform Toileting - Clothing Manipulation: with supervision;Standing ADL Goal: Toileting - Clothing Manipulation - Progress: Goal set today Pt Will Perform Tub/Shower Transfer: with min assist;Ambulation;Tub  transfer;Maintaining hip precautions ADL Goal: Tub/Shower Transfer - Progress: Goal set today  Visit Information  Last OT Received On: 02/25/12  Assistance Needed: +1    Subjective Data  Subjective: "I use a stick to get dressed, and have someone come help me when I need to put on my socks Patient Stated Goal: To get better and go home   Prior Functioning     Home Living Lives With: Alone Available Help at Discharge: Friend(s);Available PRN/intermittently Type of Home: House Home Access: Stairs to enter [employer] of Steps: 1 Entrance Stairs-Rails: Right Home Layout: One level Bathroom Shower/Tub: Forensic scientist: Standard Bathroom Accessibility: Yes How Accessible: Accessible via walker Home Adaptive Equipment: Bedside commode/3-in-1;Walker - rolling;Straight cane;Reacher;Long-handled shoehorn (dressing stick) Additional Comments: Pt called friends to assist with donning socks and househole management.  Was still receiving HH and an aid would assist with bathing PRN Prior Function Level of Independence: Independent with assistive device(s) Able to Take Stairs?: Yes Driving: Yes Vocation: Retired Musician: No difficulties         Vision/Perception     Copywriter, advertising Overall Cognitive Status: Appears within functional limits for tasks assessed/performed Arousal/Alertness: Awake/alert Orientation Level: Appears intact for tasks assessed Behavior During Session: [facility] for tasks performed    Extremity/Trunk Assessment Right Upper Extremity Assessment RUE ROM/Strength/Tone: Within functional levels RUE Coordination: WFL - gross/fine motor Left Upper Extremity Assessment LUE ROM/Strength/Tone: Within functional levels LUE Coordination: WFL - gross/fine motor               End of Session OT - End of Session Activity Tolerance: Patient tolerated treatment well Patient left: in chair;with call bell/phone  within reach  GO     Shacola Schussler M 02/25/2012, 12:33 PM

## 2012-02-25 NOTE — Progress Notes (Signed)
   Subjective: 1 Day Post-Op Procedure(s) (LRB): TOTAL HIP ARTHROPLASTY (Right) Patient reports pain as 2 on 0-10 scale.   We will start therapy today.  Plan is to go Home after hospital stay.  Objective: Vital signs in last 24 hours: Temp:  [97.4 F (36.3 C)-98.3 F (36.8 C)] 98.3 F (36.8 C) (02/15 0454) Pulse Rate:  [58-75] 67 (02/15 0454) Resp:  [12-19] 16 (02/15 0454) BP: (104-175)/(43-75) 121/69 mmHg (02/15 0454) SpO2:  [100 %] 100 % (02/15 0454) Weight:  [206 lb (93.441 kg)] 206 lb (93.441 kg) (02/14 1434)  Intake/Output from previous day:  Intake/Output Summary (Last 24 hours) at 02/25/12 0900 Last data filed at 02/25/12 1610  Gross per 24 hour  Intake   4875 ml  Output   4100 ml  Net    775 ml    Intake/Output this shift: Total I/O In: -  Out: 100 [Urine:100]  Labs:  Recent Labs  02/25/12 0433  HGB 10.0*    Recent Labs  02/25/12 0433  WBC 6.3  RBC 3.13*  HCT 29.3*  PLT 188    Recent Labs  02/24/12 0900 02/25/12 0433  NA 133* 132*  K 4.6 4.9  CL 99 100  CO2 26 26  BUN 26* 21  CREATININE 1.15 1.05  GLUCOSE 139* 208*  CALCIUM 9.3 8.6   No results found for this basename: LABPT, INR,  in the last 72 hours  EXAM General - Patient is Alert, Appropriate and Oriented Extremity - Neurologically intact Neurovascular intact No cellulitis present Compartment soft Dressing - dressing C/D/I Motor Function - intact, moving foot and toes well on exam.  Hemovac pulled without difficulty.  Past Medical History  Diagnosis Date  . Diabetes mellitus without complication     takes lisinopril for kidneys  . Arthritis   . S/P knee replacement left 2011, right 2012    Assessment/Plan: 1 Day Post-Op Procedure(s) (LRB): TOTAL HIP ARTHROPLASTY (Right) Active Problems:   * No active hospital problems. *   Advance diet Up with therapy D/C IV fluids Plan for discharge tomorrow  DVT Prophylaxis - Xarelto Weight Bearing As Tolerated right  Leg D/C Knee Immobilizer Hemovac Pulled Begin Therapy Hip Preacutions    Loanne Drilling

## 2012-02-26 LAB — CBC
MCH: 32 pg (ref 26.0–34.0)
MCV: 94.4 fL (ref 78.0–100.0)
Platelets: 170 10*3/uL (ref 150–400)
RBC: 2.84 MIL/uL — ABNORMAL LOW (ref 4.22–5.81)
RDW: 13.6 % (ref 11.5–15.5)

## 2012-02-26 LAB — GLUCOSE, CAPILLARY
Glucose-Capillary: 132 mg/dL — ABNORMAL HIGH (ref 70–99)
Glucose-Capillary: 186 mg/dL — ABNORMAL HIGH (ref 70–99)
Glucose-Capillary: 223 mg/dL — ABNORMAL HIGH (ref 70–99)

## 2012-02-26 LAB — BASIC METABOLIC PANEL
CO2: 24 mEq/L (ref 19–32)
Calcium: 8.3 mg/dL — ABNORMAL LOW (ref 8.4–10.5)
Creatinine, Ser: 1.28 mg/dL (ref 0.50–1.35)
GFR calc Af Amer: 61 mL/min — ABNORMAL LOW (ref >90)
GFR calc non Af Amer: 52 mL/min — ABNORMAL LOW (ref >90)
Sodium: 131 mEq/L — ABNORMAL LOW (ref 135–145)

## 2012-02-26 NOTE — Progress Notes (Signed)
Subjective: Patient reports he had a bad day with nausea on Friday little bit behind on this therapy doesn't feel comfortable going home today otherwise no other complaints. He is tolerating food at this time. He is participating with physical therapy.   Objective: Vital signs in last 24 hours: Temp:  [97.8 F (36.6 C)-98.3 F (36.8 C)] 98.1 F (36.7 C) (02/16 0545) Pulse Rate:  [59-82] 69 (02/16 0545) Resp:  [16-20] 20 (02/16 0545) BP: (97-113)/(48-60) 97/48 mmHg (02/16 0545) SpO2:  [96 %-98 %] 96 % (02/16 0545)  Intake/Output from previous day: 02/15 0701 - 02/16 0700 In: 2148.3 [P.O.:720; I.V.:1428.3] Out: 875 [Urine:875] Intake/Output this shift: Total I/O In: -  Out: 175 [Urine:175]   Recent Labs  02/25/12 0433 02/26/12 0423  HGB 10.0* 9.1*    Recent Labs  02/25/12 0433 02/26/12 0423  WBC 6.3 8.2  RBC 3.13* 2.84*  HCT 29.3* 26.8*  PLT 188 170    Recent Labs  02/25/12 0433 02/26/12 0423  NA 132* 131*  K 4.9 4.3  CL 100 102  CO2 26 24  BUN 21 28*  CREATININE 1.05 1.28  GLUCOSE 208* 170*  CALCIUM 8.6 8.3*   No results found for this basename: LABPT, INR,  in the last 72 hours  Patient is conscious alert appropriate sitting up in a bedside chair his right hip his wound is well approximated with Steri-Strips there is no drainage no pressure blisters. Is soft and nontender his leg is neuromotor vascularly  Assessment/Plan: Postop day #2 status post right total hip arthroplasty stable Mild hyponatremia asymptomatic we'll self correct with DC'd IV hydration Plan out of bed with physical therapy today feel more independent we'll consider discharge home on Monday   [name] 02/26/2012, 7:38 AM

## 2012-02-26 NOTE — Progress Notes (Signed)
Physical Therapy Treatment Patient Details Name: [name] MRN: [id number] DOB: 02/07/1932 Today's Date: 02/26/2012 Time: 4098-1191 PT Time Calculation (min): 23 min  PT Assessment / Plan / Recommendation Comments on Treatment Session  Pt progressing well with mobility, ambulated 140' with RW, performed ther ex.  Expect will be ready to DC home tomorrow.     Follow Up Recommendations  Home health PT;Supervision for mobility/OOB     Does the patient have the potential to tolerate intense rehabilitation     Barriers to Discharge        Equipment Recommendations  None recommended by PT    Recommendations for Other Services    Frequency 7X/week   Plan Discharge plan remains appropriate;Frequency remains appropriate    Precautions / Restrictions Precautions Precautions: Fall;Posterior Hip Precaution Booklet Issued: Yes (comment) Precaution Comments: PHP handout in room already, pt able to verbalize all precautions Restrictions Weight Bearing Restrictions: No   Pertinent Vitals/Pain **10/10 R posterior thigh with walking Ice applied, pt refused pain meds*    Mobility  Bed Mobility Bed Mobility: Not assessed Transfers Sit to Stand: With upper extremity assist;From chair/3-in-1;6: Modified independent (Device/Increase time);With armrests Stand to Sit: With upper extremity assist;To chair/3-in-1;6: Modified independent (Device/Increase time);With armrests Ambulation/Gait Ambulation/Gait Assistance: 6: Modified independent (Device/Increase time) Ambulation Distance (Feet): 140 Feet Assistive device: Rolling walker Gait Pattern: Trunk flexed;Step-to pattern    Exercises Total Joint Exercises Ankle Circles/Pumps: AROM;Both;20 reps Short Arc Quad: AROM;20 reps;Right Heel Slides: AAROM;Right;10 reps Hip ABduction/ADduction: AAROM;Right;20 reps Long Arc Quad: AROM;Right;20 reps   PT Diagnosis:    PT Problem List:   PT Treatment Interventions:     PT Goals Acute  Rehab PT Goals PT Goal Formulation: With patient Time For Goal Achievement: 03/03/12 Potential to Achieve Goals: Good Pt will go Supine/Side to Sit: with modified independence Pt will go Sit to Supine/Side: with modified independence Pt will go Sit to Stand: with modified independence PT Goal: Sit to Stand - Progress: Met Pt will go Stand to Sit: with modified independence PT Goal: Stand to Sit - Progress: Met Pt will Ambulate: 51 - 150 feet;with modified independence;with rolling walker PT Goal: Ambulate - Progress: Met Pt will Go Up / Down Stairs: 1-2 stairs;with supervision;with least restrictive assistive device;with rail(s) Pt will Perform Home Exercise Program: with supervision, verbal cues required/provided PT Goal: Perform Home Exercise Program - Progress: Progressing toward goal  Visit Information  Last PT Received On: 02/26/12 Assistance Needed: +1    Subjective Data  Subjective: Therapy again? It seems that's all I do all day is therapy.  Patient Stated Goal: to walk without a cane   Cognition  Cognition Overall Cognitive Status: Appears within functional limits for tasks assessed/performed Arousal/Alertness: Awake/alert Orientation Level: Appears intact for tasks assessed Behavior During Session: [facility] for tasks performed    Balance     End of Session PT - End of Session Activity Tolerance: Patient tolerated treatment well Patient left: in chair;with call bell/phone within reach Nurse Communication: Mobility status   GP     Ralene Bathe Kistler 02/26/2012, 1:48 PM [phone]

## 2012-02-26 NOTE — Progress Notes (Signed)
Occupational Therapy Treatment Patient Details Name: [name] MRN: [id number] DOB: 07/23/1932 Today's Date: 02/26/2012 Time: 9528-4132 OT Time Calculation (min): 34 min  OT Assessment / Plan / Recommendation Comments on Treatment Session Pt able to perform LB ADLs with supervision using AE.  Discussed deferring tub transfer to [facility] therapies to ensure safety in home.  Pt agreeable.     Follow Up Recommendations  Home health OT    Barriers to Discharge       Equipment Recommendations  None recommended by OT    Recommendations for Other Services    Frequency Min 2X/week   Plan Discharge plan remains appropriate    Precautions / Restrictions Precautions Precautions: Fall;Posterior Hip Precaution Booklet Issued: Yes (comment) Precaution Comments: PHP handout in room already, pt able to verbalize all precautions Restrictions Weight Bearing Restrictions: No       ADL  Lower Body Dressing: Supervision/safety (with AE) Where Assessed - Lower Body Dressing: Supported sit to stand Toilet Transfer: Radiographer, therapeutic Method: Sit to Barista: Raised toilet seat with arms (or 3-in-1 over toilet) Toileting - Clothing Manipulation and Hygiene: Supervision/safety Where Assessed - Engineer, mining and Hygiene: Standing Equipment Used: Rolling walker;Reacher;Sock aid Transfers/Ambulation Related to ADLs: supervision ADL Comments: Pt demonstrates ability to use AE for LB ADLs, but requires increased time to complete tasks due to weakness in hands    OT Diagnosis:    OT Problem List:   OT Treatment Interventions:     OT Goals ADL Goals ADL Goal: Lower Body Dressing - Progress: Progressing toward goals ADL Goal: Toilet Transfer - Progress: Progressing toward goals ADL Goal: Toileting - Clothing Manipulation - Progress: Progressing toward goals  Visit Information  Last OT Received On: 02/26/12 Assistance Needed: +1     Subjective Data      Prior Functioning       Cognition  Cognition Overall Cognitive Status: Appears within functional limits for tasks assessed/performed Arousal/Alertness: Awake/alert Orientation Level: Appears intact for tasks assessed Behavior During Session: [facility] for tasks performed    Mobility  Bed Mobility Bed Mobility: Not assessed Transfers Transfers: Sit to Stand;Stand to Sit Sit to Stand: 5: Supervision;With upper extremity assist;From chair/3-in-1 Stand to Sit: 5: Supervision;With upper extremity assist;To chair/3-in-1    Exercises  Total Joint Exercises Ankle Circles/Pumps: AROM;Both;20 reps Quad Sets: AROM;Both;15 reps Short Arc Quad: AROM;20 reps;Right Heel Slides: AAROM;Right;10 reps Hip ABduction/ADduction: AAROM;Right;20 reps Long Arc Quad: AROM;Right;10 reps   Balance     End of Session OT - End of Session Activity Tolerance: Patient tolerated treatment well Patient left: in chair;with call bell/phone within reach  GO     [name] M 02/26/2012, 12:03 PM

## 2012-02-26 NOTE — Progress Notes (Signed)
Physical Therapy Treatment Patient Details Name: [name] MRN: [id number] DOB: October 05, 1932 Today's Date: 02/26/2012 Time: 4098-1191 PT Time Calculation (min): 27 min  PT Assessment / Plan / Recommendation Comments on Treatment Session  Pt progressing well with mobility, ambulated 140' with RW, performed ther ex.  Expect will be ready to DC home tomorrow.     Follow Up Recommendations  Home health PT;Supervision for mobility/OOB     Does the patient have the potential to tolerate intense rehabilitation     Barriers to Discharge        Equipment Recommendations  None recommended by PT    Recommendations for Other Services    Frequency 7X/week   Plan Discharge plan remains appropriate;Frequency remains appropriate    Precautions / Restrictions Precautions Precautions: Fall;Posterior Hip Precaution Booklet Issued: Yes (comment) Precaution Comments: PHP handout in room already, pt able to verbalize all precautions Restrictions Weight Bearing Restrictions: No   Pertinent Vitals/Pain **pt did not rate pain, "I don't know if its a 2 or a 20, it just hurts when I move it" Ice apllied to R hip*    Mobility  Bed Mobility Bed Mobility: Not assessed Transfers Sit to Stand: With upper extremity assist;From chair/3-in-1;6: Modified independent (Device/Increase time) Stand to Sit: With upper extremity assist;To chair/3-in-1;6: Modified independent (Device/Increase time) Ambulation/Gait Ambulation/Gait Assistance: 5: Supervision Ambulation Distance (Feet): 140 Feet Assistive device: Rolling walker Gait Pattern: Step-to pattern;Trunk flexed General Gait Details: pt states he flexes trunk due to arthritis in his back    Exercises Total Joint Exercises Ankle Circles/Pumps: AROM;Both;20 reps Quad Sets: AROM;Both;15 reps Short Arc Quad: AROM;20 reps;Right Heel Slides: AAROM;Right;10 reps Hip ABduction/ADduction: AAROM;Right;20 reps Long Arc Quad: AROM;Right;10 reps   PT  Diagnosis:    PT Problem List:   PT Treatment Interventions:     PT Goals Acute Rehab PT Goals PT Goal Formulation: With patient Time For Goal Achievement: 03/03/12 Potential to Achieve Goals: Good Pt will go Supine/Side to Sit: with modified independence Pt will go Sit to Supine/Side: with modified independence Pt will go Sit to Stand: with modified independence PT Goal: Sit to Stand - Progress: Met Pt will go Stand to Sit: with modified independence PT Goal: Stand to Sit - Progress: Met Pt will Ambulate: 51 - 150 feet;with modified independence;with rolling walker PT Goal: Ambulate - Progress: Progressing toward goal Pt will Go Up / Down Stairs: 1-2 stairs;with supervision;with least restrictive assistive device;with rail(s) Pt will Perform Home Exercise Program: with supervision, verbal cues required/provided PT Goal: Perform Home Exercise Program - Progress: Progressing toward goal  Visit Information  Last PT Received On: 02/26/12 Assistance Needed: +1    Subjective Data  Subjective: I don't know if the pain is a 2 or a 20.  It just hurts when I move it.  Patient Stated Goal: to walk without a cane   Cognition  Cognition Overall Cognitive Status: Appears within functional limits for tasks assessed/performed Arousal/Alertness: Awake/alert Orientation Level: Appears intact for tasks assessed Behavior During Session: [facility] for tasks performed    Balance     End of Session PT - End of Session Activity Tolerance: Patient tolerated treatment well Patient left: in chair;with call bell/phone within reach Nurse Communication: Mobility status   GP     Ralene Bathe Kistler 02/26/2012, 9:25 AM [phone]

## 2012-02-27 LAB — CBC
HCT: 24.7 % — ABNORMAL LOW (ref 39.0–52.0)
Hemoglobin: 8.2 g/dL — ABNORMAL LOW (ref 13.0–17.0)
RBC: 2.62 MIL/uL — ABNORMAL LOW (ref 4.22–5.81)
WBC: 6.4 10*3/uL (ref 4.0–10.5)

## 2012-02-27 NOTE — Discharge Summary (Signed)
Physician Discharge Summary   Patient ID: [name] MRN: [id number] DOB/AGE: 15-Jun-1932 79 y.o.  Admit date: 02/24/2012 Discharge date: 02/27/2012  Primary Diagnosis:  Osteoarthritis Right hip  Admission Diagnoses:  Past Medical History  Diagnosis Date  . Diabetes mellitus without complication     takes lisinopril for kidneys  . Arthritis   . S/P knee replacement left 2011, right 2012   Discharge Diagnoses:   Active Problems:   * No active hospital problems. *  Estimated body mass index is 29.56 kg/(m^2) as calculated from the following:   Height as of this encounter: 5\' 10"  (1.778 m).   Weight as of this encounter: 93.441 kg (206 lb).  Classification of overweight in adults according to BMI (WHO, 1998)   Procedure: Procedure(s) (LRB): TOTAL HIP ARTHROPLASTY (Right)   Consults: None  HPI: [name] is a 79 y.o. male with end stage arthritis of his right hip with progressively worsening pain and dysfunction. Pain occurs with activity and rest including pain at night. He has tried analgesics, protected weight bearing and rest without benefit. Pain is too severe to attempt physical therapy. Radiographs demonstrate bone on bone arthritis with subchondral cyst formation. He presents now for right THA.  Laboratory Data: Admission on 02/24/2012  Component Date Value Range Status  . Sodium 02/24/2012 133* 135 - 145 mEq/L Final  . Potassium 02/24/2012 4.6  3.5 - 5.1 mEq/L Final  . Chloride 02/24/2012 99  96 - 112 mEq/L Final  . CO2 02/24/2012 26  19 - 32 mEq/L Final  . Glucose, Bld 02/24/2012 139* 70 - 99 mg/dL Final  . BUN 14/78/2956 26* 6 - 23 mg/dL Final  . Creatinine, Ser 02/24/2012 1.15  0.50 - 1.35 mg/dL Final  . Calcium 21/30/8657 9.3  8.4 - 10.5 mg/dL Final  . GFR calc non Af Amer 02/24/2012 60* >90 mL/min Final  . GFR calc Af Amer 02/24/2012 69* >90 mL/min Final   Comment:                                 The eGFR has been calculated          using the CKD EPI equation.                          This calculation has not been                          validated in all clinical                          situations.                          eGFR's persistently                          <90 mL/min signify                          possible Chronic Kidney Disease.  . Glucose-Capillary 02/24/2012 131* 70 - 99 mg/dL Final  . Glucose-Capillary 02/24/2012 124* 70 - 99 mg/dL Final  . Comment 1 84/69/6295 Documented in Chart   Final  . Comment 2 02/24/2012 Notify RN   Final  . WBC  02/25/2012 6.3  4.0 - 10.5 K/uL Final  . RBC 02/25/2012 3.13* 4.22 - 5.81 MIL/uL Final  . Hemoglobin 02/25/2012 10.0* 13.0 - 17.0 g/dL Final  . HCT 78/46/9629 29.3* 39.0 - 52.0 % Final  . MCV 02/25/2012 93.6  78.0 - 100.0 fL Final  . MCH 02/25/2012 31.9  26.0 - 34.0 pg Final  . MCHC 02/25/2012 34.1  30.0 - 36.0 g/dL Final  . RDW 52/84/1324 13.2  11.5 - 15.5 % Final  . Platelets 02/25/2012 188  150 - 400 K/uL Final  . Sodium 02/25/2012 132* 135 - 145 mEq/L Final  . Potassium 02/25/2012 4.9  3.5 - 5.1 mEq/L Final  . Chloride 02/25/2012 100  96 - 112 mEq/L Final  . CO2 02/25/2012 26  19 - 32 mEq/L Final  . Glucose, Bld 02/25/2012 208* 70 - 99 mg/dL Final  . BUN 40/10/2723 21  6 - 23 mg/dL Final  . Creatinine, Ser 02/25/2012 1.05  0.50 - 1.35 mg/dL Final  . Calcium 36/64/4034 8.6  8.4 - 10.5 mg/dL Final  . GFR calc non Af Amer 02/25/2012 66* >90 mL/min Final  . GFR calc Af Amer 02/25/2012 77* >90 mL/min Final   Comment:                                 The eGFR has been calculated                          using the CKD EPI equation.                          This calculation has not been                          validated in all clinical                          situations.                          eGFR's persistently                          <90 mL/min signify                          possible Chronic Kidney Disease.  . Glucose-Capillary 02/24/2012 167*  70 - 99 mg/dL Final  . Glucose-Capillary 02/24/2012 193* 70 - 99 mg/dL Final  . Glucose-Capillary 02/25/2012 182* 70 - 99 mg/dL Final  . Glucose-Capillary 02/25/2012 177* 70 - 99 mg/dL Final  . Glucose-Capillary 02/25/2012 279* 70 - 99 mg/dL Final  . Comment 1 74/25/9563 Documented in Chart   Final  . Comment 2 02/25/2012 Notify RN   Final  . WBC 02/26/2012 8.2  4.0 - 10.5 K/uL Final  . RBC 02/26/2012 2.84* 4.22 - 5.81 MIL/uL Final  . Hemoglobin 02/26/2012 9.1* 13.0 - 17.0 g/dL Final  . HCT 87/56/4332 26.8* 39.0 - 52.0 % Final  . MCV 02/26/2012 94.4  78.0 - 100.0 fL Final  . MCH 02/26/2012 32.0  26.0 - 34.0 pg Final  . MCHC 02/26/2012 34.0  30.0 - 36.0 g/dL Final  . RDW 95/18/8416 13.6  11.5 - 15.5 % Final  .  Platelets 02/26/2012 170  150 - 400 K/uL Final  . Sodium 02/26/2012 131* 135 - 145 mEq/L Final  . Potassium 02/26/2012 4.3  3.5 - 5.1 mEq/L Final  . Chloride 02/26/2012 102  96 - 112 mEq/L Final  . CO2 02/26/2012 24  19 - 32 mEq/L Final  . Glucose, Bld 02/26/2012 170* 70 - 99 mg/dL Final  . BUN 16/10/9602 28* 6 - 23 mg/dL Final  . Creatinine, Ser 02/26/2012 1.28  0.50 - 1.35 mg/dL Final  . Calcium 54/09/8117 8.3* 8.4 - 10.5 mg/dL Final  . GFR calc non Af Amer 02/26/2012 52* >90 mL/min Final  . GFR calc Af Amer 02/26/2012 61* >90 mL/min Final   Comment:                                 The eGFR has been calculated                          using the CKD EPI equation.                          This calculation has not been                          validated in all clinical                          situations.                          eGFR's persistently                          <90 mL/min signify                          possible Chronic Kidney Disease.  . Glucose-Capillary 02/25/2012 149* 70 - 99 mg/dL Final  . Comment 1 14/78/2956 Notify RN   Final  . Comment 2 02/25/2012 Documented in Chart   Final  . Glucose-Capillary 02/26/2012 132* 70 - 99 mg/dL Final  . Glucose-Capillary  02/26/2012 186* 70 - 99 mg/dL Final  . WBC 21/30/8657 6.4  4.0 - 10.5 K/uL Final  . RBC 02/27/2012 2.62* 4.22 - 5.81 MIL/uL Final  . Hemoglobin 02/27/2012 8.2* 13.0 - 17.0 g/dL Final  . HCT 84/69/6295 24.7* 39.0 - 52.0 % Final  . MCV 02/27/2012 94.3  78.0 - 100.0 fL Final  . MCH 02/27/2012 31.3  26.0 - 34.0 pg Final  . MCHC 02/27/2012 33.2  30.0 - 36.0 g/dL Final  . RDW 28/41/3244 13.4  11.5 - 15.5 % Final  . Platelets 02/27/2012 163  150 - 400 K/uL Final  . Glucose-Capillary 02/26/2012 223* 70 - 99 mg/dL Final  . Comment 1 01/12/7251 Documented in Chart   Final  . Comment 2 02/26/2012 Notify RN   Final  . Glucose-Capillary 02/26/2012 143* 70 - 99 mg/dL Final  . Comment 1 66/44/0347 Documented in Chart   Final  . Comment 2 02/26/2012 Notify RN   Final  . Glucose-Capillary 02/27/2012 164* 70 - 99 mg/dL Final  Hospital Outpatient Visit on 02/15/2012  Component Date Value Range Status  . MRSA, PCR 02/15/2012 NEGATIVE  NEGATIVE Final  .  Staphylococcus aureus 02/15/2012 NEGATIVE  NEGATIVE Final   Comment:                                 The Xpert SA Assay (FDA                          approved for NASAL specimens                          in patients over 20 years of age),                          is one component of                          a comprehensive surveillance                          program.  Test performance has                          been validated by [employer] for patients greater                          than or equal to 2 year old.                          It is not intended                          to diagnose infection nor to                          guide or monitor treatment.  Marland Kitchen aPTT 02/15/2012 33  24 - 37 seconds Final  . WBC 02/15/2012 5.5  4.0 - 10.5 K/uL Final  . RBC 02/15/2012 4.11* 4.22 - 5.81 MIL/uL Final  . Hemoglobin 02/15/2012 12.7* 13.0 - 17.0 g/dL Final  . HCT 21/30/8657 39.2  39.0 - 52.0 % Final  . MCV 02/15/2012 95.4   78.0 - 100.0 fL Final  . MCH 02/15/2012 30.9  26.0 - 34.0 pg Final  . MCHC 02/15/2012 32.4  30.0 - 36.0 g/dL Final  . RDW 84/69/6295 13.3  11.5 - 15.5 % Final  . Platelets 02/15/2012 231  150 - 400 K/uL Final  . Sodium 02/15/2012 133* 135 - 145 mEq/L Final  . Potassium 02/15/2012 6.3* 3.5 - 5.1 mEq/L Final   Comment: NO VISIBLE HEMOLYSIS                          CRITICAL RESULT CALLED TO, READ BACK BY AND VERIFIED WITH:                          SUITS,L. RN 1150 02/15/12 BARFIELD,T  . Chloride 02/15/2012 100  96 - 112 mEq/L Final  . CO2 02/15/2012 27  19 - 32 mEq/L Final  . Glucose, Bld 02/15/2012  127* 70 - 99 mg/dL Final  . BUN 62/13/0865 29* 6 - 23 mg/dL Final  . Creatinine, Ser 02/15/2012 1.19  0.50 - 1.35 mg/dL Final  . Calcium 78/46/9629 9.5  8.4 - 10.5 mg/dL Final  . Total Protein 02/15/2012 7.6  6.0 - 8.3 g/dL Final  . Albumin 52/84/1324 3.6  3.5 - 5.2 g/dL Final  . AST 40/10/2723 23  0 - 37 U/L Final  . ALT 02/15/2012 14  0 - 53 U/L Final  . Alkaline Phosphatase 02/15/2012 96  39 - 117 U/L Final  . Total Bilirubin 02/15/2012 0.3  0.3 - 1.2 mg/dL Final  . GFR calc non Af Amer 02/15/2012 57* >90 mL/min Final  . GFR calc Af Amer 02/15/2012 66* >90 mL/min Final   Comment:                                 The eGFR has been calculated                          using the CKD EPI equation.                          This calculation has not been                          validated in all clinical                          situations.                          eGFR's persistently                          <90 mL/min signify                          possible Chronic Kidney Disease.  Marland Kitchen Prothrombin Time 02/15/2012 12.9  11.6 - 15.2 seconds Final  . INR 02/15/2012 0.98  0.00 - 1.49 Final  . ABO/RH(D) 02/15/2012 A NEG   Final  . Antibody Screen 02/15/2012 NEG   Final  . Sample Expiration 02/15/2012 02/29/2012   Final  . Color, Urine 02/15/2012 YELLOW  YELLOW Final  . APPearance 02/15/2012  CLEAR  CLEAR Final  . Specific Gravity, Urine 02/15/2012 1.025  1.005 - 1.030 Final  . pH 02/15/2012 6.5  5.0 - 8.0 Final  . Glucose, UA 02/15/2012 NEGATIVE  NEGATIVE mg/dL Final  . Hgb urine dipstick 02/15/2012 NEGATIVE  NEGATIVE Final  . Bilirubin Urine 02/15/2012 NEGATIVE  NEGATIVE Final  . Ketones, ur 02/15/2012 NEGATIVE  NEGATIVE mg/dL Final  . Protein, ur 36/64/4034 NEGATIVE  NEGATIVE mg/dL Final  . Urobilinogen, UA 02/15/2012 0.2  0.0 - 1.0 mg/dL Final  . Nitrite 74/25/9563 NEGATIVE  NEGATIVE Final  . Leukocytes, UA 02/15/2012 NEGATIVE  NEGATIVE Final   MICROSCOPIC NOT DONE ON URINES WITH NEGATIVE PROTEIN, BLOOD, LEUKOCYTES, NITRITE, OR GLUCOSE <1000 mg/dL.     X-Rays:Dg Hip Complete Right  02/15/2012  *RADIOLOGY REPORT*  Clinical Data: Preoperative assessment for right total hip replacement  RIGHT HIP - COMPLETE 2+ VIEW  Comparison: 02/02/2011  Findings: Components of left hip prosthesis are newly identified. Osseous mineralization appears  grossly stable. Severe osteoarthritic changes of the right hip joint are identified with progressive joint space narrowing and subchondral sclerosis. Destruction and flattening of the right femoral head versus previous exam question secondary to avascular necrosis. No acute fracture or dislocation. Multilevel degenerative disc disease changes lower lumbar spine.  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 Changes are markedly progressive since 02/02/2011.   Original Report Authenticated By: Ulyses Southward, M.D.    Dg Pelvis Portable  02/24/2012  *RADIOLOGY REPORT*  Clinical Data: Right hip replacement.  PORTABLE PELVIS  Comparison: Single view of the pelvis 11/02/2011.  Findings: The patient has a new right total hip arthroplasty.  The device is located.  No fracture is identified.  Tip of the femoral stem is not included on examination.  Gas in  the soft tissues and surgical drain are noted.  Left hip replacement is again identified.  IMPRESSION: Right total hip replacement without complicating feature.   Original Report Authenticated By: Holley Dexter, M.D.    Dg Hip Portable 1 View Right  02/24/2012  *RADIOLOGY REPORT*  Clinical Data: Status post hip replacement.  PORTABLE RIGHT HIP - 1 VIEW  Comparison: Plain films 02/15/2012.  Findings: New right total hip arthroplasty is in place.  The device is located and there is no fracture.  Gas in the soft tissues and surgical drain are noted.  IMPRESSION: Right total hip replacement without complicating feature.   Original Report Authenticated By: Holley Dexter, M.D.     EKG: Orders placed during the hospital encounter of 11/02/11  . EKG     Hospital Course: Patient was admitted to [facility] and taken to the OR and underwent the above state procedure without complications.  Patient tolerated the procedure well and was later transferred to the recovery room and then to the orthopaedic floor for postoperative care.  They were given PO and IV analgesics for pain control following their surgery.  They were given 24 hours of postoperative antibiotics of  Anti-infectives   Start     Dose/Rate Route Frequency Ordered Stop   02/24/12 1800  ceFAZolin (ANCEF) IVPB 1 g/50 mL premix     1 g 100 mL/hr over 30 Minutes Intravenous Every 6 hours 02/24/12 1531 02/25/12 0010   02/24/12 0830  ceFAZolin (ANCEF) IVPB 2 g/50 mL premix     2 g 100 mL/hr over 30 Minutes Intravenous On call to O.R. 02/24/12 1610 02/24/12 1101     and started on DVT prophylaxis in the form of Xarelto.   PT and OT were ordered for total hip protocol.  The patient was allowed to be WBAT with therapy. Discharge planning was consulted to help with postop disposition and equipment needs.  Patient had a decent night on the evening of surgery and started to get up OOB with therapy on day one.  Hemovac drain was pulled  without difficulty.  The knee immobilizer was removed and discontinued. He had some nausea that evening. Continued to work with therapy into day two but was a little behind due to the nausea.  Dressing was changed on day two and the incision was healing well.  By day three, the patient had progressed with therapy and meeting their goals.  Incision was healing well.  Patient was seen in rounds and was ready to go home.   Discharge Medications: Prior to Admission medications   Medication Sig Start Date End  Date Taking? Authorizing Provider  insulin NPH-insulin regular (NOVOLIN 70/30) (70-30) 100 UNIT/ML injection Inject 15 Units into the skin 2 (two) times daily with a meal.   Yes Historical Provider, MD  docusate (COLACE) 60 MG/15ML syrup Take 60 mg by mouth daily as needed. For constipation    Historical Provider, MD  ferrous sulfate 325 (65 FE) MG tablet Take 325 mg by mouth daily with breakfast.    Historical Provider, MD  lisinopril (PRINIVIL,ZESTRIL) 2.5 MG tablet Take 2.5 mg by mouth every morning.    Historical Provider, MD  methocarbamol (ROBAXIN) 500 MG tablet Take 1 tablet (500 mg total) by mouth every 6 (six) hours as needed. 02/25/12   Loanne Drilling, MD  oxyCODONE (OXY IR/ROXICODONE) 5 MG immediate release tablet Take 1-2 tablets (5-10 mg total) by mouth every 3 (three) hours as needed. 02/25/12   Loanne Drilling, MD  rivaroxaban (XARELTO) 10 MG TABS tablet Take 1 tablet (10 mg total) by mouth daily with breakfast. 02/25/12   Loanne Drilling, MD  traMADol (ULTRAM) 50 MG tablet Take 1-2 tablets (50-100 mg total) by mouth every 6 (six) hours as needed. 02/25/12   Loanne Drilling, MD    Diet: Diabetic diet Activity:WBAT No bending hip over 90 degrees- A "L" Angle Do not cross legs Do not let foot roll inward When turning these patients a pillow should be placed between the patient's legs to prevent crossing. Patients should have the affected knee fully extended when trying to sit or stand  from all surfaces to prevent excessive hip flexion. When ambulating and turning toward the affected side the affected leg should have the toes turned out prior to moving the walker and the rest of patient's body as to prevent internal rotation/ turning in of the leg. Abduction pillows are the most effective way to prevent a patient from not crossing legs or turning toes in at rest. If an abduction pillow is not ordered placing a regular pillow length wise between the patient's legs is also an effective reminder. It is imperative that these precautions be maintained so that the surgical hip does not dislocate. Follow-up:in 2 weeks Disposition - Home Discharged Condition: good    Future Appointments Provider Department Dept Phone   03/19/2012 9:30 AM Sherrie George, MD [facility] [phone]       Medication List    STOP taking these medications       aspirin 81 MG chewable tablet     ibuprofen 200 MG tablet  Commonly known as:  ADVIL,MOTRIN      TAKE these medications       docusate 60 MG/15ML syrup  Commonly known as:  COLACE  Take 60 mg by mouth daily as needed. For constipation     ferrous sulfate 325 (65 FE) MG tablet  Take 325 mg by mouth daily with breakfast.     insulin NPH-insulin regular (70-30) 100 UNIT/ML injection  Commonly known as:  NOVOLIN 70/30  Inject 15 Units into the skin 2 (two) times daily with a meal.     lisinopril 2.5 MG tablet  Commonly known as:  PRINIVIL,ZESTRIL  Take 2.5 mg by mouth every morning.     methocarbamol 500 MG tablet  Commonly known as:  ROBAXIN  Take 1 tablet (500 mg total) by mouth every 6 (six) hours as needed.     oxyCODONE 5 MG immediate release tablet  Commonly known as:  Oxy IR/ROXICODONE  Take 1-2 tablets (  5-10 mg total) by mouth every 3 (three) hours as needed.     rivaroxaban 10 MG Tabs tablet  Commonly known as:  XARELTO  Take 1 tablet (10 mg total) by mouth daily with breakfast.      traMADol 50 MG tablet  Commonly known as:  ULTRAM  Take 1-2 tablets (50-100 mg total) by mouth every 6 (six) hours as needed.           Follow-up Information   Follow up with Loanne Drilling, MD. Schedule an appointment as soon as possible for a visit on 03/08/2012. (Call [phone] Monday to make the appointment)    Contact information:   [address], [address] [place] [address] [phone]       Signed: Patrica Duel 02/27/2012, 7:17 AM

## 2012-02-27 NOTE — Care Management Note (Signed)
    Page 1 of 1   02/27/2012     6:33:06 PM   CARE MANAGEMENT NOTE 02/27/2012  Patient:  [name], [name]   Account Number:  [id number]  Date Initiated:  02/27/2012  Documentation initiated by:  Colleen Can  Subjective/Objective Assessment:   dx rt hip arthroplasty    Pt is already active with [facility] services in place     Action/Plan:   Home with [facility] services; services will start tomorrow 02/28/2012   Anticipated DC Date:  02/27/2012   Anticipated DC Plan:  HOME W HOME HEALTH SERVICES  In-house referral  NA      DC Planning Services  CM consult      [facility] Choice  Resumption Of Svcs/PTA Provider   Choice offered to / List presented to:          [facility] arranged  HH-2 PT  HH-3 OT      [facility] agency  [facility]   Status of service:  Completed, signed off Medicare Important Message given?  NA - LOS <3 / Initial given by admissions (If response is "NO", the following Medicare IM given date fields will be blank) Date Medicare IM given:   Date Additional Medicare IM given:    Discharge Disposition:  HOME W HOME HEALTH SERVICES  Per UR Regulation:    If discussed at Long Length of Stay Meetings, dates discussed:    Comments:

## 2012-02-27 NOTE — Clinical Documentation Improvement (Signed)
     Anemia Blood Loss Clarification  THIS DOCUMENT IS NOT A PERMANENT PART OF THE MEDICAL RECORD  RESPOND TO THE THIS QUERY, FOLLOW THE INSTRUCTIONS BELOW:  1. If needed, update documentation for the patient's encounter via the notes activity.  2. Access this query again and click edit on the In [employer].  3. After updating, or not, click F2 to complete all highlighted (required) fields concerning your review. Select "additional documentation in the medical record" OR "no additional documentation provided".  4. Click Sign note button.  5. The deficiency will fall out of your In Basket *Please let us know if you are not able to complete this workflow by phone or e-mail (listed below).        02/27/12  Dear Dr.  Patrica Duel Marton Redwood  In an effort to better capture your patient's severity of illness, reflect appropriate length of stay and utilization of resources, a review of the patient medical record has revealed the following indicators.    Based on your clinical judgment, please clarify and document in a progress note and/or discharge summary the clinical condition associated with the following supporting information:  In responding to this query please exercise your independent judgment.  The fact that a query is asked, does not imply that any particular answer is desired or expected.  In this patient admitted for R THA, Post OP H/H-8.2/24.7.   Clarification Needed    Please clarify the clinical significance of the post op abnormal H/H (if known) and document in the pn and d/c summary.  Thank you for all that you do for our patients!    Possible Clinical Conditions?   " Expected Acute Blood Loss Anemia  " Acute Blood Loss Anemia  " Acute on chronic blood loss anemia   " Other Condition________________  " Cannot Clinically Determine   Risk Factors: TOTAL HIP ARTHROPLASTY (Right), mild hyponatremia/OA,   Supporting Information:  Signs and Symptoms   Abn H/H  Diagnostics: Component     Latest Ref Rng 02/25/2012         4:33 AM  Hemoglobin     13.0 - 17.0 g/dL 57.8 (L)  HCT     46.9 - 52.0 % 29.3 (L)   Component     Latest Ref Rng 02/26/2012         4:23 AM  Hemoglobin     13.0 - 17.0 g/dL 9.1 (L)  HCT     62.9 - 52.0 % 26.8 (L)   Component     Latest Ref Rng 02/27/2012          Hemoglobin     13.0 - 17.0 g/dL 8.2 (L)  HCT     52.8 - 52.0 % 24.7 (L)   Treatments: NS IVF  Reviewed:  no additional documentation provided ljh  Thank You,  Enis Slipper  RN, BSN, MSN/Inf, CCDS Clinical Documentation Specialist [facility] Dept Pager: [phone] / E-mail: [email or web address] [employer] Information [facility] [place]

## 2012-02-27 NOTE — Progress Notes (Signed)
   Subjective: 3 Days Post-Op Procedure(s) (LRB): TOTAL HIP ARTHROPLASTY (Right) Patient reports pain as mild.   Patient seen in rounds with Dr. Lequita Halt. Patient is well, and has had no acute complaints or problems Patient is ready to go home today  Objective: Vital signs in last 24 hours: Temp:  [98.2 F (36.8 C)-98.5 F (36.9 C)] 98.2 F (36.8 C) (02/17 0443) Pulse Rate:  [68-71] 68 (02/17 0443) Resp:  [17-20] 20 (02/17 0443) BP: (130-132)/(56-61) 130/61 mmHg (02/17 0443) SpO2:  [96 %-97 %] 96 % (02/17 0443)  Intake/Output from previous day:  Intake/Output Summary (Last 24 hours) at 02/27/12 0646 Last data filed at 02/27/12 0443  Gross per 24 hour  Intake   1080 ml  Output   1275 ml  Net   -195 ml    Intake/Output this shift: Total I/O In: 240 [P.O.:240] Out: 425 [Urine:425]  Labs:  Recent Labs  02/25/12 0433 02/26/12 0423 02/27/12 0406  HGB 10.0* 9.1* 8.2*    Recent Labs  02/26/12 0423 02/27/12 0406  WBC 8.2 6.4  RBC 2.84* 2.62*  HCT 26.8* 24.7*  PLT 170 163    Recent Labs  02/25/12 0433 02/26/12 0423  NA 132* 131*  K 4.9 4.3  CL 100 102  CO2 26 24  BUN 21 28*  CREATININE 1.05 1.28  GLUCOSE 208* 170*  CALCIUM 8.6 8.3*   No results found for this basename: LABPT, INR,  in the last 72 hours  EXAM: General - Patient is Alert, Appropriate and Oriented Extremity - Neurovascular intact Sensation intact distally Dorsiflexion/Plantar flexion intact No cellulitis present Incision - clean, dry, no drainage, healing Motor Function - intact, moving foot and toes well on exam.   Assessment/Plan: 3 Days Post-Op Procedure(s) (LRB): TOTAL HIP ARTHROPLASTY (Right) Procedure(s) (LRB): TOTAL HIP ARTHROPLASTY (Right) Past Medical History  Diagnosis Date  . Diabetes mellitus without complication     takes lisinopril for kidneys  . Arthritis   . S/P knee replacement left 2011, right 2012   Active Problems:   * No active hospital problems.  *  Estimated body mass index is 29.56 kg/(m^2) as calculated from the following:   Height as of this encounter: 5\' 10"  (1.778 m).   Weight as of this encounter: 93.441 kg (206 lb). Up with therapy Discharge home with home health Diet - Diabetic diet Follow up - in 2 weeks Activity - WBAT Disposition - Home Condition Upon Discharge - Good D/C Meds - See DC Summary DVT Prophylaxis - Xarelto  Kia Varnadore 02/27/2012, 6:46 AM

## 2012-02-28 ENCOUNTER — Encounter (HOSPITAL_COMMUNITY): Payer: Self-pay | Admitting: Orthopedic Surgery

## 2012-02-28 DIAGNOSIS — Z96649 Presence of unspecified artificial hip joint: Secondary | ICD-10-CM | POA: Diagnosis not present

## 2012-02-28 DIAGNOSIS — E119 Type 2 diabetes mellitus without complications: Secondary | ICD-10-CM | POA: Diagnosis not present

## 2012-02-28 DIAGNOSIS — S0100XA Unspecified open wound of scalp, initial encounter: Secondary | ICD-10-CM | POA: Diagnosis not present

## 2012-02-28 DIAGNOSIS — Z471 Aftercare following joint replacement surgery: Secondary | ICD-10-CM | POA: Diagnosis not present

## 2012-03-02 DIAGNOSIS — Z471 Aftercare following joint replacement surgery: Secondary | ICD-10-CM | POA: Diagnosis not present

## 2012-03-03 DIAGNOSIS — E119 Type 2 diabetes mellitus without complications: Secondary | ICD-10-CM | POA: Diagnosis not present

## 2012-03-05 DIAGNOSIS — S0100XA Unspecified open wound of scalp, initial encounter: Secondary | ICD-10-CM | POA: Diagnosis not present

## 2012-03-06 DIAGNOSIS — Z471 Aftercare following joint replacement surgery: Secondary | ICD-10-CM | POA: Diagnosis not present

## 2012-03-09 DIAGNOSIS — Z96649 Presence of unspecified artificial hip joint: Secondary | ICD-10-CM | POA: Diagnosis not present

## 2012-03-13 DIAGNOSIS — S0100XA Unspecified open wound of scalp, initial encounter: Secondary | ICD-10-CM | POA: Diagnosis not present

## 2012-03-13 DIAGNOSIS — Z96649 Presence of unspecified artificial hip joint: Secondary | ICD-10-CM | POA: Diagnosis not present

## 2012-03-19 ENCOUNTER — Ambulatory Visit (INDEPENDENT_AMBULATORY_CARE_PROVIDER_SITE_OTHER): Payer: Medicare Other | Admitting: Ophthalmology

## 2012-03-23 DIAGNOSIS — S0100XA Unspecified open wound of scalp, initial encounter: Secondary | ICD-10-CM | POA: Diagnosis not present

## 2012-03-30 ENCOUNTER — Other Ambulatory Visit (HOSPITAL_COMMUNITY): Payer: Self-pay | Admitting: Family Medicine

## 2012-03-31 ENCOUNTER — Other Ambulatory Visit (HOSPITAL_COMMUNITY): Payer: Self-pay | Admitting: Family Medicine

## 2012-04-04 DIAGNOSIS — E119 Type 2 diabetes mellitus without complications: Secondary | ICD-10-CM | POA: Diagnosis not present

## 2012-04-04 DIAGNOSIS — S43499A Other sprain of unspecified shoulder joint, initial encounter: Secondary | ICD-10-CM | POA: Diagnosis not present

## 2012-04-04 DIAGNOSIS — Q828 Other specified congenital malformations of skin: Secondary | ICD-10-CM | POA: Diagnosis not present

## 2012-04-04 DIAGNOSIS — M25559 Pain in unspecified hip: Secondary | ICD-10-CM | POA: Diagnosis not present

## 2012-04-12 DIAGNOSIS — Z4789 Encounter for other orthopedic aftercare: Secondary | ICD-10-CM | POA: Diagnosis not present

## 2012-05-14 ENCOUNTER — Other Ambulatory Visit (HOSPITAL_COMMUNITY): Payer: Self-pay | Admitting: Family Medicine

## 2012-05-17 DIAGNOSIS — M25519 Pain in unspecified shoulder: Secondary | ICD-10-CM | POA: Diagnosis not present

## 2012-05-18 DIAGNOSIS — E1159 Type 2 diabetes mellitus with other circulatory complications: Secondary | ICD-10-CM | POA: Diagnosis not present

## 2012-05-18 DIAGNOSIS — E119 Type 2 diabetes mellitus without complications: Secondary | ICD-10-CM | POA: Diagnosis not present

## 2012-05-22 DIAGNOSIS — L97929 Non-pressure chronic ulcer of unspecified part of left lower leg with unspecified severity: Secondary | ICD-10-CM | POA: Diagnosis not present

## 2012-05-22 DIAGNOSIS — I83219 Varicose veins of right lower extremity with both ulcer of unspecified site and inflammation: Secondary | ICD-10-CM | POA: Diagnosis not present

## 2012-05-22 DIAGNOSIS — E119 Type 2 diabetes mellitus without complications: Secondary | ICD-10-CM | POA: Diagnosis not present

## 2012-05-22 DIAGNOSIS — I872 Venous insufficiency (chronic) (peripheral): Secondary | ICD-10-CM | POA: Diagnosis not present

## 2012-05-22 DIAGNOSIS — L98499 Non-pressure chronic ulcer of skin of other sites with unspecified severity: Secondary | ICD-10-CM | POA: Diagnosis not present

## 2012-05-22 DIAGNOSIS — Z7982 Long term (current) use of aspirin: Secondary | ICD-10-CM | POA: Diagnosis not present

## 2012-05-22 DIAGNOSIS — C4441 Basal cell carcinoma of skin of scalp and neck: Secondary | ICD-10-CM | POA: Diagnosis not present

## 2012-05-22 DIAGNOSIS — L97909 Non-pressure chronic ulcer of unspecified part of unspecified lower leg with unspecified severity: Secondary | ICD-10-CM | POA: Diagnosis not present

## 2012-05-22 DIAGNOSIS — Z79899 Other long term (current) drug therapy: Secondary | ICD-10-CM | POA: Diagnosis not present

## 2012-05-22 DIAGNOSIS — Z833 Family history of diabetes mellitus: Secondary | ICD-10-CM | POA: Diagnosis not present

## 2012-05-22 DIAGNOSIS — I10 Essential (primary) hypertension: Secondary | ICD-10-CM | POA: Diagnosis not present

## 2012-05-23 DIAGNOSIS — E1169 Type 2 diabetes mellitus with other specified complication: Secondary | ICD-10-CM | POA: Diagnosis not present

## 2012-05-23 DIAGNOSIS — I10 Essential (primary) hypertension: Secondary | ICD-10-CM | POA: Diagnosis not present

## 2012-05-23 DIAGNOSIS — E119 Type 2 diabetes mellitus without complications: Secondary | ICD-10-CM | POA: Diagnosis not present

## 2012-05-23 DIAGNOSIS — L97809 Non-pressure chronic ulcer of other part of unspecified lower leg with unspecified severity: Secondary | ICD-10-CM | POA: Diagnosis not present

## 2012-05-23 DIAGNOSIS — Z87891 Personal history of nicotine dependence: Secondary | ICD-10-CM | POA: Diagnosis not present

## 2012-05-29 DIAGNOSIS — L97909 Non-pressure chronic ulcer of unspecified part of unspecified lower leg with unspecified severity: Secondary | ICD-10-CM | POA: Diagnosis not present

## 2012-05-29 DIAGNOSIS — E119 Type 2 diabetes mellitus without complications: Secondary | ICD-10-CM | POA: Diagnosis not present

## 2012-05-29 DIAGNOSIS — C4441 Basal cell carcinoma of skin of scalp and neck: Secondary | ICD-10-CM | POA: Diagnosis not present

## 2012-05-29 DIAGNOSIS — I10 Essential (primary) hypertension: Secondary | ICD-10-CM | POA: Diagnosis not present

## 2012-05-29 DIAGNOSIS — Z79899 Other long term (current) drug therapy: Secondary | ICD-10-CM | POA: Diagnosis not present

## 2012-05-29 DIAGNOSIS — I872 Venous insufficiency (chronic) (peripheral): Secondary | ICD-10-CM | POA: Diagnosis not present

## 2012-06-05 DIAGNOSIS — L97909 Non-pressure chronic ulcer of unspecified part of unspecified lower leg with unspecified severity: Secondary | ICD-10-CM | POA: Diagnosis not present

## 2012-06-05 DIAGNOSIS — C4441 Basal cell carcinoma of skin of scalp and neck: Secondary | ICD-10-CM | POA: Diagnosis not present

## 2012-06-05 DIAGNOSIS — Z79899 Other long term (current) drug therapy: Secondary | ICD-10-CM | POA: Diagnosis not present

## 2012-06-05 DIAGNOSIS — I872 Venous insufficiency (chronic) (peripheral): Secondary | ICD-10-CM | POA: Diagnosis not present

## 2012-06-05 DIAGNOSIS — E119 Type 2 diabetes mellitus without complications: Secondary | ICD-10-CM | POA: Diagnosis not present

## 2012-06-05 DIAGNOSIS — I10 Essential (primary) hypertension: Secondary | ICD-10-CM | POA: Diagnosis not present

## 2012-06-08 DIAGNOSIS — L97909 Non-pressure chronic ulcer of unspecified part of unspecified lower leg with unspecified severity: Secondary | ICD-10-CM | POA: Diagnosis not present

## 2012-06-08 DIAGNOSIS — C4441 Basal cell carcinoma of skin of scalp and neck: Secondary | ICD-10-CM | POA: Diagnosis not present

## 2012-06-08 DIAGNOSIS — E119 Type 2 diabetes mellitus without complications: Secondary | ICD-10-CM | POA: Diagnosis not present

## 2012-06-08 DIAGNOSIS — I872 Venous insufficiency (chronic) (peripheral): Secondary | ICD-10-CM | POA: Diagnosis not present

## 2012-06-08 DIAGNOSIS — I10 Essential (primary) hypertension: Secondary | ICD-10-CM | POA: Diagnosis not present

## 2012-06-08 DIAGNOSIS — Z79899 Other long term (current) drug therapy: Secondary | ICD-10-CM | POA: Diagnosis not present

## 2012-06-11 DIAGNOSIS — M47817 Spondylosis without myelopathy or radiculopathy, lumbosacral region: Secondary | ICD-10-CM | POA: Diagnosis not present

## 2012-06-20 DIAGNOSIS — C4441 Basal cell carcinoma of skin of scalp and neck: Secondary | ICD-10-CM | POA: Diagnosis not present

## 2012-06-20 DIAGNOSIS — Z79899 Other long term (current) drug therapy: Secondary | ICD-10-CM | POA: Diagnosis not present

## 2012-06-20 DIAGNOSIS — E119 Type 2 diabetes mellitus without complications: Secondary | ICD-10-CM | POA: Diagnosis not present

## 2012-06-20 DIAGNOSIS — Z791 Long term (current) use of non-steroidal anti-inflammatories (NSAID): Secondary | ICD-10-CM | POA: Diagnosis not present

## 2012-06-21 DIAGNOSIS — Z791 Long term (current) use of non-steroidal anti-inflammatories (NSAID): Secondary | ICD-10-CM | POA: Diagnosis not present

## 2012-06-21 DIAGNOSIS — Z79899 Other long term (current) drug therapy: Secondary | ICD-10-CM | POA: Diagnosis not present

## 2012-06-21 DIAGNOSIS — E119 Type 2 diabetes mellitus without complications: Secondary | ICD-10-CM | POA: Diagnosis not present

## 2012-06-21 DIAGNOSIS — C4441 Basal cell carcinoma of skin of scalp and neck: Secondary | ICD-10-CM | POA: Diagnosis not present

## 2012-06-28 DIAGNOSIS — Q828 Other specified congenital malformations of skin: Secondary | ICD-10-CM | POA: Diagnosis not present

## 2012-06-29 DIAGNOSIS — E1149 Type 2 diabetes mellitus with other diabetic neurological complication: Secondary | ICD-10-CM | POA: Diagnosis not present

## 2012-06-29 DIAGNOSIS — E1169 Type 2 diabetes mellitus with other specified complication: Secondary | ICD-10-CM | POA: Diagnosis not present

## 2012-06-29 DIAGNOSIS — C4441 Basal cell carcinoma of skin of scalp and neck: Secondary | ICD-10-CM | POA: Diagnosis not present

## 2012-06-29 DIAGNOSIS — L97809 Non-pressure chronic ulcer of other part of unspecified lower leg with unspecified severity: Secondary | ICD-10-CM | POA: Diagnosis not present

## 2012-06-29 DIAGNOSIS — L97909 Non-pressure chronic ulcer of unspecified part of unspecified lower leg with unspecified severity: Secondary | ICD-10-CM | POA: Diagnosis not present

## 2012-07-06 DIAGNOSIS — L97809 Non-pressure chronic ulcer of other part of unspecified lower leg with unspecified severity: Secondary | ICD-10-CM | POA: Diagnosis not present

## 2012-07-06 DIAGNOSIS — E1169 Type 2 diabetes mellitus with other specified complication: Secondary | ICD-10-CM | POA: Diagnosis not present

## 2012-07-06 DIAGNOSIS — L97909 Non-pressure chronic ulcer of unspecified part of unspecified lower leg with unspecified severity: Secondary | ICD-10-CM | POA: Diagnosis not present

## 2012-07-06 DIAGNOSIS — C4441 Basal cell carcinoma of skin of scalp and neck: Secondary | ICD-10-CM | POA: Diagnosis not present

## 2012-07-06 DIAGNOSIS — E1149 Type 2 diabetes mellitus with other diabetic neurological complication: Secondary | ICD-10-CM | POA: Diagnosis not present

## 2012-07-20 DIAGNOSIS — L97809 Non-pressure chronic ulcer of other part of unspecified lower leg with unspecified severity: Secondary | ICD-10-CM | POA: Diagnosis not present

## 2012-07-20 DIAGNOSIS — T8131XA Disruption of external operation (surgical) wound, not elsewhere classified, initial encounter: Secondary | ICD-10-CM | POA: Diagnosis not present

## 2012-07-20 DIAGNOSIS — E1169 Type 2 diabetes mellitus with other specified complication: Secondary | ICD-10-CM | POA: Diagnosis not present

## 2012-07-20 DIAGNOSIS — E1149 Type 2 diabetes mellitus with other diabetic neurological complication: Secondary | ICD-10-CM | POA: Diagnosis not present

## 2012-07-20 DIAGNOSIS — L97909 Non-pressure chronic ulcer of unspecified part of unspecified lower leg with unspecified severity: Secondary | ICD-10-CM | POA: Diagnosis not present

## 2012-07-27 DIAGNOSIS — E1149 Type 2 diabetes mellitus with other diabetic neurological complication: Secondary | ICD-10-CM | POA: Diagnosis not present

## 2012-07-27 DIAGNOSIS — E1169 Type 2 diabetes mellitus with other specified complication: Secondary | ICD-10-CM | POA: Diagnosis not present

## 2012-07-27 DIAGNOSIS — E119 Type 2 diabetes mellitus without complications: Secondary | ICD-10-CM | POA: Diagnosis not present

## 2012-07-27 DIAGNOSIS — T8131XA Disruption of external operation (surgical) wound, not elsewhere classified, initial encounter: Secondary | ICD-10-CM | POA: Diagnosis not present

## 2012-07-27 DIAGNOSIS — Z09 Encounter for follow-up examination after completed treatment for conditions other than malignant neoplasm: Secondary | ICD-10-CM | POA: Diagnosis not present

## 2012-07-27 DIAGNOSIS — L97809 Non-pressure chronic ulcer of other part of unspecified lower leg with unspecified severity: Secondary | ICD-10-CM | POA: Diagnosis not present

## 2012-07-27 DIAGNOSIS — L97909 Non-pressure chronic ulcer of unspecified part of unspecified lower leg with unspecified severity: Secondary | ICD-10-CM | POA: Diagnosis not present

## 2012-07-27 DIAGNOSIS — E1159 Type 2 diabetes mellitus with other circulatory complications: Secondary | ICD-10-CM | POA: Diagnosis not present

## 2012-08-03 DIAGNOSIS — E1169 Type 2 diabetes mellitus with other specified complication: Secondary | ICD-10-CM | POA: Diagnosis not present

## 2012-08-03 DIAGNOSIS — T8131XA Disruption of external operation (surgical) wound, not elsewhere classified, initial encounter: Secondary | ICD-10-CM | POA: Diagnosis not present

## 2012-08-03 DIAGNOSIS — L97909 Non-pressure chronic ulcer of unspecified part of unspecified lower leg with unspecified severity: Secondary | ICD-10-CM | POA: Diagnosis not present

## 2012-08-03 DIAGNOSIS — C4441 Basal cell carcinoma of skin of scalp and neck: Secondary | ICD-10-CM | POA: Diagnosis not present

## 2012-08-03 DIAGNOSIS — Z09 Encounter for follow-up examination after completed treatment for conditions other than malignant neoplasm: Secondary | ICD-10-CM | POA: Diagnosis not present

## 2012-08-03 DIAGNOSIS — L97809 Non-pressure chronic ulcer of other part of unspecified lower leg with unspecified severity: Secondary | ICD-10-CM | POA: Diagnosis not present

## 2012-08-09 DIAGNOSIS — M169 Osteoarthritis of hip, unspecified: Secondary | ICD-10-CM | POA: Diagnosis not present

## 2012-08-17 DIAGNOSIS — E1149 Type 2 diabetes mellitus with other diabetic neurological complication: Secondary | ICD-10-CM | POA: Diagnosis not present

## 2012-08-17 DIAGNOSIS — T8131XA Disruption of external operation (surgical) wound, not elsewhere classified, initial encounter: Secondary | ICD-10-CM | POA: Diagnosis not present

## 2012-08-17 DIAGNOSIS — Z85828 Personal history of other malignant neoplasm of skin: Secondary | ICD-10-CM | POA: Diagnosis not present

## 2012-08-17 DIAGNOSIS — L97809 Non-pressure chronic ulcer of other part of unspecified lower leg with unspecified severity: Secondary | ICD-10-CM | POA: Diagnosis not present

## 2012-08-17 DIAGNOSIS — C4441 Basal cell carcinoma of skin of scalp and neck: Secondary | ICD-10-CM | POA: Diagnosis not present

## 2012-08-17 DIAGNOSIS — L97909 Non-pressure chronic ulcer of unspecified part of unspecified lower leg with unspecified severity: Secondary | ICD-10-CM | POA: Diagnosis not present

## 2012-08-17 DIAGNOSIS — E1169 Type 2 diabetes mellitus with other specified complication: Secondary | ICD-10-CM | POA: Diagnosis not present

## 2012-08-31 DIAGNOSIS — T8131XA Disruption of external operation (surgical) wound, not elsewhere classified, initial encounter: Secondary | ICD-10-CM | POA: Diagnosis not present

## 2012-08-31 DIAGNOSIS — L97909 Non-pressure chronic ulcer of unspecified part of unspecified lower leg with unspecified severity: Secondary | ICD-10-CM | POA: Diagnosis not present

## 2012-08-31 DIAGNOSIS — E1169 Type 2 diabetes mellitus with other specified complication: Secondary | ICD-10-CM | POA: Diagnosis not present

## 2012-08-31 DIAGNOSIS — E1149 Type 2 diabetes mellitus with other diabetic neurological complication: Secondary | ICD-10-CM | POA: Diagnosis not present

## 2012-08-31 DIAGNOSIS — C4441 Basal cell carcinoma of skin of scalp and neck: Secondary | ICD-10-CM | POA: Diagnosis not present

## 2012-08-31 DIAGNOSIS — Z85828 Personal history of other malignant neoplasm of skin: Secondary | ICD-10-CM | POA: Diagnosis not present

## 2012-08-31 DIAGNOSIS — L97809 Non-pressure chronic ulcer of other part of unspecified lower leg with unspecified severity: Secondary | ICD-10-CM | POA: Diagnosis not present

## 2012-09-14 DIAGNOSIS — E1149 Type 2 diabetes mellitus with other diabetic neurological complication: Secondary | ICD-10-CM | POA: Diagnosis not present

## 2012-09-14 DIAGNOSIS — S81009A Unspecified open wound, unspecified knee, initial encounter: Secondary | ICD-10-CM | POA: Diagnosis not present

## 2012-09-14 DIAGNOSIS — T8131XA Disruption of external operation (surgical) wound, not elsewhere classified, initial encounter: Secondary | ICD-10-CM | POA: Diagnosis not present

## 2012-09-14 DIAGNOSIS — L97909 Non-pressure chronic ulcer of unspecified part of unspecified lower leg with unspecified severity: Secondary | ICD-10-CM | POA: Diagnosis not present

## 2012-09-14 DIAGNOSIS — E1169 Type 2 diabetes mellitus with other specified complication: Secondary | ICD-10-CM | POA: Diagnosis not present

## 2012-09-20 ENCOUNTER — Ambulatory Visit (INDEPENDENT_AMBULATORY_CARE_PROVIDER_SITE_OTHER): Payer: Medicare Other | Admitting: Ophthalmology

## 2012-09-20 DIAGNOSIS — E1139 Type 2 diabetes mellitus with other diabetic ophthalmic complication: Secondary | ICD-10-CM | POA: Diagnosis not present

## 2012-09-20 DIAGNOSIS — H43819 Vitreous degeneration, unspecified eye: Secondary | ICD-10-CM | POA: Diagnosis not present

## 2012-09-20 DIAGNOSIS — E11359 Type 2 diabetes mellitus with proliferative diabetic retinopathy without macular edema: Secondary | ICD-10-CM

## 2012-09-20 DIAGNOSIS — H2510 Age-related nuclear cataract, unspecified eye: Secondary | ICD-10-CM

## 2012-09-20 DIAGNOSIS — E11319 Type 2 diabetes mellitus with unspecified diabetic retinopathy without macular edema: Secondary | ICD-10-CM | POA: Diagnosis not present

## 2012-09-25 DIAGNOSIS — E1149 Type 2 diabetes mellitus with other diabetic neurological complication: Secondary | ICD-10-CM | POA: Diagnosis not present

## 2012-09-25 DIAGNOSIS — L609 Nail disorder, unspecified: Secondary | ICD-10-CM | POA: Diagnosis not present

## 2012-10-05 DIAGNOSIS — E1149 Type 2 diabetes mellitus with other diabetic neurological complication: Secondary | ICD-10-CM | POA: Diagnosis not present

## 2012-10-05 DIAGNOSIS — L97909 Non-pressure chronic ulcer of unspecified part of unspecified lower leg with unspecified severity: Secondary | ICD-10-CM | POA: Diagnosis not present

## 2012-10-05 DIAGNOSIS — E1169 Type 2 diabetes mellitus with other specified complication: Secondary | ICD-10-CM | POA: Diagnosis not present

## 2012-10-05 DIAGNOSIS — S81009A Unspecified open wound, unspecified knee, initial encounter: Secondary | ICD-10-CM | POA: Diagnosis not present

## 2012-10-16 DIAGNOSIS — N529 Male erectile dysfunction, unspecified: Secondary | ICD-10-CM | POA: Diagnosis not present

## 2012-10-16 DIAGNOSIS — E119 Type 2 diabetes mellitus without complications: Secondary | ICD-10-CM | POA: Diagnosis not present

## 2012-10-16 DIAGNOSIS — I10 Essential (primary) hypertension: Secondary | ICD-10-CM | POA: Diagnosis not present

## 2012-10-24 DIAGNOSIS — Z23 Encounter for immunization: Secondary | ICD-10-CM | POA: Diagnosis not present

## 2012-11-02 DIAGNOSIS — L97909 Non-pressure chronic ulcer of unspecified part of unspecified lower leg with unspecified severity: Secondary | ICD-10-CM | POA: Diagnosis not present

## 2012-11-02 DIAGNOSIS — Z09 Encounter for follow-up examination after completed treatment for conditions other than malignant neoplasm: Secondary | ICD-10-CM | POA: Diagnosis not present

## 2012-11-02 DIAGNOSIS — Z872 Personal history of diseases of the skin and subcutaneous tissue: Secondary | ICD-10-CM | POA: Diagnosis not present

## 2012-11-05 ENCOUNTER — Other Ambulatory Visit: Payer: Self-pay | Admitting: *Deleted

## 2012-11-05 DIAGNOSIS — IMO0002 Reserved for concepts with insufficient information to code with codable children: Secondary | ICD-10-CM | POA: Diagnosis not present

## 2012-11-05 MED ORDER — IBUPROFEN 600 MG PO TABS: 600.0000 mg | ORAL_TABLET | Freq: Three times a day (TID) | ORAL | Status: DC | PRN

## 2012-11-12 DIAGNOSIS — I10 Essential (primary) hypertension: Secondary | ICD-10-CM | POA: Diagnosis not present

## 2012-11-12 DIAGNOSIS — E119 Type 2 diabetes mellitus without complications: Secondary | ICD-10-CM | POA: Diagnosis not present

## 2012-11-15 DIAGNOSIS — I10 Essential (primary) hypertension: Secondary | ICD-10-CM | POA: Diagnosis not present

## 2012-12-04 DIAGNOSIS — L851 Acquired keratosis [keratoderma] palmaris et plantaris: Secondary | ICD-10-CM | POA: Diagnosis not present

## 2012-12-04 DIAGNOSIS — L609 Nail disorder, unspecified: Secondary | ICD-10-CM | POA: Diagnosis not present

## 2012-12-04 DIAGNOSIS — E1149 Type 2 diabetes mellitus with other diabetic neurological complication: Secondary | ICD-10-CM | POA: Diagnosis not present

## 2012-12-13 DIAGNOSIS — M47817 Spondylosis without myelopathy or radiculopathy, lumbosacral region: Secondary | ICD-10-CM | POA: Diagnosis not present

## 2013-01-22 DIAGNOSIS — M169 Osteoarthritis of hip, unspecified: Secondary | ICD-10-CM | POA: Diagnosis not present

## 2013-01-22 DIAGNOSIS — Z96659 Presence of unspecified artificial knee joint: Secondary | ICD-10-CM | POA: Diagnosis not present

## 2013-01-22 DIAGNOSIS — IMO0002 Reserved for concepts with insufficient information to code with codable children: Secondary | ICD-10-CM | POA: Diagnosis not present

## 2013-01-22 DIAGNOSIS — M1710 Unilateral primary osteoarthritis, unspecified knee: Secondary | ICD-10-CM | POA: Diagnosis not present

## 2013-01-22 DIAGNOSIS — Z96649 Presence of unspecified artificial hip joint: Secondary | ICD-10-CM | POA: Diagnosis not present

## 2013-01-22 DIAGNOSIS — M1610 Unilateral primary osteoarthritis, unspecified hip: Secondary | ICD-10-CM | POA: Diagnosis not present

## 2013-02-19 DIAGNOSIS — B351 Tinea unguium: Secondary | ICD-10-CM | POA: Diagnosis not present

## 2013-02-19 DIAGNOSIS — L851 Acquired keratosis [keratoderma] palmaris et plantaris: Secondary | ICD-10-CM | POA: Diagnosis not present

## 2013-03-20 ENCOUNTER — Ambulatory Visit (INDEPENDENT_AMBULATORY_CARE_PROVIDER_SITE_OTHER): Payer: Medicare Other | Admitting: Ophthalmology

## 2013-03-20 DIAGNOSIS — E11359 Type 2 diabetes mellitus with proliferative diabetic retinopathy without macular edema: Secondary | ICD-10-CM | POA: Diagnosis not present

## 2013-03-20 DIAGNOSIS — H43819 Vitreous degeneration, unspecified eye: Secondary | ICD-10-CM | POA: Diagnosis not present

## 2013-03-20 DIAGNOSIS — E1139 Type 2 diabetes mellitus with other diabetic ophthalmic complication: Secondary | ICD-10-CM | POA: Diagnosis not present

## 2013-03-20 DIAGNOSIS — E11319 Type 2 diabetes mellitus with unspecified diabetic retinopathy without macular edema: Secondary | ICD-10-CM

## 2013-03-20 DIAGNOSIS — E1165 Type 2 diabetes mellitus with hyperglycemia: Secondary | ICD-10-CM | POA: Diagnosis not present

## 2013-03-20 DIAGNOSIS — H2510 Age-related nuclear cataract, unspecified eye: Secondary | ICD-10-CM

## 2013-03-21 DIAGNOSIS — E119 Type 2 diabetes mellitus without complications: Secondary | ICD-10-CM | POA: Diagnosis not present

## 2013-03-21 DIAGNOSIS — R609 Edema, unspecified: Secondary | ICD-10-CM | POA: Diagnosis not present

## 2013-03-26 DIAGNOSIS — H40009 Preglaucoma, unspecified, unspecified eye: Secondary | ICD-10-CM | POA: Diagnosis not present

## 2013-03-29 DIAGNOSIS — H40019 Open angle with borderline findings, low risk, unspecified eye: Secondary | ICD-10-CM | POA: Diagnosis not present

## 2013-03-29 DIAGNOSIS — H2589 Other age-related cataract: Secondary | ICD-10-CM | POA: Diagnosis not present

## 2013-03-29 DIAGNOSIS — E11359 Type 2 diabetes mellitus with proliferative diabetic retinopathy without macular edema: Secondary | ICD-10-CM | POA: Diagnosis not present

## 2013-03-29 DIAGNOSIS — H2510 Age-related nuclear cataract, unspecified eye: Secondary | ICD-10-CM | POA: Diagnosis not present

## 2013-03-29 DIAGNOSIS — H353190 Nonexudative age-related macular degeneration, unspecified eye, stage unspecified: Secondary | ICD-10-CM | POA: Diagnosis not present

## 2013-03-29 DIAGNOSIS — E1139 Type 2 diabetes mellitus with other diabetic ophthalmic complication: Secondary | ICD-10-CM | POA: Diagnosis not present

## 2013-04-19 DIAGNOSIS — H2510 Age-related nuclear cataract, unspecified eye: Secondary | ICD-10-CM | POA: Diagnosis not present

## 2013-04-19 DIAGNOSIS — H2589 Other age-related cataract: Secondary | ICD-10-CM | POA: Diagnosis not present

## 2013-04-19 DIAGNOSIS — H40059 Ocular hypertension, unspecified eye: Secondary | ICD-10-CM | POA: Diagnosis not present

## 2013-05-14 DIAGNOSIS — B351 Tinea unguium: Secondary | ICD-10-CM | POA: Diagnosis not present

## 2013-05-14 DIAGNOSIS — L851 Acquired keratosis [keratoderma] palmaris et plantaris: Secondary | ICD-10-CM | POA: Diagnosis not present

## 2013-05-17 DIAGNOSIS — I10 Essential (primary) hypertension: Secondary | ICD-10-CM | POA: Diagnosis not present

## 2013-05-17 DIAGNOSIS — IMO0001 Reserved for inherently not codable concepts without codable children: Secondary | ICD-10-CM | POA: Diagnosis not present

## 2013-07-30 DIAGNOSIS — L851 Acquired keratosis [keratoderma] palmaris et plantaris: Secondary | ICD-10-CM | POA: Diagnosis not present

## 2013-07-30 DIAGNOSIS — B351 Tinea unguium: Secondary | ICD-10-CM | POA: Diagnosis not present

## 2013-07-30 DIAGNOSIS — E1149 Type 2 diabetes mellitus with other diabetic neurological complication: Secondary | ICD-10-CM | POA: Diagnosis not present

## 2013-08-19 DIAGNOSIS — C4441 Basal cell carcinoma of skin of scalp and neck: Secondary | ICD-10-CM | POA: Diagnosis not present

## 2013-08-21 DIAGNOSIS — H40059 Ocular hypertension, unspecified eye: Secondary | ICD-10-CM | POA: Diagnosis not present

## 2013-08-21 DIAGNOSIS — H2589 Other age-related cataract: Secondary | ICD-10-CM | POA: Diagnosis not present

## 2013-08-21 DIAGNOSIS — H2510 Age-related nuclear cataract, unspecified eye: Secondary | ICD-10-CM | POA: Diagnosis not present

## 2013-09-23 ENCOUNTER — Ambulatory Visit (INDEPENDENT_AMBULATORY_CARE_PROVIDER_SITE_OTHER): Payer: Medicare Other | Admitting: Ophthalmology

## 2013-10-08 ENCOUNTER — Other Ambulatory Visit: Payer: Self-pay | Admitting: Family Medicine

## 2013-10-18 DIAGNOSIS — I10 Essential (primary) hypertension: Secondary | ICD-10-CM | POA: Diagnosis not present

## 2013-10-18 DIAGNOSIS — E119 Type 2 diabetes mellitus without complications: Secondary | ICD-10-CM | POA: Diagnosis not present

## 2013-10-18 DIAGNOSIS — B351 Tinea unguium: Secondary | ICD-10-CM | POA: Diagnosis not present

## 2013-10-18 DIAGNOSIS — L851 Acquired keratosis [keratoderma] palmaris et plantaris: Secondary | ICD-10-CM | POA: Diagnosis not present

## 2013-10-18 DIAGNOSIS — E1142 Type 2 diabetes mellitus with diabetic polyneuropathy: Secondary | ICD-10-CM | POA: Diagnosis not present

## 2013-10-23 DIAGNOSIS — Z23 Encounter for immunization: Secondary | ICD-10-CM | POA: Diagnosis not present

## 2013-10-25 DIAGNOSIS — I10 Essential (primary) hypertension: Secondary | ICD-10-CM | POA: Diagnosis not present

## 2013-10-25 DIAGNOSIS — E119 Type 2 diabetes mellitus without complications: Secondary | ICD-10-CM | POA: Diagnosis not present

## 2013-10-25 DIAGNOSIS — D509 Iron deficiency anemia, unspecified: Secondary | ICD-10-CM | POA: Diagnosis not present

## 2013-11-05 DIAGNOSIS — Z23 Encounter for immunization: Secondary | ICD-10-CM | POA: Diagnosis not present

## 2013-12-24 DIAGNOSIS — H25812 Combined forms of age-related cataract, left eye: Secondary | ICD-10-CM | POA: Diagnosis not present

## 2013-12-24 DIAGNOSIS — H2511 Age-related nuclear cataract, right eye: Secondary | ICD-10-CM | POA: Diagnosis not present

## 2013-12-24 DIAGNOSIS — H40053 Ocular hypertension, bilateral: Secondary | ICD-10-CM | POA: Diagnosis not present

## 2013-12-27 DIAGNOSIS — B351 Tinea unguium: Secondary | ICD-10-CM | POA: Diagnosis not present

## 2013-12-27 DIAGNOSIS — E1142 Type 2 diabetes mellitus with diabetic polyneuropathy: Secondary | ICD-10-CM | POA: Diagnosis not present

## 2013-12-27 DIAGNOSIS — L851 Acquired keratosis [keratoderma] palmaris et plantaris: Secondary | ICD-10-CM | POA: Diagnosis not present

## 2014-01-18 IMAGING — CR DG HIP (WITH OR WITHOUT PELVIS) 2-3V*L*
3 series · 3 of 3 positions shown · non-contrast
Comparison: 02/02/2011 at

CLINICAL DATA: Preop for left hip replacement.  The patient with
hip pain and limited motion.

LEFT HIP - COMPLETE 2+ VIEW

[t pelvis a.p.]
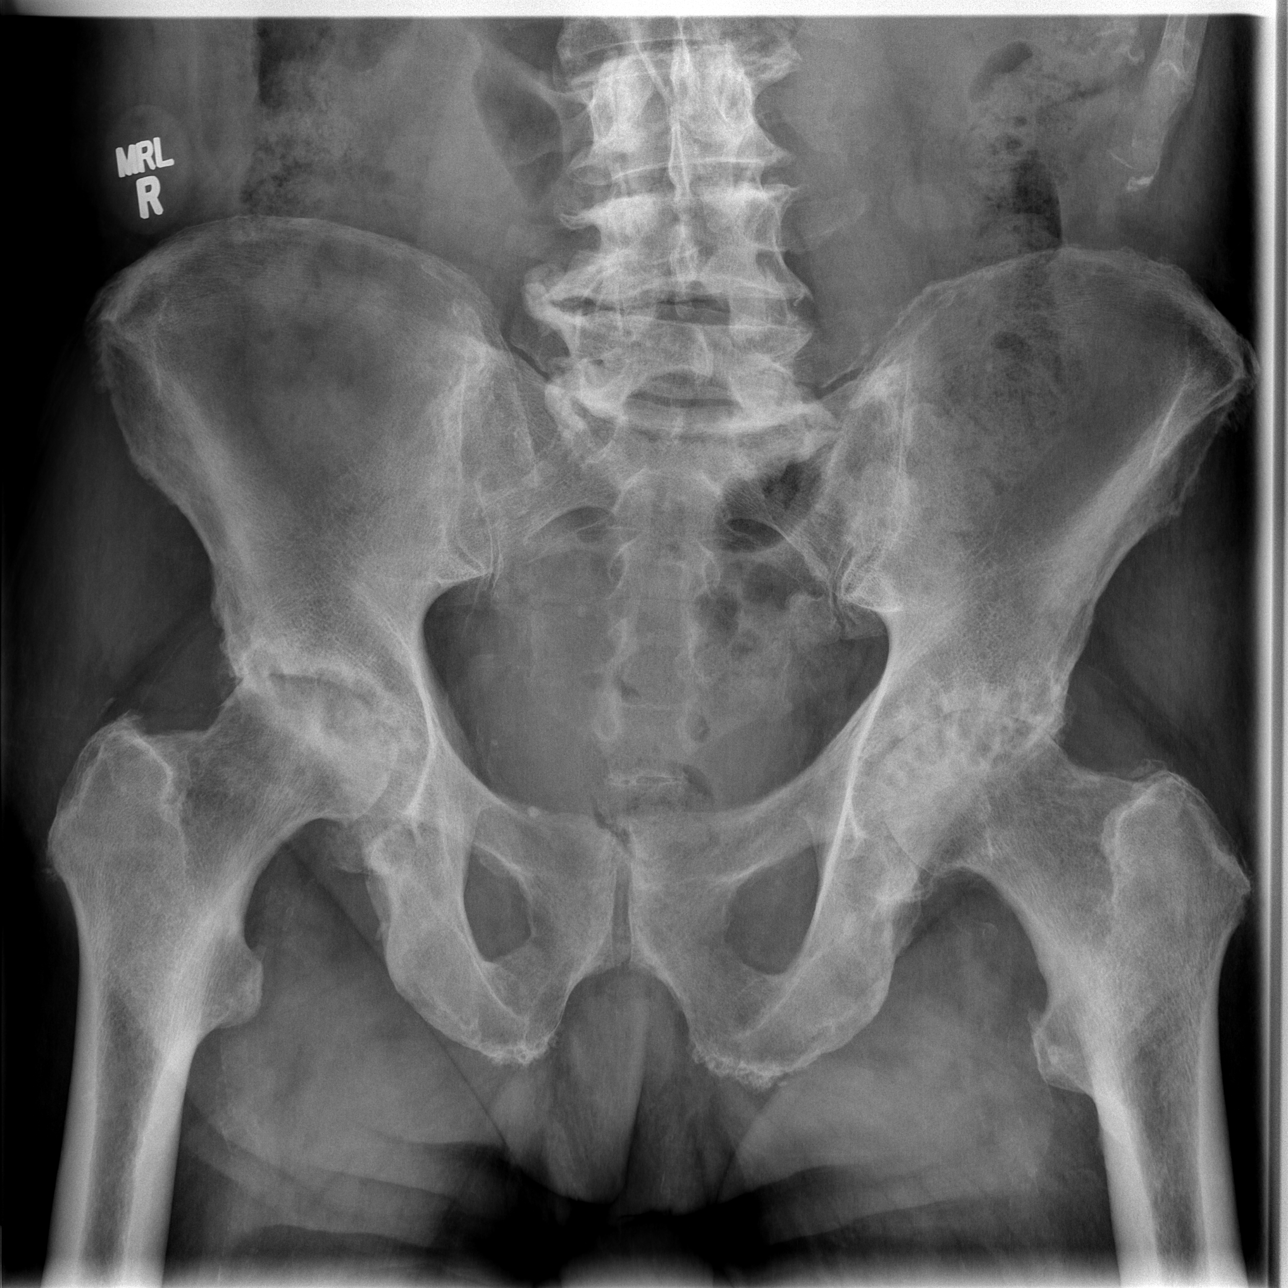

[t hip ap left]
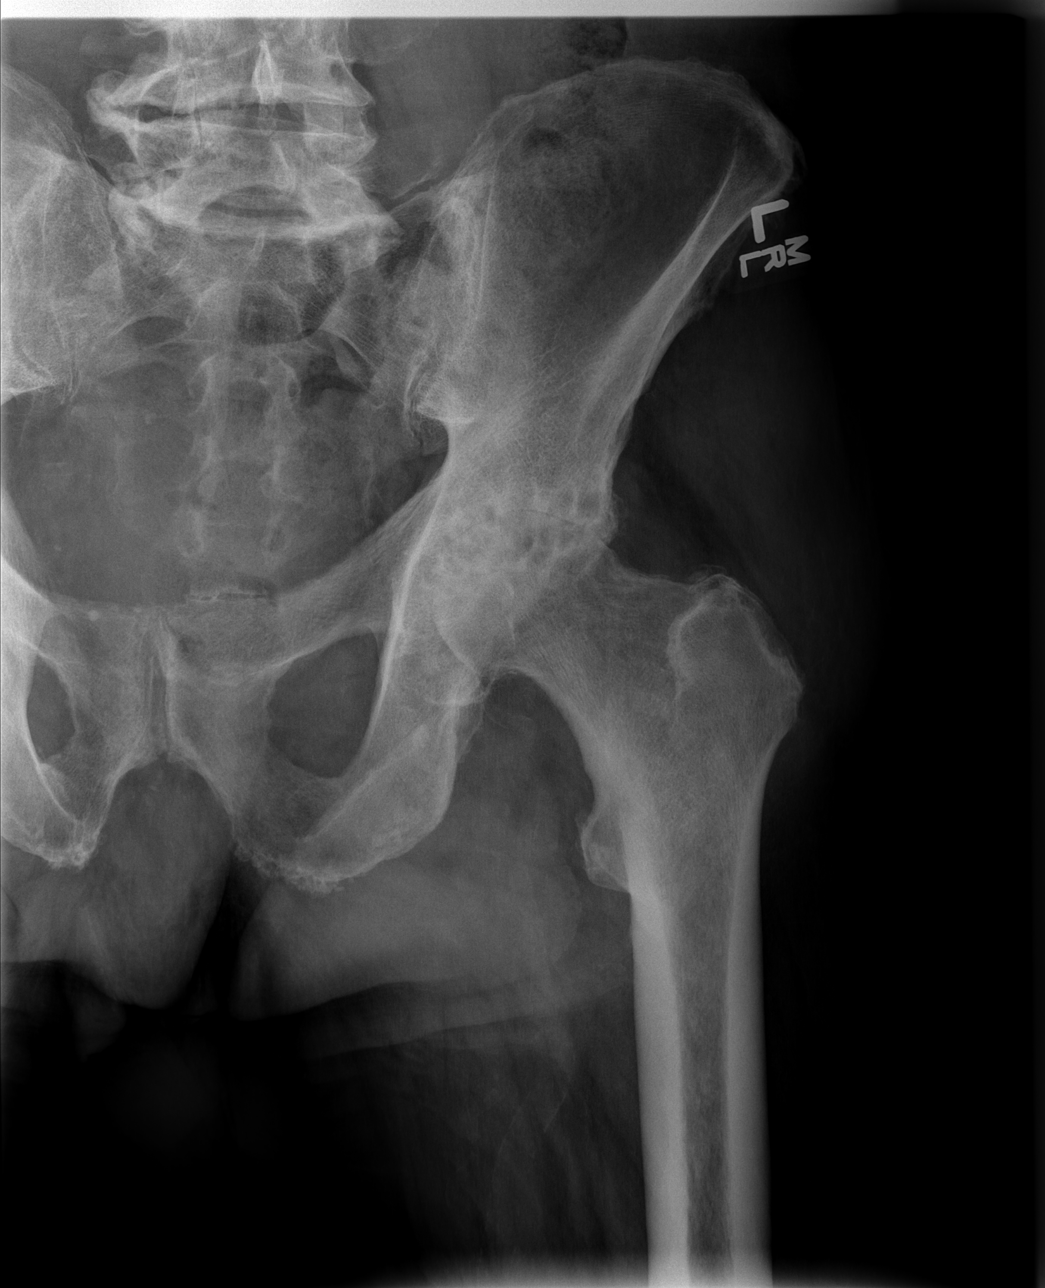

[t hip frog leg left]
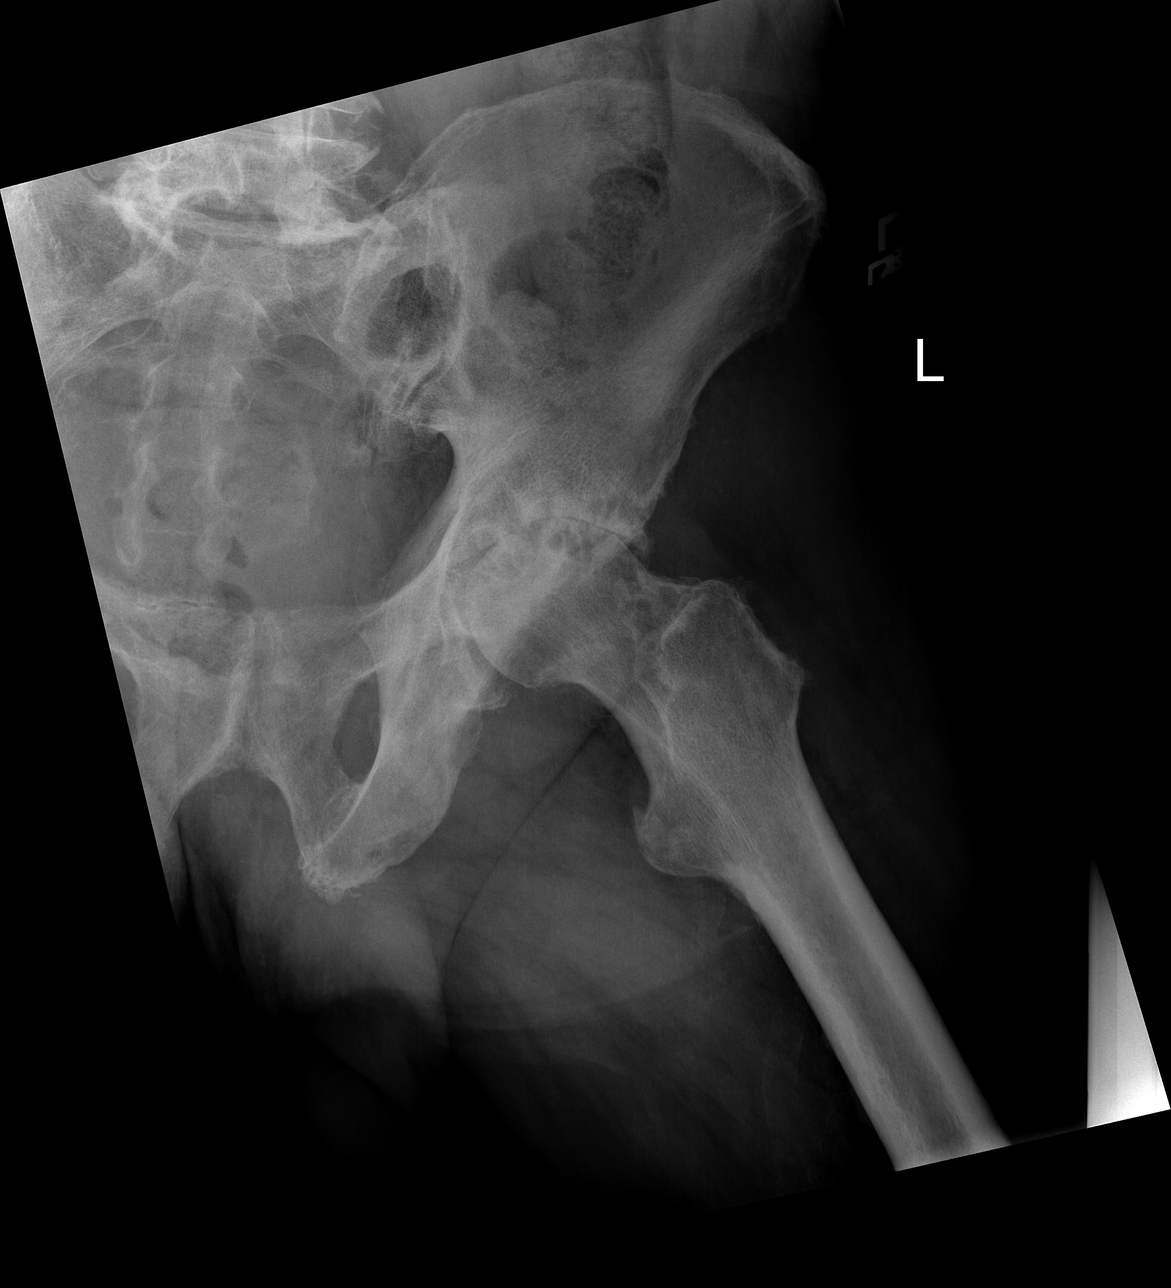

[3 of 3 positions shown; findings below may reference images not displayed]

FINDINGS: There are advanced arthropathic changes of both hips with
essentially complete  loss of the normal hip joint space along the
medial and superior aspects, subchondral sclerosis and prominent
subchondral cystic change and some remodeling and flattening of the
femoral heads, which is most prominent on the right.  The findings
have advanced on the right since the prior study.

There is no fracture.  The bones are demineralized.  The SI joints
are normally spaced and aligned as is the symphysis pubis.  There
are degenerative changes of the visible lower lumbar spine.  The
soft tissues are unremarkable.
IMPRESSION: Advanced arthropathic changes of both hips, more severe on the
right than the left, but both severe.  Changes have increased from
the prior study on the right.  There may be a superimposed
avascular necrosis on the right given the depression of the
superior right femoral head.

## 2014-03-05 DIAGNOSIS — I10 Essential (primary) hypertension: Secondary | ICD-10-CM | POA: Diagnosis not present

## 2014-03-05 DIAGNOSIS — E119 Type 2 diabetes mellitus without complications: Secondary | ICD-10-CM | POA: Diagnosis not present

## 2014-03-07 DIAGNOSIS — B351 Tinea unguium: Secondary | ICD-10-CM | POA: Diagnosis not present

## 2014-03-07 DIAGNOSIS — L851 Acquired keratosis [keratoderma] palmaris et plantaris: Secondary | ICD-10-CM | POA: Diagnosis not present

## 2014-03-07 DIAGNOSIS — E1142 Type 2 diabetes mellitus with diabetic polyneuropathy: Secondary | ICD-10-CM | POA: Diagnosis not present

## 2014-03-10 DIAGNOSIS — E875 Hyperkalemia: Secondary | ICD-10-CM | POA: Diagnosis not present

## 2014-03-10 DIAGNOSIS — I10 Essential (primary) hypertension: Secondary | ICD-10-CM | POA: Diagnosis not present

## 2014-03-10 DIAGNOSIS — D509 Iron deficiency anemia, unspecified: Secondary | ICD-10-CM | POA: Diagnosis not present

## 2014-03-10 DIAGNOSIS — E1122 Type 2 diabetes mellitus with diabetic chronic kidney disease: Secondary | ICD-10-CM | POA: Diagnosis not present

## 2014-05-10 IMAGING — CR DG HIP COMPLETE 2+V*R*
3 series · 3 of 3 positions shown · non-contrast
Comparison: 02/02/2011

CLINICAL DATA: Preoperative assessment for right total hip
replacement

RIGHT HIP - COMPLETE 2+ VIEW

[t pelvis a.p.]
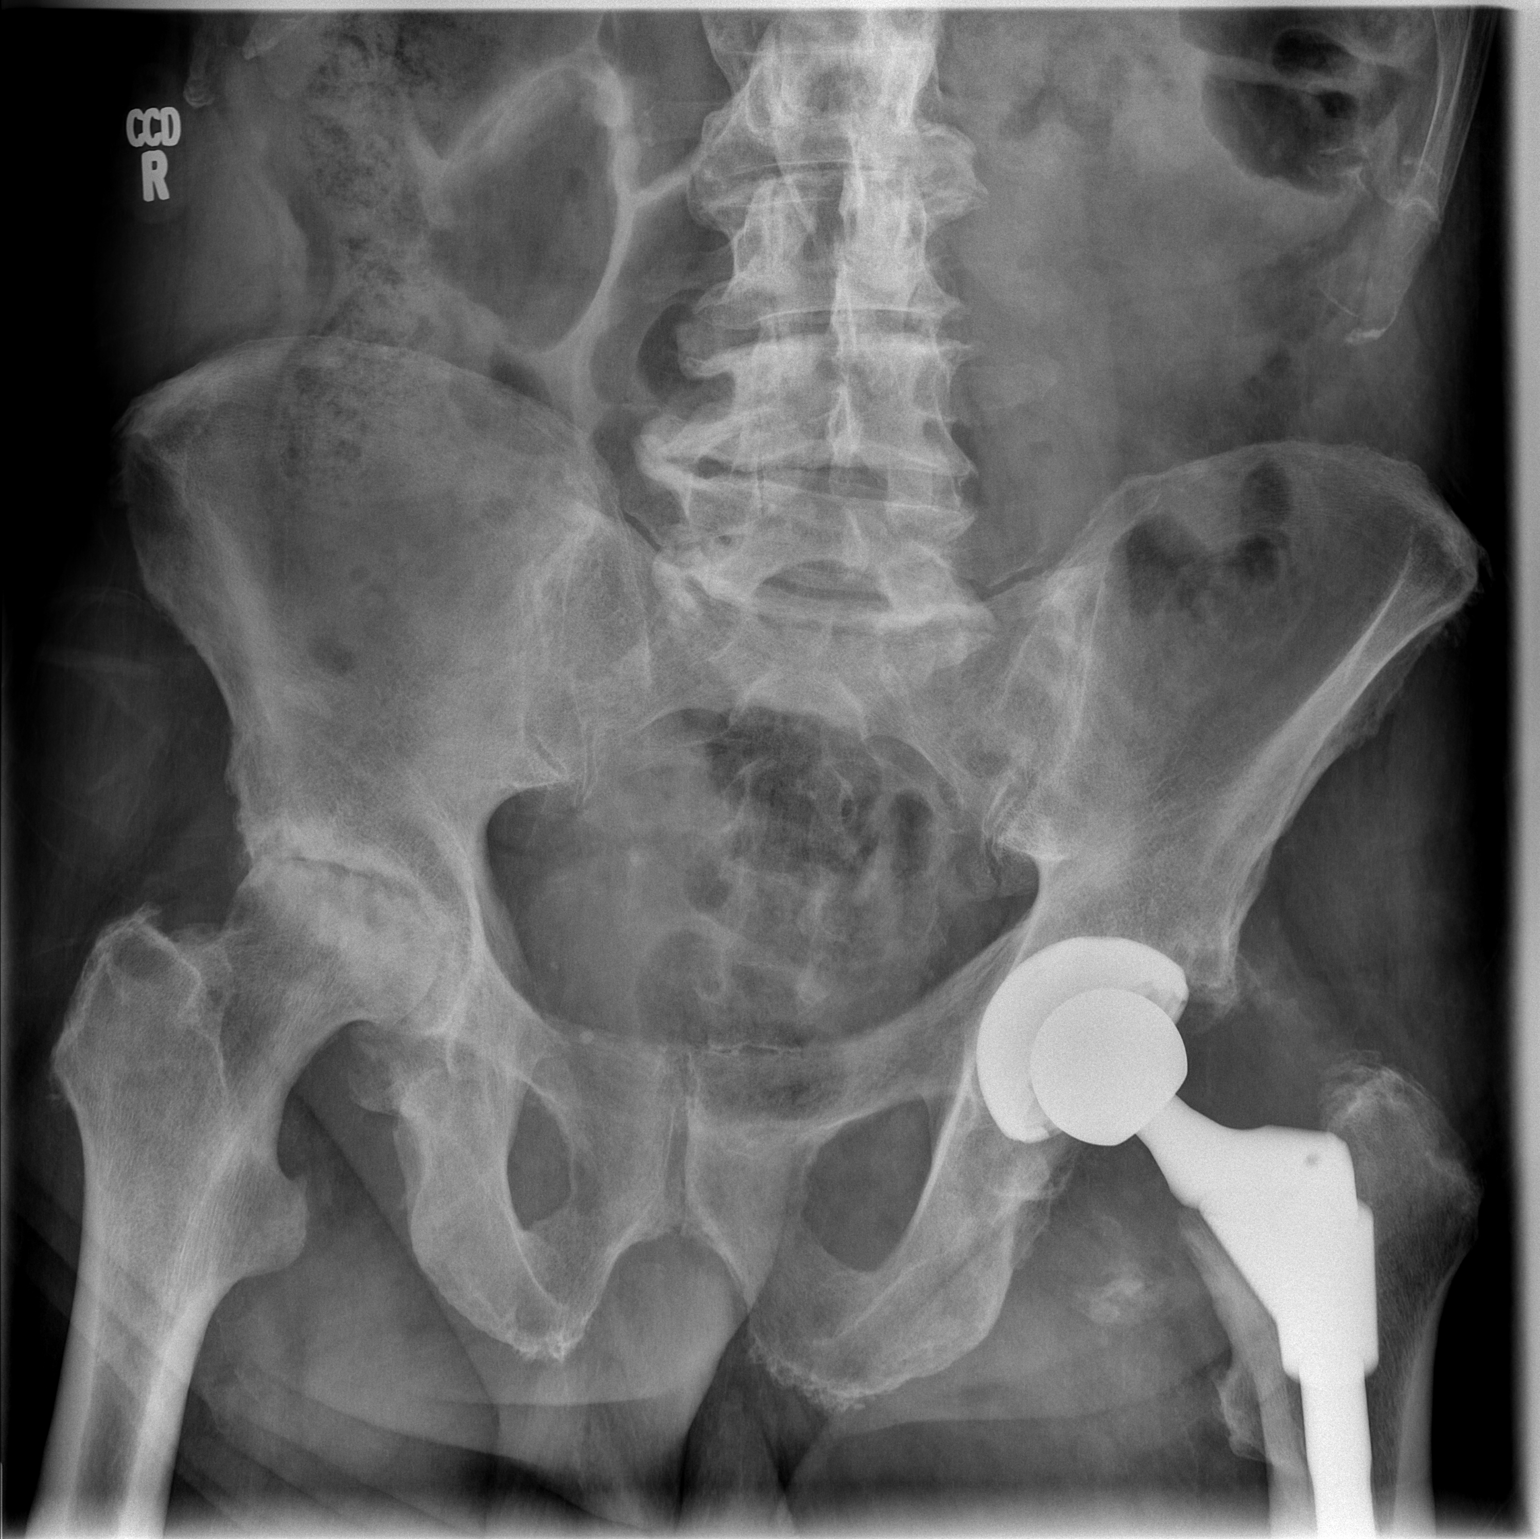

[t hip ap left]
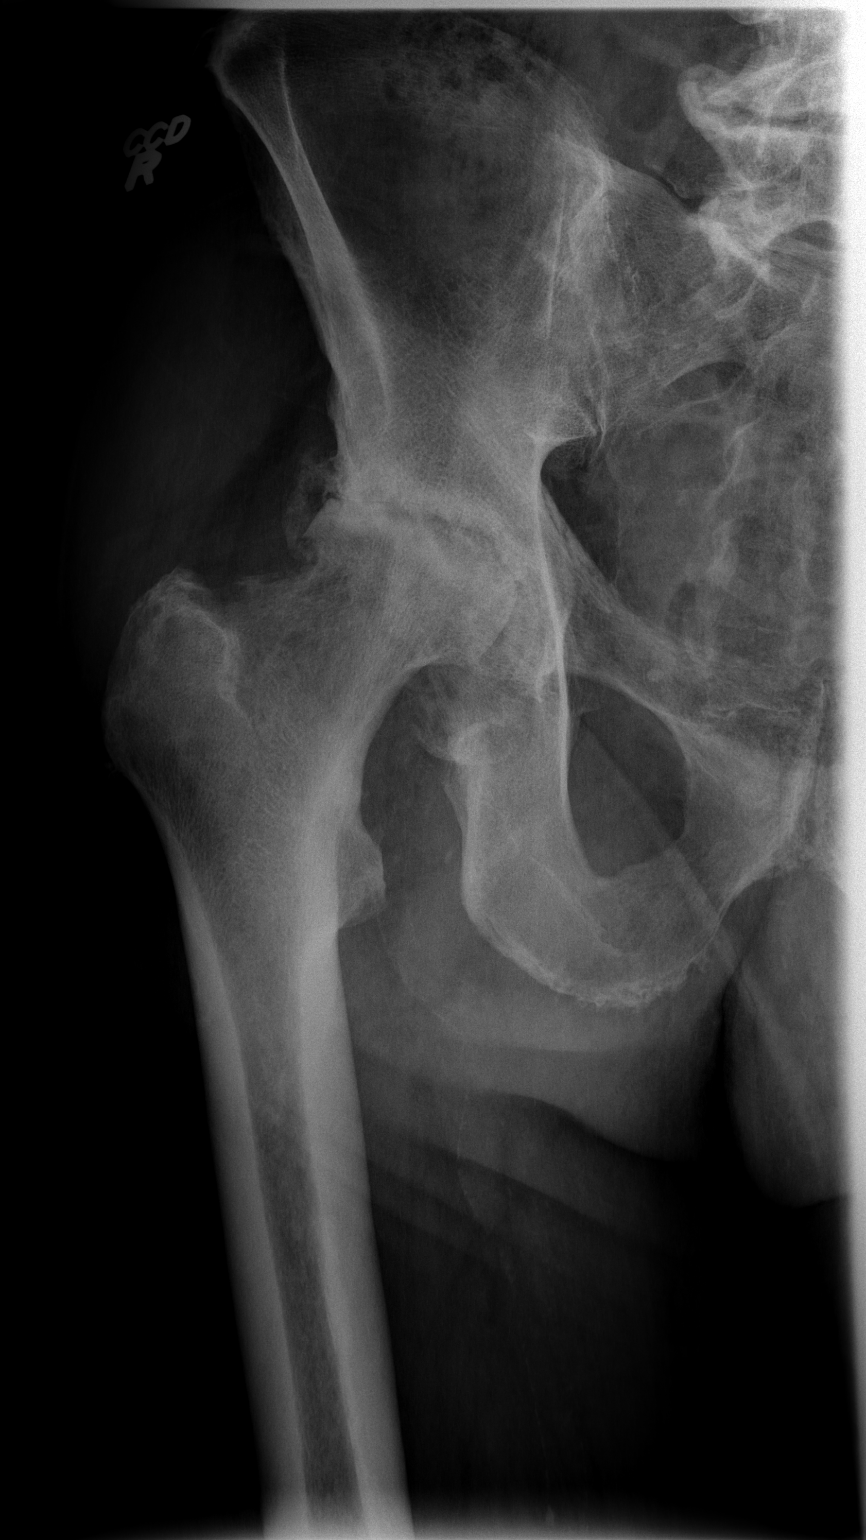

[t hip frog leg left]
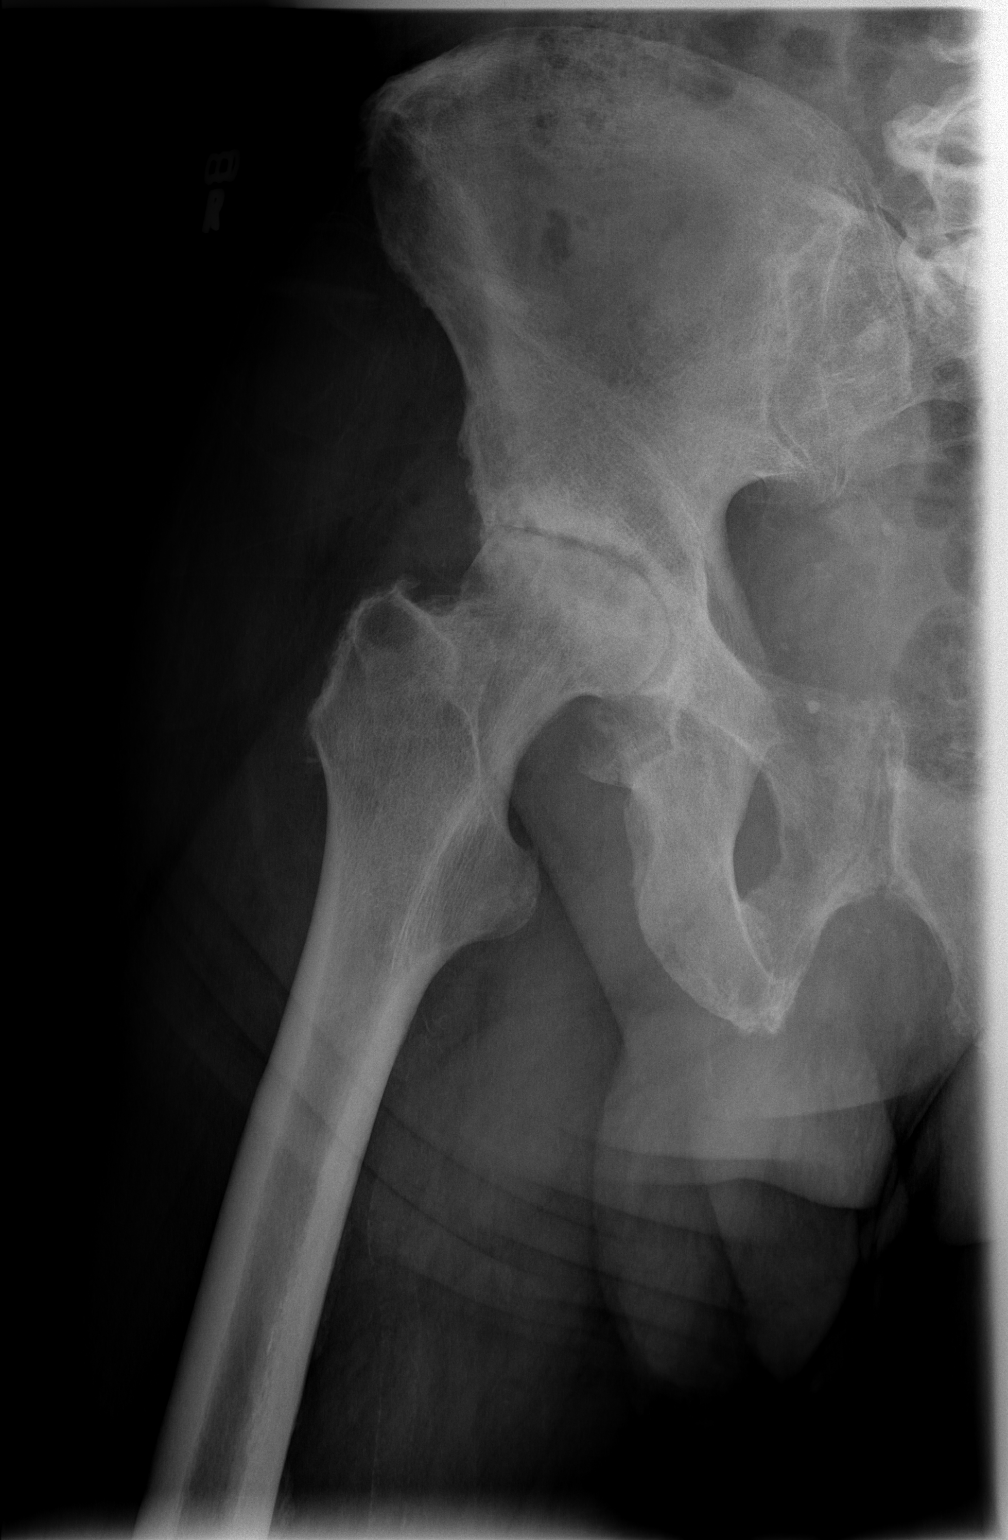

[3 of 3 positions shown; findings below may reference images not displayed]

FINDINGS: Components of left hip prosthesis are newly identified.
Osseous mineralization appears grossly stable.
Severe osteoarthritic changes of the right hip joint are identified
with progressive joint space narrowing and subchondral sclerosis.
Destruction and flattening of the right femoral head versus
previous exam question secondary to avascular necrosis.
No acute fracture or dislocation.
Multilevel degenerative disc disease changes lower lumbar spine.
IMPRESSION: Severe osteoarthritic changes of the right hip joint suspect
secondary to avascular necrosis of the right femoral head with
femoral head collapse.
Changes are markedly progressive since 02/02/2011.

## 2014-05-16 DIAGNOSIS — L851 Acquired keratosis [keratoderma] palmaris et plantaris: Secondary | ICD-10-CM | POA: Diagnosis not present

## 2014-05-16 DIAGNOSIS — B351 Tinea unguium: Secondary | ICD-10-CM | POA: Diagnosis not present

## 2014-05-16 DIAGNOSIS — E1142 Type 2 diabetes mellitus with diabetic polyneuropathy: Secondary | ICD-10-CM | POA: Diagnosis not present

## 2014-06-24 DIAGNOSIS — H2513 Age-related nuclear cataract, bilateral: Secondary | ICD-10-CM | POA: Diagnosis not present

## 2014-06-24 DIAGNOSIS — H40053 Ocular hypertension, bilateral: Secondary | ICD-10-CM | POA: Diagnosis not present

## 2014-07-07 ENCOUNTER — Telehealth: Payer: Self-pay | Admitting: Family Medicine

## 2014-07-07 NOTE — Telephone Encounter (Signed)
This patient used to be under the care of our practice. A few years ago he left to go to Dr. Gwyndolyn Saxon. Apparently now he would like to come back to our practice. He had talked with Pamala Hurry and wanted to speak directly with me. I do not have the time to discuss currently. Let the patient know that we are trying to help him he can be a little challenging at times . I am not opposed toward accepting this patient back into the practice but I would recommend that he be called first to find out what was the reason he left. Here is his phone number-[phone], please call talk with patient find out the reason behind his exit from our practice, thank you for your help

## 2014-07-11 DIAGNOSIS — E119 Type 2 diabetes mellitus without complications: Secondary | ICD-10-CM | POA: Diagnosis not present

## 2014-07-11 DIAGNOSIS — I10 Essential (primary) hypertension: Secondary | ICD-10-CM | POA: Diagnosis not present

## 2014-07-17 DIAGNOSIS — N183 Chronic kidney disease, stage 3 (moderate): Secondary | ICD-10-CM | POA: Diagnosis not present

## 2014-07-17 DIAGNOSIS — E782 Mixed hyperlipidemia: Secondary | ICD-10-CM | POA: Diagnosis not present

## 2014-07-17 DIAGNOSIS — E1122 Type 2 diabetes mellitus with diabetic chronic kidney disease: Secondary | ICD-10-CM | POA: Diagnosis not present

## 2014-07-25 DIAGNOSIS — B351 Tinea unguium: Secondary | ICD-10-CM | POA: Diagnosis not present

## 2014-07-25 DIAGNOSIS — L851 Acquired keratosis [keratoderma] palmaris et plantaris: Secondary | ICD-10-CM | POA: Diagnosis not present

## 2014-07-25 DIAGNOSIS — E1142 Type 2 diabetes mellitus with diabetic polyneuropathy: Secondary | ICD-10-CM | POA: Diagnosis not present

## 2014-07-28 ENCOUNTER — Inpatient Hospital Stay (HOSPITAL_COMMUNITY)
Admission: AD | Admit: 2014-07-28 | Discharge: 2014-08-01 | DRG: 502 | Disposition: A | Discharge status: Home / Self Care | Payer: Medicare Other | Source: Ambulatory Visit | Attending: Orthopedic Surgery | Admitting: Orthopedic Surgery

## 2014-07-28 ENCOUNTER — Encounter (HOSPITAL_COMMUNITY): Payer: Self-pay

## 2014-07-28 DIAGNOSIS — M1990 Unspecified osteoarthritis, unspecified site: Secondary | ICD-10-CM | POA: Diagnosis not present

## 2014-07-28 DIAGNOSIS — Z794 Long term (current) use of insulin: Secondary | ICD-10-CM | POA: Diagnosis not present

## 2014-07-28 DIAGNOSIS — Z96652 Presence of left artificial knee joint: Secondary | ICD-10-CM | POA: Diagnosis not present

## 2014-07-28 DIAGNOSIS — Z79899 Other long term (current) drug therapy: Secondary | ICD-10-CM | POA: Diagnosis not present

## 2014-07-28 DIAGNOSIS — Z7901 Long term (current) use of anticoagulants: Secondary | ICD-10-CM | POA: Diagnosis not present

## 2014-07-28 DIAGNOSIS — E119 Type 2 diabetes mellitus without complications: Secondary | ICD-10-CM | POA: Diagnosis not present

## 2014-07-28 DIAGNOSIS — M726 Necrotizing fasciitis: Secondary | ICD-10-CM | POA: Diagnosis present

## 2014-07-28 DIAGNOSIS — Z96653 Presence of artificial knee joint, bilateral: Secondary | ICD-10-CM | POA: Diagnosis present

## 2014-07-28 DIAGNOSIS — T8459XA Infection and inflammatory reaction due to other internal joint prosthesis, initial encounter: Secondary | ICD-10-CM | POA: Diagnosis not present

## 2014-07-28 DIAGNOSIS — M71062 Abscess of bursa, left knee: Principal | ICD-10-CM | POA: Diagnosis present

## 2014-07-28 DIAGNOSIS — L02419 Cutaneous abscess of limb, unspecified: Secondary | ICD-10-CM | POA: Diagnosis present

## 2014-07-28 DIAGNOSIS — Z96643 Presence of artificial hip joint, bilateral: Secondary | ICD-10-CM | POA: Diagnosis not present

## 2014-07-28 DIAGNOSIS — Z87891 Personal history of nicotine dependence: Secondary | ICD-10-CM | POA: Diagnosis not present

## 2014-07-28 DIAGNOSIS — Z885 Allergy status to narcotic agent status: Secondary | ICD-10-CM

## 2014-07-28 DIAGNOSIS — M25562 Pain in left knee: Secondary | ICD-10-CM | POA: Diagnosis not present

## 2014-07-28 DIAGNOSIS — Z79891 Long term (current) use of opiate analgesic: Secondary | ICD-10-CM

## 2014-07-28 DIAGNOSIS — Z791 Long term (current) use of non-steroidal anti-inflammatories (NSAID): Secondary | ICD-10-CM | POA: Diagnosis not present

## 2014-07-28 DIAGNOSIS — M71162 Other infective bursitis, left knee: Secondary | ICD-10-CM | POA: Diagnosis not present

## 2014-07-28 LAB — CREATININE, SERUM
CREATININE: 1.61 mg/dL — AB (ref 0.61–1.24)
GFR calc Af Amer: 45 mL/min — ABNORMAL LOW (ref >60)
GFR, EST NON AFRICAN AMERICAN: 39 mL/min — AB (ref >60)

## 2014-07-28 LAB — GLUCOSE, CAPILLARY
Glucose-Capillary: 244 mg/dL — ABNORMAL HIGH (ref 65–99)
Glucose-Capillary: 248 mg/dL — ABNORMAL HIGH (ref 65–99)

## 2014-07-28 MED ORDER — FLEET ENEMA 7-19 GM/118ML RE ENEM: 1.0000 | ENEMA | Freq: Once | RECTAL | Status: AC | PRN

## 2014-07-28 MED ORDER — BISACODYL 10 MG RE SUPP: 10.0000 mg | Freq: Every day | RECTAL | Status: DC | PRN

## 2014-07-28 MED ORDER — ALUM & MAG HYDROXIDE-SIMETH 200-200-20 MG/5ML PO SUSP: 30.0000 mL | Freq: Four times a day (QID) | ORAL | Status: DC | PRN

## 2014-07-28 MED ORDER — INSULIN ASPART 100 UNIT/ML ~~LOC~~ SOLN
0.0000 [IU] | Freq: Every day | SUBCUTANEOUS | Status: DC
  Administered 2014-07-28: 2 [IU] via SUBCUTANEOUS

## 2014-07-28 MED ORDER — SODIUM CHLORIDE 0.9 % IJ SOLN: 3.0000 mL | INTRAMUSCULAR | Status: DC | PRN

## 2014-07-28 MED ORDER — ACETAMINOPHEN 650 MG RE SUPP: 650.0000 mg | Freq: Four times a day (QID) | RECTAL | Status: DC | PRN

## 2014-07-28 MED ORDER — VANCOMYCIN HCL 10 G IV SOLR
1250.0000 mg | INTRAVENOUS | Status: DC
  Administered 2014-07-29 – 2014-07-30 (×2): 1250 mg via INTRAVENOUS
  Filled 2014-07-28 (×3): qty 1250

## 2014-07-28 MED ORDER — ONDANSETRON HCL 4 MG/2ML IJ SOLN: 4.0000 mg | Freq: Four times a day (QID) | INTRAMUSCULAR | Status: DC | PRN

## 2014-07-28 MED ORDER — LISINOPRIL 2.5 MG PO TABS
2.5000 mg | ORAL_TABLET | Freq: Every day | ORAL | Status: DC
  Administered 2014-07-29: 2.5 mg via ORAL
  Filled 2014-07-28 (×2): qty 1

## 2014-07-28 MED ORDER — FERROUS SULFATE 325 (65 FE) MG PO TABS
325.0000 mg | ORAL_TABLET | Freq: Every day | ORAL | Status: DC
  Administered 2014-07-29 – 2014-08-01 (×4): 325 mg via ORAL
  Filled 2014-07-28 (×5): qty 1

## 2014-07-28 MED ORDER — SODIUM CHLORIDE 0.9 % IV SOLN
INTRAVENOUS | Status: DC
  Administered 2014-07-28 – 2014-07-31 (×3): via INTRAVENOUS

## 2014-07-28 MED ORDER — SODIUM CHLORIDE 0.9 % IV SOLN: 250.0000 mL | INTRAVENOUS | Status: DC | PRN

## 2014-07-28 MED ORDER — ONDANSETRON HCL 4 MG PO TABS: 4.0000 mg | ORAL_TABLET | Freq: Four times a day (QID) | ORAL | Status: DC | PRN

## 2014-07-28 MED ORDER — OXYCODONE HCL 5 MG PO TABS: 5.0000 mg | ORAL_TABLET | ORAL | Status: DC | PRN

## 2014-07-28 MED ORDER — ACETAMINOPHEN 325 MG PO TABS
650.0000 mg | ORAL_TABLET | Freq: Four times a day (QID) | ORAL | Status: DC | PRN
  Administered 2014-07-28: 325 mg via ORAL
  Filled 2014-07-28: qty 2

## 2014-07-28 MED ORDER — INSULIN ASPART 100 UNIT/ML ~~LOC~~ SOLN
0.0000 [IU] | Freq: Three times a day (TID) | SUBCUTANEOUS | Status: DC
  Administered 2014-07-28 – 2014-07-29 (×3): 5 [IU] via SUBCUTANEOUS

## 2014-07-28 MED ORDER — VANCOMYCIN HCL IN DEXTROSE 1-5 GM/200ML-% IV SOLN
1000.0000 mg | Freq: Once | INTRAVENOUS | Status: AC
  Administered 2014-07-28: 1000 mg via INTRAVENOUS
  Filled 2014-07-28: qty 200

## 2014-07-28 MED ORDER — DOCUSATE SODIUM 100 MG PO CAPS
100.0000 mg | ORAL_CAPSULE | Freq: Two times a day (BID) | ORAL | Status: DC
  Administered 2014-07-28 – 2014-08-01 (×6): 100 mg via ORAL

## 2014-07-28 MED ORDER — SODIUM CHLORIDE 0.9 % IJ SOLN
3.0000 mL | Freq: Two times a day (BID) | INTRAMUSCULAR | Status: DC
  Administered 2014-07-29: 3 mL via INTRAVENOUS

## 2014-07-28 MED ORDER — POLYETHYLENE GLYCOL 3350 17 G PO PACK: 17.0000 g | PACK | Freq: Every day | ORAL | Status: DC | PRN

## 2014-07-28 NOTE — Progress Notes (Signed)
ANTIBIOTIC CONSULT NOTE - INITIAL  Pharmacy Consult for Vancomycin Indication: Cellulitis  Allergies  Allergen Reactions  . Morphine And Related Nausea And Vomiting    Patient Measurements: Height: 5\' 9"  (175.3 cm) Weight: 198 lb (89.812 kg) IBW/kg (Calculated) : 70.7  Vital Signs:   Intake/Output from previous day:   Intake/Output from this shift:    Labs:  Recent Labs  07/28/14 1830  CREATININE 1.61*   Estimated Creatinine Clearance: 40.5 mL/min (by C-G formula based on Cr of 1.61). No results for input(s): VANCOTROUGH, VANCOPEAK, VANCORANDOM, GENTTROUGH, GENTPEAK, GENTRANDOM, TOBRATROUGH, TOBRAPEAK, TOBRARND, AMIKACINPEAK, AMIKACINTROU, AMIKACIN in the last 72 hours.   Microbiology: No results found for this or any previous visit (from the past 720 hour(s)).  Medical History: Past Medical History  Diagnosis Date  . Diabetes mellitus without complication     takes lisinopril for kidneys  . Arthritis   . S/P knee replacement left 2011, right 2012    Medications:  Scheduled:  . docusate sodium  100 mg Oral BID  . [START ON 07/29/2014] ferrous sulfate  325 mg Oral Q breakfast  . insulin aspart  0-15 Units Subcutaneous TID WC  . insulin aspart  0-5 Units Subcutaneous QHS  . [START ON 07/29/2014] lisinopril  2.5 mg Oral QAC breakfast  . sodium chloride  3 mL Intravenous Q12H  . [START ON 07/29/2014] vancomycin  1,250 mg Intravenous Q24H  . vancomycin  1,000 mg Intravenous Once   Infusions:  . sodium chloride 75 mL/hr at 07/28/14 1500   PRN: sodium chloride, acetaminophen **OR** acetaminophen, alum & mag hydroxide-simeth, bisacodyl, ondansetron **OR** ondansetron (ZOFRAN) IV, oxyCODONE, polyethylene glycol, sodium chloride, sodium phosphate  Assessment: 81 yo male presents with knee pain and drainage s/p cutting it on his fence last week. Ortho suspects infected pre-patellar bursa. Cultures were obtained in the office for anaerobic, aerobic, and gram stain. Will  give IV antibiotics tonight and reassess in AM for need for possible surgery tomorrow.   Goal of Therapy:  Vancomycin trough level 15-20 mcg/ml  Plan:   Vancomycin 1g IV x 1 per MD, then  Vancomycin 1250mg  IV q24h starting 7/19 Check trough at steady state Follow up renal function & cultures, clinical course  Peggyann Juba, PharmD, BCPS Pager: [phone] 07/28/2014,7:06 PM

## 2014-07-28 NOTE — H&P (Signed)
[name] is an 81 y.o. male.   Chief Complaint: left knee pain and drainage HPI: The patient is an 81 year old male who presented with the chief complaint of left knee pain and drainage. He reports that last week he was working in the yard and was leaning against a fence. He had a cut on his left knee from the fence. He says that it was mostly an abrasion as he did not have any actively bleeding. He reports that over the past couple days he has been feeling "bad". This morning when he got out of the shower he noticed drainage from the left knee. He was scheduled to her eye surgery today so when he presented to the eye surgeon, they cancelled his surgery due to the concern about infection. He has significant pain with walking and presented in a wheelchair today.   Past Medical History  Diagnosis Date  . Diabetes mellitus without complication     takes lisinopril for kidneys  . Arthritis   . S/P knee replacement left 2011, right 2012    Past Surgical History  Procedure Laterality Date  . Joint replacement    . Back surgery  March 23, 2011    lower back   . Total hip arthroplasty  11/02/2011    Procedure: TOTAL HIP ARTHROPLASTY;  Surgeon: Gearlean Alf, MD;  Location: [facility];  Service: Orthopedics;  Laterality: Left;  . Total hip arthroplasty Right 02/24/2012    Procedure: TOTAL HIP ARTHROPLASTY;  Surgeon: Gearlean Alf, MD;  Location: [facility];  Service: Orthopedics;  Laterality: Right;    History reviewed. No pertinent family history. Social History:  reports that he quit smoking about 35 years ago. His smoking use included Cigarettes. He has a 20 pack-year smoking history. He quit smokeless tobacco use about 2 years ago. He reports that he does not drink alcohol or use illicit drugs.  Allergies:  Allergies  Allergen Reactions  . Morphine And Related Nausea And Vomiting    Medications Prior to Admission  Medication Sig Dispense Refill  . docusate (COLACE) 60 MG/15ML syrup  Take 60 mg by mouth daily as needed. For constipation    . ferrous sulfate 325 (65 FE) MG tablet Take 325 mg by mouth daily with breakfast.    . ibuprofen (ADVIL,MOTRIN) 600 MG tablet Take 1 tablet (600 mg total) by mouth every 8 (eight) hours as needed for pain. 90 tablet 0  . insulin NPH-insulin regular (NOVOLIN 70/30) (70-30) 100 UNIT/ML injection Inject 15 Units into the skin 2 (two) times daily with a meal.    . lisinopril (PRINIVIL,ZESTRIL) 2.5 MG tablet Take 2.5 mg by mouth every morning.    . methocarbamol (ROBAXIN) 500 MG tablet Take 1 tablet (500 mg total) by mouth every 6 (six) hours as needed. 80 tablet 1  . oxyCODONE (OXY IR/ROXICODONE) 5 MG immediate release tablet Take 1-2 tablets (5-10 mg total) by mouth every 3 (three) hours as needed. 80 tablet 0  . RELION INSULIN SYR 0.5CC/30G 30G X 5/16" 0.5 ML MISC USE AS DIRECTED 300 each 5  . rivaroxaban (XARELTO) 10 MG TABS tablet Take 1 tablet (10 mg total) by mouth daily with breakfast. 18 tablet 0  . traMADol (ULTRAM) 50 MG tablet Take 1-2 tablets (50-100 mg total) by mouth every 6 (six) hours as needed. 80 tablet 0     Review of Systems  Constitutional: Positive for chills and malaise/fatigue. Negative for fever, weight loss and diaphoresis.  HENT:  Negative.   Eyes: Negative.   Respiratory: Negative.   Cardiovascular: Negative.   Gastrointestinal: Negative.   Genitourinary: Negative.   Musculoskeletal: Positive for joint pain. Negative for myalgias, back pain, falls and neck pain.  Skin: Negative.   Neurological: Negative.  Negative for weakness.  Endo/Heme/Allergies: Negative for environmental allergies and polydipsia. Bruises/bleeds easily.  Psychiatric/Behavioral: Negative.    Temp 98.2 F  Physical Exam  Constitutional: He is oriented to person, place, and time. He appears well-developed and well-nourished. No distress.  HENT:  Head: Normocephalic and atraumatic.  Right Ear: External ear normal.  Left Ear: External ear  normal.  Nose: Nose normal.  Mouth/Throat: Oropharynx is clear and moist.  Eyes: Conjunctivae and EOM are normal.  Neck: Normal range of motion. Neck supple.  Cardiovascular: Normal rate, regular rhythm, normal heart sounds and intact distal pulses.   No murmur heard. Respiratory: Effort normal and breath sounds normal. No respiratory distress. He has no wheezes.  GI: Soft. Bowel sounds are normal.  Musculoskeletal:       Right hip: Normal.       Left hip: Normal.       Right knee: Normal.       Left knee: He exhibits decreased range of motion, swelling, effusion and erythema. Tenderness found.  Left knee significantly erythematous with small opening at central portion of incision from left TKA with copious bloody drainage and pus. Area on mid portion of anterior tibia with healing eschar and erythema surrounding it. No active bleeding from that area.   Neurological: He is alert and oriented to person, place, and time. He has normal strength and normal reflexes. No sensory deficit.  Skin: No rash noted. He is not diaphoretic. No erythema.  Psychiatric: He has a normal mood and affect. His behavior is normal.     Assessment/Plan Prepatellar bursa abscess vs left total knee infection We are going to admit him to [facility] for evaluation and likely IV antibiotics. Discussed with patient and his son that this is possibly just a superficial infection that can be treated with antibiotics alone. If it becomes apparent that the infection is within the joint, surgical intervention will likely be required. His knee was cleaned and redressed prior to his transport to [facility] for admission. Cultures were obtained in the office for anaerobic, aerobic, and gram stain.   CONSTABLE, AMBER LAUREN 07/28/2014, 4:33 PM   I have seen and examined the patient.This appears to be an infected pre-patellar bursa. Will give IV antibiotics tonight and reassess in AM for need for possible surgery tomorrow

## 2014-07-29 ENCOUNTER — Inpatient Hospital Stay (HOSPITAL_COMMUNITY): Payer: Medicare Other | Admitting: Anesthesiology

## 2014-07-29 ENCOUNTER — Other Ambulatory Visit: Payer: Self-pay

## 2014-07-29 ENCOUNTER — Encounter (HOSPITAL_COMMUNITY)
Admission: AD | Disposition: A | Discharge status: Home / Self Care | Payer: Self-pay | Source: Ambulatory Visit | Attending: Orthopedic Surgery

## 2014-07-29 HISTORY — PX: IRRIGATION AND DEBRIDEMENT KNEE: SHX5185

## 2014-07-29 LAB — COMPREHENSIVE METABOLIC PANEL
ALT: 9 U/L — ABNORMAL LOW (ref 17–63)
AST: 13 U/L — ABNORMAL LOW (ref 15–41)
Albumin: 3 g/dL — ABNORMAL LOW (ref 3.5–5.0)
Alkaline Phosphatase: 59 U/L (ref 38–126)
Anion gap: 5 (ref 5–15)
BUN: 34 mg/dL — ABNORMAL HIGH (ref 6–20)
CO2: 24 mmol/L (ref 22–32)
Calcium: 8.5 mg/dL — ABNORMAL LOW (ref 8.9–10.3)
Chloride: 104 mmol/L (ref 101–111)
Creatinine, Ser: 1.29 mg/dL — ABNORMAL HIGH (ref 0.61–1.24)
GFR calc Af Amer: 59 mL/min — ABNORMAL LOW (ref >60)
GFR calc non Af Amer: 51 mL/min — ABNORMAL LOW (ref >60)
Glucose, Bld: 256 mg/dL — ABNORMAL HIGH (ref 65–99)
Potassium: 4.7 mmol/L (ref 3.5–5.1)
Sodium: 133 mmol/L — ABNORMAL LOW (ref 135–145)
Total Bilirubin: 0.9 mg/dL (ref 0.3–1.2)
Total Protein: 6.9 g/dL (ref 6.5–8.1)

## 2014-07-29 LAB — GLUCOSE, CAPILLARY
GLUCOSE-CAPILLARY: 140 mg/dL — AB (ref 65–99)
Glucose-Capillary: 222 mg/dL — ABNORMAL HIGH (ref 65–99)
Glucose-Capillary: 224 mg/dL — ABNORMAL HIGH (ref 65–99)
Glucose-Capillary: 196 mg/dL — ABNORMAL HIGH (ref 65–99)

## 2014-07-29 LAB — CBC
HCT: 33.3 % — ABNORMAL LOW (ref 39.0–52.0)
Hemoglobin: 10.8 g/dL — ABNORMAL LOW (ref 13.0–17.0)
MCH: 32.3 pg (ref 26.0–34.0)
MCHC: 32.4 g/dL (ref 30.0–36.0)
MCV: 99.7 fL (ref 78.0–100.0)
Platelets: 254 10*3/uL (ref 150–400)
RBC: 3.34 MIL/uL — ABNORMAL LOW (ref 4.22–5.81)
RDW: 12.4 % (ref 11.5–15.5)
WBC: 8.8 10*3/uL (ref 4.0–10.5)

## 2014-07-29 SURGERY — IRRIGATION AND DEBRIDEMENT KNEE
Anesthesia: General | Site: Knee | Laterality: Left

## 2014-07-29 MED ORDER — GLYCOPYRROLATE 0.2 MG/ML IJ SOLN
INTRAMUSCULAR | Status: AC
  Filled 2014-07-29: qty 2

## 2014-07-29 MED ORDER — INSULIN ASPART 100 UNIT/ML ~~LOC~~ SOLN
0.0000 [IU] | Freq: Three times a day (TID) | SUBCUTANEOUS | Status: DC
  Administered 2014-07-30: 5 [IU] via SUBCUTANEOUS
  Administered 2014-07-31 – 2014-08-01 (×2): 3 [IU] via SUBCUTANEOUS

## 2014-07-29 MED ORDER — PROPOFOL 10 MG/ML IV BOLUS
INTRAVENOUS | Status: DC | PRN
  Administered 2014-07-29: 150 mg via INTRAVENOUS

## 2014-07-29 MED ORDER — ACETAMINOPHEN 10 MG/ML IV SOLN
1000.0000 mg | Freq: Once | INTRAVENOUS | Status: AC
  Administered 2014-07-29: 1000 mg via INTRAVENOUS
  Filled 2014-07-29: qty 100

## 2014-07-29 MED ORDER — FENTANYL CITRATE (PF) 100 MCG/2ML IJ SOLN
25.0000 ug | INTRAMUSCULAR | Status: DC | PRN
  Administered 2014-07-29 (×4): 25 ug via INTRAVENOUS

## 2014-07-29 MED ORDER — DORZOLAMIDE HCL-TIMOLOL MAL 2-0.5 % OP SOLN
1.0000 [drp] | Freq: Two times a day (BID) | OPHTHALMIC | Status: DC
  Administered 2014-07-29 – 2014-08-01 (×6): 1 [drp] via OPHTHALMIC
  Filled 2014-07-29: qty 10

## 2014-07-29 MED ORDER — SODIUM CHLORIDE 0.9 % IR SOLN
Status: DC | PRN
  Administered 2014-07-29: 3000 mL

## 2014-07-29 MED ORDER — ACETAMINOPHEN 10 MG/ML IV SOLN
INTRAVENOUS | Status: AC
  Filled 2014-07-29: qty 100

## 2014-07-29 MED ORDER — EPHEDRINE SULFATE 50 MG/ML IJ SOLN
INTRAMUSCULAR | Status: DC | PRN
  Administered 2014-07-29: 10 mg via INTRAVENOUS

## 2014-07-29 MED ORDER — PROPOFOL 10 MG/ML IV BOLUS
INTRAVENOUS | Status: AC
  Filled 2014-07-29: qty 20

## 2014-07-29 MED ORDER — ACETAMINOPHEN 325 MG PO TABS: 650.0000 mg | ORAL_TABLET | Freq: Four times a day (QID) | ORAL | Status: DC | PRN

## 2014-07-29 MED ORDER — HYDROMORPHONE HCL 1 MG/ML IJ SOLN
0.5000 mg | INTRAMUSCULAR | Status: DC | PRN
Start: 2014-07-29 — End: 2014-08-01

## 2014-07-29 MED ORDER — LACTATED RINGERS IV SOLN
INTRAVENOUS | Status: DC
  Administered 2014-07-29: 1000 mL via INTRAVENOUS

## 2014-07-29 MED ORDER — ONDANSETRON HCL 4 MG/2ML IJ SOLN: 4.0000 mg | Freq: Four times a day (QID) | INTRAMUSCULAR | Status: DC | PRN

## 2014-07-29 MED ORDER — ASPIRIN 81 MG PO CHEW
81.0000 mg | CHEWABLE_TABLET | Freq: Every day | ORAL | Status: DC
  Administered 2014-07-30 – 2014-08-01 (×3): 81 mg via ORAL
  Filled 2014-07-29 (×3): qty 1

## 2014-07-29 MED ORDER — METOCLOPRAMIDE HCL 10 MG PO TABS: 5.0000 mg | ORAL_TABLET | Freq: Three times a day (TID) | ORAL | Status: DC | PRN

## 2014-07-29 MED ORDER — TRAMADOL HCL 50 MG PO TABS: 50.0000 mg | ORAL_TABLET | Freq: Four times a day (QID) | ORAL | Status: DC | PRN

## 2014-07-29 MED ORDER — DEXTROSE 5 % IV SOLN
500.0000 mg | Freq: Four times a day (QID) | INTRAVENOUS | Status: DC | PRN
  Filled 2014-07-29: qty 5

## 2014-07-29 MED ORDER — FENTANYL CITRATE (PF) 100 MCG/2ML IJ SOLN: 25.0000 ug | INTRAMUSCULAR | Status: DC | PRN

## 2014-07-29 MED ORDER — METHOCARBAMOL 500 MG PO TABS: 500.0000 mg | ORAL_TABLET | Freq: Four times a day (QID) | ORAL | Status: DC | PRN

## 2014-07-29 MED ORDER — INSULIN ASPART PROT & ASPART (70-30 MIX) 100 UNIT/ML ~~LOC~~ SUSP
15.0000 [IU] | Freq: Two times a day (BID) | SUBCUTANEOUS | Status: DC
  Administered 2014-07-30 – 2014-08-01 (×5): 15 [IU] via SUBCUTANEOUS
  Filled 2014-07-29: qty 10

## 2014-07-29 MED ORDER — ONDANSETRON HCL 4 MG/2ML IJ SOLN
INTRAMUSCULAR | Status: DC | PRN
  Administered 2014-07-29: 4 mg via INTRAVENOUS

## 2014-07-29 MED ORDER — ONDANSETRON HCL 4 MG PO TABS: 4.0000 mg | ORAL_TABLET | Freq: Four times a day (QID) | ORAL | Status: DC | PRN

## 2014-07-29 MED ORDER — FENTANYL CITRATE (PF) 100 MCG/2ML IJ SOLN
INTRAMUSCULAR | Status: AC
  Filled 2014-07-29: qty 2

## 2014-07-29 MED ORDER — GLYCOPYRROLATE 0.2 MG/ML IJ SOLN
INTRAMUSCULAR | Status: DC | PRN
  Administered 2014-07-29: 0.2 mg via INTRAVENOUS

## 2014-07-29 MED ORDER — ACETAMINOPHEN 650 MG RE SUPP: 650.0000 mg | Freq: Four times a day (QID) | RECTAL | Status: DC | PRN

## 2014-07-29 MED ORDER — FENTANYL CITRATE (PF) 250 MCG/5ML IJ SOLN
INTRAMUSCULAR | Status: DC | PRN
  Administered 2014-07-29 (×4): 25 ug via INTRAVENOUS

## 2014-07-29 MED ORDER — ASPIRIN 81 MG PO TABS: 81.0000 mg | ORAL_TABLET | Freq: Every day | ORAL | Status: DC

## 2014-07-29 MED ORDER — METOCLOPRAMIDE HCL 5 MG/ML IJ SOLN: 5.0000 mg | Freq: Three times a day (TID) | INTRAMUSCULAR | Status: DC | PRN

## 2014-07-29 SURGICAL SUPPLY — 41 items
BAG ZIPLOCK 12X15 (MISCELLANEOUS) ×3 IMPLANT
BANDAGE ELASTIC 6 VELCRO ST LF (GAUZE/BANDAGES/DRESSINGS) ×3 IMPLANT
BANDAGE ESMARK 6X9 LF (GAUZE/BANDAGES/DRESSINGS) ×1 IMPLANT
BNDG ESMARK 6X9 LF (GAUZE/BANDAGES/DRESSINGS) ×3
CLOSURE WOUND 1/2 X4 (GAUZE/BANDAGES/DRESSINGS) ×1
CUFF TOURN SGL QUICK 34 (TOURNIQUET CUFF) ×2
CUFF TRNQT CYL 34X4X40X1 (TOURNIQUET CUFF) ×1 IMPLANT
DRAPE EXTREMITY T 121X128X90 (DRAPE) ×3 IMPLANT
DRAPE POUCH INSTRU U-SHP 10X18 (DRAPES) ×3 IMPLANT
DRAPE U-SHAPE 47X51 STRL (DRAPES) ×3 IMPLANT
DRSG ADAPTIC 3X8 NADH LF (GAUZE/BANDAGES/DRESSINGS) IMPLANT
DRSG PAD ABDOMINAL 8X10 ST (GAUZE/BANDAGES/DRESSINGS) IMPLANT
DURAPREP 26ML APPLICATOR (WOUND CARE) ×3 IMPLANT
ELECT REM PT RETURN 9FT ADLT (ELECTROSURGICAL) ×3
ELECTRODE REM PT RTRN 9FT ADLT (ELECTROSURGICAL) ×1 IMPLANT
EVACUATOR 1/8 PVC DRAIN (DRAIN) IMPLANT
GAUZE SPONGE 4X4 12PLY STRL (GAUZE/BANDAGES/DRESSINGS) ×3 IMPLANT
GLOVE BIO SURGEON STRL SZ7.5 (GLOVE) IMPLANT
GLOVE BIO SURGEON STRL SZ8 (GLOVE) ×3 IMPLANT
GLOVE BIOGEL PI IND STRL 8 (GLOVE) ×1 IMPLANT
GLOVE BIOGEL PI INDICATOR 8 (GLOVE) ×2
GLOVE ECLIPSE 8.0 STRL XLNG CF (GLOVE) ×3 IMPLANT
GOWN STRL REUS W/TWL LRG LVL3 (GOWN DISPOSABLE) ×3 IMPLANT
GOWN STRL REUS W/TWL XL LVL3 (GOWN DISPOSABLE) ×6 IMPLANT
HANDPIECE INTERPULSE COAX TIP (DISPOSABLE) ×2
KIT BASIN OR (CUSTOM PROCEDURE TRAY) ×3 IMPLANT
MANIFOLD NEPTUNE II (INSTRUMENTS) ×3 IMPLANT
PACK TOTAL JOINT (CUSTOM PROCEDURE TRAY) ×3 IMPLANT
PAD ABD 8X10 STRL (GAUZE/BANDAGES/DRESSINGS) ×3 IMPLANT
PADDING CAST COTTON 6X4 STRL (CAST SUPPLIES) ×3 IMPLANT
POSITIONER SURGICAL ARM (MISCELLANEOUS) ×3 IMPLANT
SET HNDPC FAN SPRY TIP SCT (DISPOSABLE) ×1 IMPLANT
STAPLER VISISTAT 35W (STAPLE) ×3 IMPLANT
STRIP CLOSURE SKIN 1/2X4 (GAUZE/BANDAGES/DRESSINGS) ×2 IMPLANT
SUT MNCRL AB 4-0 PS2 18 (SUTURE) ×3 IMPLANT
SUT VIC AB 2-0 CT1 27 (SUTURE) ×6
SUT VIC AB 2-0 CT1 TAPERPNT 27 (SUTURE) ×3 IMPLANT
SUT VLOC 180 0 24IN GS25 (SUTURE) ×3 IMPLANT
SWAB COLLECTION DEVICE MRSA (MISCELLANEOUS) ×3 IMPLANT
TOWEL OR 17X26 10 PK STRL BLUE (TOWEL DISPOSABLE) ×6 IMPLANT
TUBE ANAEROBIC SPECIMEN COL (MISCELLANEOUS) ×3 IMPLANT

## 2014-07-29 NOTE — Anesthesia Postprocedure Evaluation (Signed)
  Anesthesia Post-op Note  Patient: [name]  Procedure(s) Performed: Procedure(s): IRRIGATION AND DEBRIDEMENT WITH BURSECTOMY OF LEFT  KNEE  (Left)  Patient Location: PACU  Anesthesia Type:General  Level of Consciousness: awake  Airway and Oxygen Therapy: Patient Spontanous Breathing  Post-op Pain: mild  Post-op Assessment: Post-op Vital signs reviewed LLE Motor Response: Purposeful movement LLE Sensation:  (neuropathy) RLE Motor Response: Purposeful movement RLE Sensation: Other (Comment) (neuropathy bi lat)      Post-op Vital Signs: Reviewed  Last Vitals:  Filed Vitals:   07/29/14 1745  BP: 123/95  Pulse: 62  Temp:   Resp: 14    Complications: No apparent anesthesia complications

## 2014-07-29 NOTE — Anesthesia Procedure Notes (Signed)
Procedure Name: LMA Insertion Date/Time: 07/29/2014 4:23 PM Performed by: Dione Booze Pre-anesthesia Checklist: Patient identified, Emergency Drugs available, Suction available and Patient being monitored Patient Re-evaluated:Patient Re-evaluated prior to inductionOxygen Delivery Method: Circle system utilized Preoxygenation: Pre-oxygenation with 100% oxygen Intubation Type: IV induction LMA: LMA inserted LMA Size: 4.0 Number of attempts: 1 Placement Confirmation: positive ETCO2 Tube secured with: Tape Dental Injury: Teeth and Oropharynx as per pre-operative assessment

## 2014-07-29 NOTE — Progress Notes (Signed)
[facility]  Patient Status:   New pt for [facility] this admission  [facility] is providing the following services:   HHRN, PT (if ordered)  And [facility] for home IV ABX. [facility] hospital team will follow pt and support DC home when deemed appropriate.  If patient discharges after hours, please call [phone].   Larry Sierras 07/29/2014, 11:31 PM

## 2014-07-29 NOTE — Progress Notes (Signed)
Inpatient Diabetes Program Recommendations  AACE/ADA: New Consensus Statement on Inpatient Glycemic Control (2013)  Target Ranges:  Prepandial:   less than 140 mg/dL      Peak postprandial:   less than 180 mg/dL (1-2 hours)      Critically ill patients:  140 - 180 mg/dL   Reason for Visit: Hyperglycemia  Diabetes history: DM2 Outpatient Diabetes medications: 70/30 15 units bid Current orders for Inpatient glycemic control: Novolog moderate tidwc and hs  Results for [name], [name] (MRN [id number]) as of 07/29/2014 15:18  Ref. Range 07/28/2014 16:44 07/28/2014 21:36 07/29/2014 07:05 07/29/2014 12:11  Glucose-Capillary Latest Ref Range: 65-99 mg/dL 244 (H) 248 (H) 222 (H) 224 (H)   To OR - will need part of 70/30 insulin - 10 units bid. Discussed with RN.   Thank you. Lorenda Peck, RD, LDN, CDE Inpatient Diabetes Coordinator [phone]

## 2014-07-29 NOTE — Brief Op Note (Signed)
07/28/2014 - 07/29/2014  5:12 PM  PATIENT:  [name]  81 y.o. male  PRE-OPERATIVE DIAGNOSIS:  LEFT INFECTED PRE-PATELLA BURSA  POST-OPERATIVE DIAGNOSIS:  LEFT INFECTED PRE-PATELLA BURSA  PROCEDURE:  Procedure(s): IRRIGATION AND DEBRIDEMENT WITH BURSECTOMY OF LEFT  KNEE  (Left)  SURGEON:  Surgeon(s) and Role:    * Gaynelle Arabian, MD - Primary  PHYSICIAN ASSISTANT:   ASSISTANTS: none   ANESTHESIA:   general  EBL:  Total I/O In: 5374 [P.O.:240; I.V.:800] Out: 475 [Urine:475]  BLOOD ADMINISTERED:none  DRAINS: (Medium) Hemovact drain(s) in the left knee subcutaneous tissues with  Suction Open   LOCAL MEDICATIONS USED:  NONE  COUNTS:  YES  TOURNIQUET:   Total Tourniquet Time Documented: Thigh (Left) - 25 minutes Total: Thigh (Left) - 25 minutes   DICTATION: .Other Dictation: Dictation Number [id number]  PLAN OF CARE: Admit to inpatient   PATIENT DISPOSITION:  PACU - hemodynamically stable.

## 2014-07-29 NOTE — Progress Notes (Signed)
   Subjective: Hospital day - 2 Patient reports pain as mild.   Patient seen in rounds with Dr. Wynelle Link. Patient is well, but has had some minor complaints of pain in the knee, requiring pain medications Admitted to the hospital yesterday from the office for a draining wound over the left total knee. He gave a history of working in the yard and apparently cut his leg on a fence.  It worsened over the next several days and developed drainage from the area on the morning of office visit and subsequent admission.  Objective: Vital signs in last 24 hours: Temp:  [98 F (36.7 C)-99.4 F (37.4 C)] 98.7 F (37.1 C) (07/19 3567) Pulse Rate:  [61-68] 68 (07/19 0613) Resp:  [16] 16 (07/19 0613) BP: (130-134)/(49-70) 130/70 mmHg (07/19 0613) SpO2:  [98 %-99 %] 99 % (07/19 0613) Weight:  [89.812 kg (198 lb)] 89.812 kg (198 lb) (07/18 1856)  Intake/Output from previous day:  Intake/Output Summary (Last 24 hours) at 07/29/14 0918 Last data filed at 07/29/14 0730  Gross per 24 hour  Intake   1205 ml  Output   1800 ml  Net   -595 ml    Intake/Output this shift: Total I/O In: 240 [P.O.:240] Out: -   Labs:  Recent Labs  07/29/14 0408  HGB 10.8*    Recent Labs  07/29/14 0408  WBC 8.8  RBC 3.34*  HCT 33.3*  PLT 254    Recent Labs  07/28/14 1830 07/29/14 0408  NA  --  133*  K  --  4.7  CL  --  104  CO2  --  24  BUN  --  34*  CREATININE 1.61* 1.29*  GLUCOSE  --  256*  CALCIUM  --  8.5*   No results for input(s): LABPT, INR in the last 72 hours.  EXAM General - Patient is Alert, Appropriate and Oriented Extremity - open wound approx 1 cm in length over the left patellar region. Wound - scant drainage bloody in nature Motor Function - intact, moving foot and toes well on exam.   Past Medical History  Diagnosis Date  . Diabetes mellitus without complication     takes lisinopril for kidneys  . Arthritis   . S/P knee replacement left 2011, right 2012     Assessment/Plan: Hospital day - 2 Active Problems:   Prepatellar abscess, left knee  Estimated body mass index is 29.23 kg/(m^2) as calculated from the following:   Height as of this encounter: 5\' 9"  (1.753 m).   Weight as of this encounter: 89.812 kg (198 lb). Continue ABX therapy due to prepatellar infection Plan for surgical debridement later today as add on. NPO at this time. Surgical consent ordered. Already receiving antibiotics via IV Vancomycin.  Arlee Muslim, PA-C Orthopaedic Surgery 07/29/2014, 9:18 AM

## 2014-07-29 NOTE — Progress Notes (Signed)
Utilization review completed.  

## 2014-07-29 NOTE — Care Management Note (Signed)
Case Management Note  Patient Details  Name: [name] MRN: [id number] Date of Birth: 07-Oct-1932  Subjective/Objective:                   LEFT INFECTED PRE-PATELLA BURSA Action/Plan:  Discharge planning Expected Discharge Date:   (unknown)               Expected Discharge Plan:  [place]  In-House Referral:     Discharge planning Services  CM Consult  Post Acute Care Choice:    Choice offered to:     DME Arranged:    DME Agency:     HH Arranged:    [place] Agency:  [facility]  Status of Service:  In process, will continue to follow  Medicare Important Message Given:    Date Medicare IM Given:    Medicare IM give by:    Date Additional Medicare IM Given:    Additional Medicare Important Message give by:     If discussed at [place] of Stay Meetings, dates discussed:    Additional Comments: CM contacted [facility] rep Carolynn Sayers in anticipation of IV ABX.  Will continue to monitor progress. Dellie Catholic, RN 07/29/2014, 3:45 PM

## 2014-07-29 NOTE — Anesthesia Preprocedure Evaluation (Addendum)
Anesthesia Evaluation  Patient identified by MRN, date of birth, ID band Patient awake  General Assessment Comment:History noted. CE  Reviewed: Allergy & Precautions, NPO status , Patient's Chart, lab work & pertinent test results  Airway Mallampati: II  TM Distance: >3 FB Neck ROM: Full    Dental   Pulmonary former smoker,  breath sounds clear to auscultation        Cardiovascular Rhythm:Regular Rate:Normal     Neuro/Psych    GI/Hepatic negative GI ROS, Neg liver ROS,   Endo/Other  diabetes  Renal/GU negative Renal ROS     Musculoskeletal   Abdominal   Peds  Hematology   Anesthesia Other Findings   Reproductive/Obstetrics                            Anesthesia Physical Anesthesia Plan  ASA: III  Anesthesia Plan: General   Post-op Pain Management:    Induction: Intravenous  Airway Management Planned: LMA  Additional Equipment:   Intra-op Plan:   Post-operative Plan: Extubation in OR  Informed Consent: I have reviewed the patients History and Physical, chart, labs and discussed the procedure including the risks, benefits and alternatives for the proposed anesthesia with the patient or authorized representative who has indicated his/her understanding and acceptance.   Dental advisory given  Plan Discussed with: CRNA and Anesthesiologist  Anesthesia Plan Comments:         Anesthesia Quick Evaluation

## 2014-07-29 NOTE — Transfer of Care (Signed)
Immediate Anesthesia Transfer of Care Note  Patient: [name]  Procedure(s) Performed: Procedure(s): IRRIGATION AND DEBRIDEMENT WITH BURSECTOMY OF LEFT  KNEE  (Left)  Patient Location: PACU  Anesthesia Type:General  Level of Consciousness: awake, alert , oriented and patient cooperative  Airway & Oxygen Therapy: Patient Spontanous Breathing and Patient connected to face mask oxygen  Post-op Assessment: Report given to RN and Post -op Vital signs reviewed and stable  Post vital signs: Reviewed and stable  Last Vitals:  Filed Vitals:   07/29/14 0613  BP: 130/70  Pulse: 68  Temp: 37.1 C  Resp: 16    Complications: No apparent anesthesia complications

## 2014-07-29 NOTE — Interval H&P Note (Signed)
History and Physical Interval Note:  07/29/2014 3:39 PM  [name]  has presented today for surgery, with the diagnosis of LEFT INFECTED PRE-PATELLA BURSA  The various methods of treatment have been discussed with the patient and family. After consideration of risks, benefits and other options for treatment, the patient has consented to  Procedure(s): IRRIGATION AND DEBRIDEMENT LEFT  KNEE BURSA (Left) as a surgical intervention .  The patient's history has been reviewed, patient examined, no change in status, stable for surgery.  I have reviewed the patient's chart and labs.  Questions were answered to the patient's satisfaction.     Gearlean Alf

## 2014-07-30 ENCOUNTER — Encounter (HOSPITAL_COMMUNITY): Payer: Self-pay | Admitting: Orthopedic Surgery

## 2014-07-30 LAB — GLUCOSE, CAPILLARY
GLUCOSE-CAPILLARY: 239 mg/dL — AB (ref 65–99)
GLUCOSE-CAPILLARY: 239 mg/dL — AB (ref 65–99)
GLUCOSE-CAPILLARY: 108 mg/dL — AB (ref 65–99)
Glucose-Capillary: 149 mg/dL — ABNORMAL HIGH (ref 65–99)
Glucose-Capillary: 121 mg/dL — ABNORMAL HIGH (ref 65–99)

## 2014-07-30 NOTE — Progress Notes (Signed)
   Subjective: 1 Day Post-Op Procedure(s) (LRB): IRRIGATION AND DEBRIDEMENT WITH BURSECTOMY OF LEFT  KNEE  (Left) Patient reports pain as mild.   Patient seen in rounds for Dr. Wynelle Link. Patient is well, but has had some minor complaints of pain in the knee, requiring pain medications  Objective: Vital signs in last 24 hours: Temp:  [97.6 F (36.4 C)-98.2 F (36.8 C)] 98 F (36.7 C) (07/20 0536) Pulse Rate:  [50-64] 61 (07/20 0536) Resp:  [12-19] 12 (07/20 0536) BP: (115-143)/(51-95) 130/58 mmHg (07/20 0536) SpO2:  [99 %-100 %] 100 % (07/20 0536)  Intake/Output from previous day: 07/19 0701 - 07/20 0700 In: 3767.5 [P.O.:900; I.V.:2867.5] Out: 1650 [Urine:1650] Intake/Output this shift: Total I/O In: 240 [P.O.:240] Out: -    Recent Labs  07/29/14 0408  HGB 10.8*    Recent Labs  07/29/14 0408  WBC 8.8  RBC 3.34*  HCT 33.3*  PLT 254    Recent Labs  07/28/14 1830 07/29/14 0408  NA  --  133*  K  --  4.7  CL  --  104  CO2  --  24  BUN  --  34*  CREATININE 1.61* 1.29*  GLUCOSE  --  256*  CALCIUM  --  8.5*   No results for input(s): LABPT, INR in the last 72 hours.  EXAM General - Patient is Alert, Appropriate and Oriented Extremity - Neurovascular intact Sensation intact distally Dorsiflexion/Plantar flexion intact Dressing - dressing C/D/I Motor Function - intact, moving foot and toes well on exam.  Hemovac in place  Past Medical History  Diagnosis Date  . Diabetes mellitus without complication     takes lisinopril for kidneys  . Arthritis   . S/P knee replacement left 2011, right 2012    Assessment/Plan: 1 Day Post-Op Procedure(s) (LRB): IRRIGATION AND DEBRIDEMENT WITH BURSECTOMY OF LEFT  KNEE  (Left) Active Problems:   Prepatellar abscess, left knee  Estimated body mass index is 29.23 kg/(m^2) as calculated from the following:   Height as of this encounter: 5\' 9"  (1.753 m).   Weight as of this encounter: 89.812 kg (198 lb). Continue ABX  therapy due to infection  DVT Prophylaxis - Aspirin Weight-Bearing as tolerated to left leg  Arlee Muslim, PA-C Orthopaedic Surgery 07/30/2014, 10:57 AM

## 2014-07-30 NOTE — Care Management Important Message (Signed)
Important Message  Patient Details  Name: JASANI DOLNEY MRN: [id number] Date of Birth: 02-20-1932   Medicare Important Message Given:  Yes-second notification given    Camillo Flaming 07/30/2014, 12:34 [place] Message  Patient Details  Name: ISSIAH HUFFAKER MRN: [id number] Date of Birth: February 17, 1932   Medicare Important Message Given:  Yes-second notification given    Camillo Flaming 07/30/2014, 12:33 PM

## 2014-07-30 NOTE — Op Note (Signed)
NAMEALGER, KERSTEIN NO.:  [id number]  MEDICAL RECORD NO.:  [id number]  LOCATION:  5053                         FACILITY:  [facility]  PHYSICIAN:  Gaynelle Arabian, M.D.    DATE OF BIRTH:  17-Oct-1932  DATE OF PROCEDURE:  07/29/2014 DATE OF DISCHARGE:                              OPERATIVE REPORT   PREOPERATIVE DIAGNOSIS:  Infected left prepatellar bursa.  POSTOPERATIVE DIAGNOSIS:  Infected left prepatellar bursa.  PROCEDURE:  Irrigation and debridement of left knee with prepatellar bursectomy.  SURGEON:  Gaynelle Arabian, M.D.  ASSISTANT:  No assistant.  ANESTHESIA:  General.  ESTIMATED BLOOD LOSS:  Minimal.  DRAINS:  Hemovac x1.  COMPLICATIONS:  None.  TOURNIQUET TIME:  26 minutes at 300 mmHg.  CONDITION:  Stable to recovery.  BRIEF CLINICAL NOTE:  Mr. [name] is an 81 year old male who has had multiple joint replacements including bilateral total knee and total hips and last week cut his left knee while fixing a lawnmower. Yesterday, he presents to our office with purulent drainage from an open wound on the knee.  He is admitted for IV antibiotics and taken to the operating room tonight for irrigation and debridement.  It appears that the wound was isolated to the bursa.  PROCEDURE IN DETAIL:  After successful administration of general anesthetic, a tourniquet was placed on the left thigh left lower extremity, prepped and draped in usual sterile fashion.  He had about 1 x 1 cm opening at central aspect of the knee over the patella.  There was pus draining from this.  Longitudinal incision was made, and I ellipsed out the open area.  The skin was cut with 10 blade through subcutaneous tissue.  There was necrotic tissue in this subcu.  I used a knife to sharply debride away all of the abnormal subcutaneous tissue. The superficial fascia and retinaculum overlying the knee is excised. There is almost saddened appearance of a necrotizing fasciitis,  but fortunately it was contained.  I dissected back into normal appearing tissue and it did not extend beyond the prepatellar bursal area.  I removed all the prepatellar bursal tissue.  When we finally had gotten to healthy-appearing tissue, I then irrigated the wound with 3 L of saline using pulsatile lavage.  We then closed the subcutaneous tissue over Hemovac drain with interrupted 2-0 Vicryl.  The skin was closed with staples.  The tourniquet was released with total time of 26 minutes.  A bulky sterile dressing is applied and he was awakened and transported to recovery in stable condition.  Note that the purulent fluid at the beginning of the case was sent for Gram stain, culture, sensitivity, aerobic and anaerobic cultures.     Gaynelle Arabian, M.D.     FA/MEDQ  D:  07/29/2014  T:  07/29/2014  Job:  [id number]

## 2014-07-31 LAB — VANCOMYCIN, TROUGH: Vancomycin Tr: 9 ug/mL — ABNORMAL LOW (ref 10.0–20.0)

## 2014-07-31 LAB — CREATININE, SERUM
Creatinine, Ser: 1.14 mg/dL (ref 0.61–1.24)
GFR calc non Af Amer: 59 mL/min — ABNORMAL LOW (ref >60)

## 2014-07-31 LAB — GLUCOSE, CAPILLARY
GLUCOSE-CAPILLARY: 109 mg/dL — AB (ref 65–99)
GLUCOSE-CAPILLARY: 181 mg/dL — AB (ref 65–99)
GLUCOSE-CAPILLARY: 70 mg/dL (ref 65–99)
Glucose-Capillary: 100 mg/dL — ABNORMAL HIGH (ref 65–99)

## 2014-07-31 MED ORDER — VANCOMYCIN HCL 10 G IV SOLR
2000.0000 mg | INTRAVENOUS | Status: DC
  Administered 2014-07-31: 2000 mg via INTRAVENOUS
  Filled 2014-07-31 (×2): qty 2000

## 2014-07-31 NOTE — Progress Notes (Signed)
ANTIBIOTIC CONSULT NOTE - Follow Up  Pharmacy Consult for Vancomycin Indication: Cellulitis, Prepatellar abscess of left knee  Allergies  Allergen Reactions  . Morphine And Related Nausea And Vomiting    Patient Measurements: Height: 5\' 9"  (175.3 cm) Weight: 198 lb (89.812 kg) IBW/kg (Calculated) : 70.7  Vital Signs: Temp: 98.3 F (36.8 C) (07/21 1001) Temp Source: Oral (07/21 1001) BP: 108/50 mmHg (07/21 1001) Pulse Rate: 55 (07/21 1001) Intake/Output from previous day: 07/20 0701 - 07/21 0700 In: 970 [P.O.:720; IV Piggyback:250] Out: 2525 [Urine:2525] Intake/Output from this shift: Total I/O In: 740 [P.O.:740] Out: 800 [Urine:800]  Labs:  Recent Labs  07/28/14 1830 07/29/14 0408 07/31/14 0438  WBC  --  8.8  --   HGB  --  10.8*  --   PLT  --  254  --   CREATININE 1.61* 1.29* 1.14   Estimated Creatinine Clearance: 57.2 mL/min (by C-G formula based on Cr of 1.14).  Recent Labs  07/31/14 1445  [place] 9*     Microbiology: Recent Results (from the past 720 hour(s))  Anaerobic culture     Status: None (Preliminary result)   Collection Time: 07/29/14  4:38 PM  Result Value Ref Range Status   Specimen Description WOUND  Final   Special Requests NONE  Final   Gram Stain   Final    ABUNDANT WBC PRESENT, PREDOMINANTLY PMN NO SQUAMOUS EPITHELIAL CELLS SEEN ABUNDANT GRAM POSITIVE COCCI IN PAIRS Performed at [facility]    Culture   Final    NO ANAEROBES ISOLATED; CULTURE IN PROGRESS FOR 5 DAYS Performed at [facility]    Report Status PENDING  Incomplete  Wound culture     Status: None (Preliminary result)   Collection Time: 07/29/14  4:38 PM  Result Value Ref Range Status   Specimen Description WOUND  Final   Special Requests NONE  Final   Gram Stain   Final    ABUNDANT WBC PRESENT, PREDOMINANTLY PMN NO SQUAMOUS EPITHELIAL CELLS SEEN ABUNDANT GRAM POSITIVE COCCI IN PAIRS Performed at [facility]    Culture   Final     MODERATE STAPHYLOCOCCUS AUREUS Note: RIFAMPIN AND GENTAMICIN SHOULD NOT BE USED AS SINGLE DRUGS FOR TREATMENT OF STAPH INFECTIONS. Performed at [facility]    Report Status PENDING  Incomplete    Medical History: Past Medical History  Diagnosis Date  . Diabetes mellitus without complication     takes lisinopril for kidneys  . Arthritis   . S/P knee replacement left 2011, right 2012    Medications:  Scheduled:  . aspirin  81 mg Oral Daily  . docusate sodium  100 mg Oral BID  . dorzolamide-timolol  1 drop Both Eyes BID  . ferrous sulfate  325 mg Oral Q breakfast  . insulin aspart  0-15 Units Subcutaneous TID WC  . insulin aspart protamine- aspart  15 Units Subcutaneous BID WC  . vancomycin  1,250 mg Intravenous Q24H   Infusions:  . sodium chloride 10 mL/hr at 07/31/14 0726   PRN: acetaminophen **OR** acetaminophen, alum & mag hydroxide-simeth, bisacodyl, HYDROmorphone (DILAUDID) injection, methocarbamol **OR** methocarbamol (ROBAXIN)  IV, metoCLOPramide **OR** metoCLOPramide (REGLAN) injection, ondansetron **OR** ondansetron (ZOFRAN) IV, oxyCODONE, polyethylene glycol, traMADol  Assessment: 81 yo male presents with knee pain and drainage s/p cutting it on his fence last week. Ortho consulted and suspected infected pre-patellar bursa.  Patient was started on IV antibiotics and is now s/p I&D with bursectomy of left knee (procedure did  reveal some necrotizing fascitis).  Pharmacy dosing vancomycin.  Noted plans for continued IV vancomycin at discharge.  Today, patient is afebrile, wbc wnl, Scr trending down 1.14 (crcl~57)   7/18 >> Vanc >>   7/19 intra-op wound: moderate staph aureus  7/19 anaerobic wound: GPC in pairs  Vancomycin trough = 9 (Has received 1g dose x 1 then 1250 mg q24h x 2 doses prior to collection of trough)  Goal of Therapy:  Vancomycin trough level 15-20 mcg/ml  Plan:  Increase Vancomycin to 2g IV q24h.  Would like to keep q24h dosing for  ease of administration after discharge.  Hershal Coria, PharmD, BCPS Pager: [phone] 07/31/2014 4:12 PM

## 2014-07-31 NOTE — Evaluation (Signed)
Physical Therapy Evaluation Patient Details Name: [name] MRN: [id number] DOB: 16-Jan-1932 Today's Date: 07/31/2014   History of Present Illness  S/p I&D with bursectomy L knee; s/p Bil TKR and Bil THR  Clinical Impression  Pt s/p L knee I&D presents with functional mobility limitations 2* post op pain and decreased L LE strength/ROM.  Pt should progress well to dc home with family assist.    Follow Up Recommendations No PT follow up    Equipment Recommendations  None recommended by PT    Recommendations for Other Services       Precautions / Restrictions Precautions Precautions: Fall Restrictions Weight Bearing Restrictions: No LLE Weight Bearing: Weight bearing as tolerated      Mobility  Bed Mobility Overal bed mobility: Modified Independent                Transfers Overall transfer level: Needs assistance Equipment used: Rolling walker (2 wheeled) Transfers: Sit to/from Stand Sit to Stand: Min assist         General transfer comment: cues for LE management and use of UEs to self assist  Ambulation/Gait Ambulation/Gait assistance: Min assist;Min guard Ambulation Distance (Feet): 135 Feet Assistive device: Rolling walker (2 wheeled) Gait Pattern/deviations: Step-to pattern;Step-through pattern;Shuffle;Trunk flexed     General Gait Details: cues for posture, position from RW and initial sequence  Stairs            Wheelchair Mobility    Modified Rankin (Stroke Patients Only)       Balance                                             Pertinent Vitals/Pain Pain Assessment: 0-10 Pain Score: 2  Pain Location: L knee Pain Descriptors / Indicators: Sore Pain Intervention(s): Limited activity within patient's tolerance;Monitored during session;Ice applied    Home Living Family/patient expects to be discharged to:: Private residence Living Arrangements: Alone Available Help at Discharge: Family Type of Home:  House Home Access: Stairs to enter     Home Layout: One level Home Equipment: Environmental consultant - 2 wheels;Cane - single point Additional Comments: Pt states he will likely go to son's home with 3 steps to enter but no rails    Prior Function Level of Independence: Independent               Hand Dominance   Dominant Hand: Right    Extremity/Trunk Assessment   Upper Extremity Assessment: Overall WFL for tasks assessed           Lower Extremity Assessment: LLE deficits/detail   LLE Deficits / Details: Pt able to perform IND SLR   Cervical / Trunk Assessment: Kyphotic  Communication   Communication: No difficulties  Cognition Arousal/Alertness: Awake/alert Behavior During Therapy: WFL for tasks assessed/performed Overall Cognitive Status: Within Functional Limits for tasks assessed                      General Comments      Exercises        Assessment/Plan    PT Assessment Patient needs continued PT services  PT Diagnosis Difficulty walking   PT Problem List Decreased strength;Decreased range of motion;Decreased activity tolerance;Decreased mobility;Decreased knowledge of use of DME;Pain  PT Treatment Interventions DME instruction;Gait training;Stair training;Functional mobility training;Therapeutic activities;Therapeutic exercise;Patient/family education   PT Goals (Current goals can be found in  the Care Plan section) Acute Rehab PT Goals Patient Stated Goal: Resume previous lifestyle  PT Goal Formulation: With patient Time For Goal Achievement: 08/04/14 Potential to Achieve Goals: Good    Frequency 7X/week   Barriers to discharge        Co-evaluation               End of Session Equipment Utilized During Treatment: Gait belt Activity Tolerance: Patient tolerated treatment well Patient left: in chair;with call bell/phone within reach;with family/visitor present Nurse Communication: Mobility status         Time: 4742-5956 PT Time  Calculation (min) (ACUTE ONLY): 22 min   Charges:   PT Evaluation $Initial PT Evaluation Tier I: 1 Procedure     PT G Codes:        [name] August 29, 2014, 1:05 PM

## 2014-07-31 NOTE — Progress Notes (Signed)
   Subjective: 2 Days Post-Op Procedure(s) (LRB): IRRIGATION AND DEBRIDEMENT WITH BURSECTOMY OF LEFT  KNEE  (Left) Patient reports pain as mild.   Patient seen in rounds with Dr. Wynelle Link. Patient is well, but has had some minor complaints of pain in the left knee, requiring pain medications We will start therapy today.  PT ordered Plan is to go Home after hospital stay.  Objective: Vital signs in last 24 hours: Temp:  [98.2 F (36.8 C)-98.7 F (37.1 C)] 98.7 F (37.1 C) (07/21 0445) Pulse Rate:  [57-61] 57 (07/21 0445) Resp:  [16] 16 (07/21 0445) BP: (112-138)/(50-68) 112/50 mmHg (07/21 0445) SpO2:  [96 %-97 %] 96 % (07/21 0445)  Intake/Output from previous day: 07/20 0701 - 07/21 0700 In: 970 [P.O.:720; IV Piggyback:250] Out: 2525 [Urine:2525]  Recent Labs  07/29/14 0408  HGB 10.8*    Recent Labs  07/29/14 0408  WBC 8.8  RBC 3.34*  HCT 33.3*  PLT 254    Recent Labs  07/29/14 0408 07/31/14 0438  NA 133*  --   K 4.7  --   CL 104  --   CO2 24  --   BUN 34*  --   CREATININE 1.29* 1.14  GLUCOSE 256*  --   CALCIUM 8.5*  --    Wound Cultures: ABUNDANT GRAM POSITIVE COCCI IN PAIRS No growth at this time.  EXAM General - Patient is Alert and Appropriate Extremity - Neurovascular intact Sensation intact distally Dorsiflexion/Plantar flexion intact Incision - no drainage and staples intact.  Surrounding erythema improved since washout Hemovac drain still in place Motor Function - intact, moving foot and toes well on exam.   Past Medical History  Diagnosis Date  . Diabetes mellitus without complication     takes lisinopril for kidneys  . Arthritis   . S/P knee replacement left 2011, right 2012    Assessment/Plan: 2 Days Post-Op Procedure(s) (LRB): IRRIGATION AND DEBRIDEMENT WITH BURSECTOMY OF LEFT  KNEE  (Left) Active Problems:   Prepatellar abscess, left knee  Estimated body mass index is 29.23 kg/(m^2) as calculated from the following:  Height as of this encounter: 5\' 9"  (1.753 m).   Weight as of this encounter: 89.812 kg (198 lb). Up with therapy Plan for discharge tomorrow Discharge home with home health  DVT Prophylaxis - Aspirin Weight-Bearing as tolerated to left leg PT consult for gait training  Arlee Muslim, PA-C Orthopaedic Surgery 07/31/2014, 7:21 AM

## 2014-08-01 LAB — WOUND CULTURE

## 2014-08-01 LAB — GLUCOSE, CAPILLARY: Glucose-Capillary: 184 mg/dL — ABNORMAL HIGH (ref 65–99)

## 2014-08-01 MED ORDER — OXYCODONE HCL 5 MG PO TABS: 5.0000 mg | ORAL_TABLET | ORAL | Status: DC | PRN

## 2014-08-01 MED ORDER — TRAMADOL HCL 50 MG PO TABS: 50.0000 mg | ORAL_TABLET | Freq: Four times a day (QID) | ORAL | Status: DC | PRN

## 2014-08-01 MED ORDER — DOXYCYCLINE HYCLATE 100 MG PO TABS: 100.0000 mg | ORAL_TABLET | Freq: Two times a day (BID) | ORAL | Status: DC

## 2014-08-01 MED ORDER — DOXYCYCLINE HYCLATE 100 MG PO TABS
100.0000 mg | ORAL_TABLET | Freq: Two times a day (BID) | ORAL | Status: DC
  Administered 2014-08-01: 100 mg via ORAL
  Filled 2014-08-01 (×2): qty 1

## 2014-08-01 MED ORDER — METHOCARBAMOL 500 MG PO TABS: 500.0000 mg | ORAL_TABLET | Freq: Four times a day (QID) | ORAL | Status: DC | PRN

## 2014-08-01 NOTE — Progress Notes (Signed)
Physical Therapy Treatment Patient Details Name: [name] MRN: [id number] DOB: 06/22/1932 Today's Date: 08/01/2014    History of Present Illness S/p I&D with bursectomy L knee; s/p Bil TKR and Bil THR    PT Comments    Pt progressing well.  Ready for D/C  To home.    Follow Up Recommendations  No PT follow up     Equipment Recommendations  None recommended by PT    Recommendations for Other Services       Precautions / Restrictions Precautions Precautions: Fall Restrictions Weight Bearing Restrictions: No LLE Weight Bearing: Weight bearing as tolerated    Mobility  Bed Mobility Overal bed mobility: Modified Independent                Transfers Overall transfer level: Needs assistance Equipment used: Rolling walker (2 wheeled) Transfers: Sit to/from Stand Sit to Stand: Supervision         General transfer comment: good safety cognition and use of hands  Ambulation/Gait Ambulation/Gait assistance: Min guard;Supervision Ambulation Distance (Feet): 85 Feet Assistive device: Rolling walker (2 wheeled) Gait Pattern/deviations: Step-to pattern;Decreased stance time - left Gait velocity: decreased   General Gait Details: cues for posture, position from RW and initial sequence   Stairs   Stairs assistance: Supervision Stair Management: Two rails;Forwards Number of Stairs: 2 General stair comments: one initial VC for proper sequencing  Wheelchair Mobility    Modified Rankin (Stroke Patients Only)       Balance                                    Cognition Arousal/Alertness: Awake/alert Behavior During Therapy: WFL for tasks assessed/performed Overall Cognitive Status: Within Functional Limits for tasks assessed                      Exercises      General Comments        Pertinent Vitals/Pain Pain Assessment: 0-10 Pain Score: 2  Pain Location: L knee Pain Descriptors / Indicators: Sore Pain  Intervention(s): Monitored during session;Repositioned    Home Living                      Prior Function            PT Goals (current goals can now be found in the care plan section) Progress towards PT goals: Progressing toward goals    Frequency  7X/week    PT Plan      Co-evaluation             End of Session Equipment Utilized During Treatment: Gait belt Activity Tolerance: Patient tolerated treatment well Patient left: in chair;with call bell/phone within reach;with family/visitor present     Time: 0935-1000 PT Time Calculation (min) (ACUTE ONLY): 25 min  Charges:  $Gait Training: 8-22 mins $Therapeutic Activity: 8-22 mins                    G Codes:      Rica Koyanagi  PTA [facility]  Acute  Rehab Pager      [phone]

## 2014-08-01 NOTE — Progress Notes (Signed)
   Subjective: 3 Days Post-Op Procedure(s) (LRB): IRRIGATION AND DEBRIDEMENT WITH BURSECTOMY OF LEFT  KNEE  (Left) Patient reports pain as mild.   Patient seen in rounds with Dr. Wynelle Link. Patient is well, but has had some minor complaints of pain in the knee, requiring pain medications Patient is ready to go home today and will switch to oral antibiotics.  Objective: Vital signs in last 24 hours: Temp:  [98.3 F (36.8 C)-98.9 F (37.2 C)] 98.9 F (37.2 C) (07/22 0509) Pulse Rate:  [55-67] 67 (07/22 0509) Resp:  [14-16] 16 (07/22 0509) BP: (108-146)/(50-58) 146/54 mmHg (07/22 0509) SpO2:  [94 %-99 %] 94 % (07/22 0509)  Intake/Output from previous day:  Intake/Output Summary (Last 24 hours) at 08/01/14 0839 Last data filed at 08/01/14 0731  Gross per 24 hour  Intake    840 ml  Output   2850 ml  Net  -2010 ml    Intake/Output this shift: Total I/O In: -  Out: 500 [Urine:500]  Labs: No results for input(s): HGB in the last 72 hours. No results for input(s): WBC, RBC, HCT, PLT in the last 72 hours.  Recent Labs  07/31/14 0438  CREATININE 1.14   No results for input(s): LABPT, INR in the last 72 hours.  EXAM: General - Patient is Alert and Appropriate Extremity - Neurovascular intact Sensation intact distally Dorsiflexion/Plantar flexion intact Incision - clean, dry, minimal erythema, no active drainage, staples intact Motor Function - intact, moving foot and toes well on exam.   Assessment/Plan: 3 Days Post-Op Procedure(s) (LRB): IRRIGATION AND DEBRIDEMENT WITH BURSECTOMY OF LEFT  KNEE  (Left) Procedure(s) (LRB): IRRIGATION AND DEBRIDEMENT WITH BURSECTOMY OF LEFT  KNEE  (Left) Past Medical History  Diagnosis Date  . Diabetes mellitus without complication     takes lisinopril for kidneys  . Arthritis   . S/P knee replacement left 2011, right 2012   Active Problems:   Prepatellar abscess, left knee  Estimated body mass index is 29.23 kg/(m^2) as calculated  from the following:   Height as of this encounter: 5\' 9"  (1.753 m).   Weight as of this encounter: 89.812 kg (198 lb). Discharge home Diet - Diabetic diet Follow up - in 1 week for a wound check Activity - WBAT Disposition - Home Condition Upon Discharge - Stable D/C Meds - See DC Summary DVT Prophylaxis - Aspirin  Arlee Muslim, PA-C Orthopaedic Surgery 08/01/2014, 8:39 AM

## 2014-08-01 NOTE — Discharge Summary (Signed)
Physician Discharge Summary   Patient ID: [name] MRN: [id number] DOB/AGE: [birth year] 81 y.o.  Admit date: 07/28/2014 Discharge date: 08/01/2014  Primary Diagnosis:  Infected left prepatellar bursa.  Admission Diagnoses:  Past Medical History  Diagnosis Date  . Diabetes mellitus without complication     takes lisinopril for kidneys  . Arthritis   . S/P knee replacement left 2011, right 2012   Discharge Diagnoses:   Active Problems:   Prepatellar abscess, left knee  Estimated body mass index is 29.23 kg/(m^2) as calculated from the following:   Height as of this encounter: 5' 9" (1.753 m).   Weight as of this encounter: 89.812 kg (198 lb).  Procedure(s) (LRB): IRRIGATION AND DEBRIDEMENT WITH BURSECTOMY OF LEFT  KNEE  (Left)   Consults: None  HPI: [name] is an 81 year old male who has had multiple joint replacements including bilateral total knee and total hips and last week cut his left knee while fixing a lawnmower. Yesterday, he presents to our office with purulent drainage from an open wound on the knee. He is admitted for IV antibiotics and taken to the operating room tonight for irrigation and debridement. It appears that the wound was isolated to the bursa.  Laboratory Data: Admission on 07/28/2014  Component Date Value Ref Range Status  . Glucose-Capillary 07/28/2014 244* 65 - 99 mg/dL Final  . Sodium 07/29/2014 133* 135 - 145 mmol/L Final  . Potassium 07/29/2014 4.7  3.5 - 5.1 mmol/L Final  . Chloride 07/29/2014 104  101 - 111 mmol/L Final  . CO2 07/29/2014 24  22 - 32 mmol/L Final  . Glucose, Bld 07/29/2014 256* 65 - 99 mg/dL Final  . BUN 07/29/2014 34* 6 - 20 mg/dL Final  . Creatinine, Ser 07/29/2014 1.29* 0.61 - 1.24 mg/dL Final  . Calcium 07/29/2014 8.5* 8.9 - 10.3 mg/dL Final  . Total Protein 07/29/2014 6.9  6.5 - 8.1 g/dL Final  . Albumin 07/29/2014 3.0* 3.5 - 5.0 g/dL Final  . AST 07/29/2014 13* 15 - 41 U/L Final  . ALT 07/29/2014  9* 17 - 63 U/L Final  . Alkaline Phosphatase 07/29/2014 59  38 - 126 U/L Final  . Total Bilirubin 07/29/2014 0.9  0.3 - 1.2 mg/dL Final  . GFR calc non Af Amer 07/29/2014 51* >60 mL/min Final  . GFR calc Af Amer 07/29/2014 59* >60 mL/min Final   Comment: (NOTE) The eGFR has been calculated using the CKD EPI equation. This calculation has not been validated in all clinical situations. eGFR's persistently <60 mL/min signify possible Chronic Kidney Disease.   . Anion gap 07/29/2014 5  5 - 15 Final  . WBC 07/29/2014 8.8  4.0 - 10.5 K/uL Final  . RBC 07/29/2014 3.34* 4.22 - 5.81 MIL/uL Final  . Hemoglobin 07/29/2014 10.8* 13.0 - 17.0 g/dL Final  . HCT 07/29/2014 33.3* 39.0 - 52.0 % Final  . MCV 07/29/2014 99.7  78.0 - 100.0 fL Final  . MCH 07/29/2014 32.3  26.0 - 34.0 pg Final  . MCHC 07/29/2014 32.4  30.0 - 36.0 g/dL Final  . RDW 07/29/2014 12.4  11.5 - 15.5 % Final  . Platelets 07/29/2014 254  150 - 400 K/uL Final  . Creatinine, Ser 07/28/2014 1.61* 0.61 - 1.24 mg/dL Final  . GFR calc non Af Amer 07/28/2014 39* >60 mL/min Final  . GFR calc Af Amer 07/28/2014 45* >60 mL/min Final   Comment: (NOTE) The eGFR has been calculated using the CKD EPI equation. This calculation has  not been validated in all clinical situations. eGFR's persistently <60 mL/min signify possible Chronic Kidney Disease.   . Glucose-Capillary 07/28/2014 248* 65 - 99 mg/dL Final  . Comment 1 07/28/2014 Notify RN   Final  . Glucose-Capillary 07/29/2014 222* 65 - 99 mg/dL Final  . Glucose-Capillary 07/29/2014 224* 65 - 99 mg/dL Final  . Specimen Description 07/29/2014 WOUND   Final  . Special Requests 07/29/2014 NONE   Final  . Gram Stain 07/29/2014    Final                   Value:ABUNDANT WBC PRESENT, PREDOMINANTLY PMN NO SQUAMOUS EPITHELIAL CELLS SEEN ABUNDANT GRAM POSITIVE COCCI IN PAIRS Performed at [facility]   . Culture 07/29/2014    Final                   Value:NO ANAEROBES ISOLATED; CULTURE  IN PROGRESS FOR 5 DAYS Performed at [facility]   . Report Status 07/29/2014 PENDING   Incomplete  . Specimen Description 07/29/2014 WOUND   Final  . Special Requests 07/29/2014 NONE   Final  . Gram Stain 07/29/2014    Final                   Value:ABUNDANT WBC PRESENT, PREDOMINANTLY PMN NO SQUAMOUS EPITHELIAL CELLS SEEN ABUNDANT GRAM POSITIVE COCCI IN PAIRS Performed at [facility]   . Culture 07/29/2014    Final                   Value:MODERATE STAPHYLOCOCCUS AUREUS Note: RIFAMPIN AND GENTAMICIN SHOULD NOT BE USED AS SINGLE DRUGS FOR TREATMENT OF STAPH INFECTIONS. This organism DOES NOT demonstrate inducible Clindamycin resistance in vitro. Performed at [facility]   . Report Status 07/29/2014 08/01/2014 FINAL   Final  . Organism ID, Bacteria 07/29/2014 STAPHYLOCOCCUS AUREUS   Final  . Glucose-Capillary 07/29/2014 140* 65 - 99 mg/dL Final  . Glucose-Capillary 07/29/2014 196* 65 - 99 mg/dL Final  . Comment 1 07/29/2014 Notify RN   Final  . Glucose-Capillary 07/29/2014 149* 65 - 99 mg/dL Final  . Comment 1 07/29/2014 Document in Chart   Final  . Glucose-Capillary 07/30/2014 239* 65 - 99 mg/dL Final  . Glucose-Capillary 07/30/2014 239* 65 - 99 mg/dL Final  . Creatinine, Ser 07/31/2014 1.14  0.61 - 1.24 mg/dL Final  . GFR calc non Af Amer 07/31/2014 59* >60 mL/min Final  . GFR calc Af Amer 07/31/2014 >60  >60 mL/min Final   Comment: (NOTE) The eGFR has been calculated using the CKD EPI equation. This calculation has not been validated in all clinical situations. eGFR's persistently <60 mL/min signify possible Chronic Kidney Disease.   . Glucose-Capillary 07/30/2014 108* 65 - 99 mg/dL Final  . Glucose-Capillary 07/30/2014 121* 65 - 99 mg/dL Final  . Comment 1 07/30/2014 Notify RN   Final  . Comment 2 07/30/2014 Document in Chart   Final  . Glucose-Capillary 07/31/2014 109* 65 - 99 mg/dL Final  . Glucose-Capillary 07/31/2014 181* 65 - 99 mg/dL Final  .  Comment 1 07/31/2014 Notify RN   Final  . Comment 2 07/31/2014 Document in Chart   Final  . Vancomycin Tr 07/31/2014 9* 10.0 - 20.0 ug/mL Final  . Glucose-Capillary 07/31/2014 100* 65 - 99 mg/dL Final  . Glucose-Capillary 07/31/2014 70  65 - 99 mg/dL Final  . Comment 1 07/31/2014 Notify RN   Final  . Glucose-Capillary 08/01/2014 184* 65 - 99 mg/dL Final  X-Rays:No results found.  EKG: Orders placed or performed during the hospital encounter of 11/02/11  . EKG     Hospital Course: Patient was admitted to [facility] and taken to the OR and underwent the above state procedure without complications.  Patient tolerated the procedure well and was later transferred to the recovery room and then to the orthopaedic floor for postoperative care.  They were given PO and IV analgesics for pain control following their surgery.  They were given 24 hours of postoperative antibiotics of  Anti-infectives    Start     Dose/Rate Route Frequency Ordered Stop   08/01/14 1000  doxycycline (VIBRA-TABS) tablet 100 mg     100 mg Oral Every 12 hours 08/01/14 0847     08/01/14 0000  doxycycline (VIBRA-TABS) 100 MG tablet     100 mg Oral 2 times daily 08/01/14 0851     07/31/14 1700  vancomycin (VANCOCIN) 2,000 mg in sodium chloride 0.9 % 500 mL IVPB     2,000 mg 250 mL/hr over 120 Minutes Intravenous Every 24 hours 07/31/14 1613     07/29/14 1600  vancomycin (VANCOCIN) 1,250 mg in sodium chloride 0.9 % 250 mL IVPB  Status:  Discontinued     1,250 mg 166.7 mL/hr over 90 Minutes Intravenous Every 24 hours 07/28/14 1906 07/31/14 1613   07/28/14 1800  vancomycin (VANCOCIN) IVPB 1000 mg/200 mL premix     1,000 mg 200 mL/hr over 60 Minutes Intravenous  Once 07/28/14 1708 07/28/14 1914     and started on DVT prophylaxis in the form of Aspirin. Patient was encouraged to get up and ambulate the day after surgery.  The patient was allowed to be WBAT with therapy. Discharge planning was consulted to help  with postop disposition and equipment needs.  Patient had a decent night on the evening of surgery.  They started to get up OOB with therapy on day one.   Patient was seen in rounds on day two.  Wound Cultures: ABUNDANT GRAM POSITIVE COCCI IN PAIRS No growth at that time.  Continued to work with therapy. He was seen on day three and was ready to go home following morning ambulation.  He was switched to oral antibiotics prior to discharge.  Discharge home Diet - Diabetic diet Follow up - in 1 week for a wound check Activity - WBAT Disposition - Home Condition Upon Discharge - Stable D/C Meds - See DC Summary DVT Prophylaxis - Aspirin   Discharge Instructions    Call MD / Call 911    Complete by:  As directed   If you experience chest pain or shortness of breath, CALL 911 and be transported to the hospital emergency room.  If you develope a fever above 101 F, pus (white drainage) or increased drainage or redness at the wound, or calf pain, call your surgeon's office.     Change dressing    Complete by:  As directed   Change dressing daily with sterile 4 x 4 inch gauze dressing and apply TED hose. Do not submerge the incision under water.     Constipation Prevention    Complete by:  As directed   Drink plenty of fluids.  Prune juice may be helpful.  You may use a stool softener, such as Colace (over the counter) 100 mg twice a day.  Use MiraLax (over the counter) for constipation as needed.     Diet Carb Modified    Complete by:  As directed  Discharge instructions    Complete by:  As directed   Pick up stool softner and laxative for home use following surgery while on pain medications. Do not submerge incision under water. Please use good hand washing techniques while changing dressing each day. May shower starting three days after surgery. Please use a clean towel to pat the incision dry following showers. Continue to use ice for pain and swelling after surgery. Do not use any  lotions or creams on the incision until instructed by your surgeon.  Resume the Aspirin 81 mg at home.  Postoperative Constipation Protocol  Constipation - defined medically as fewer than three stools per week and severe constipation as less than one stool per week.  One of the most common issues patients have following surgery is constipation.  Even if you have a regular bowel pattern at home, your normal regimen is likely to be disrupted due to multiple reasons following surgery.  Combination of anesthesia, postoperative narcotics, change in appetite and fluid intake all can affect your bowels.  In order to avoid complications following surgery, here are some recommendations in order to help you during your recovery period.  Colace (docusate) - Pick up an over-the-counter form of Colace or another stool softener and take twice a day as long as you are requiring postoperative pain medications.  Take with a full glass of water daily.  If you experience loose stools or diarrhea, hold the colace until you stool forms back up.  If your symptoms do not get better within 1 week or if they get worse, check with your doctor.  Dulcolax (bisacodyl) - Pick up over-the-counter and take as directed by the product packaging as needed to assist with the movement of your bowels.  Take with a full glass of water.  Use this product as needed if not relieved by Colace only.   MiraLax (polyethylene glycol) - Pick up over-the-counter to have on hand.  MiraLax is a solution that will increase the amount of water in your bowels to assist with bowel movements.  Take as directed and can mix with a glass of water, juice, soda, coffee, or tea.  Take if you go more than two days without a movement. Do not use MiraLax more than once per day. Call your doctor if you are still constipated or irregular after using this medication for 7 days in a row.  If you continue to have problems with postoperative constipation, please  contact the office for further assistance and recommendations.  If you experience "the worst abdominal pain ever" or develop nausea or vomiting, please contact the office immediatly for further recommendations for treatment.     Do not put a pillow under the knee. Place it under the heel.    Complete by:  As directed      Do not sit on low chairs, stoools or toilet seats, as it may be difficult to get up from low surfaces    Complete by:  As directed      Driving restrictions    Complete by:  As directed   No driving until released by the physician.     Increase activity slowly as tolerated    Complete by:  As directed      Lifting restrictions    Complete by:  As directed   No lifting until released by the physician.     Patient may shower    Complete by:  As directed   You may shower without a  dressing once there is no drainage.  Do not wash over the wound.  If drainage remains, do not shower until drainage stops.     TED hose    Complete by:  As directed   Use stockings (TED hose) for 3 weeks on both leg(s).  You may remove them at night for sleeping.     Weight bearing as tolerated    Complete by:  As directed   Laterality:  left  Extremity:  Lower            Medication List    STOP taking these medications        ibuprofen 600 MG tablet  Commonly known as:  ADVIL,MOTRIN      TAKE these medications        aspirin 81 MG tablet  Take 81 mg by mouth daily.     dorzolamide-timolol 22.3-6.8 MG/ML ophthalmic solution  Commonly known as:  COSOPT  Place 1 drop into both eyes 2 (two) times daily.     doxycycline 100 MG tablet  Commonly known as:  VIBRA-TABS  Take 1 tablet (100 mg total) by mouth 2 (two) times daily.     insulin NPH-regular Human (70-30) 100 UNIT/ML injection  Commonly known as:  NOVOLIN 70/30  Inject 15 Units into the skin 2 (two) times daily with a meal.     methocarbamol 500 MG tablet  Commonly known as:  ROBAXIN  Take 1 tablet (500 mg total) by  mouth every 6 (six) hours as needed for muscle spasms.     oxyCODONE 5 MG immediate release tablet  Commonly known as:  Oxy IR/ROXICODONE  Take 1-2 tablets (5-10 mg total) by mouth every 4 (four) hours as needed for moderate pain or severe pain.     traMADol 50 MG tablet  Commonly known as:  ULTRAM  Take 1-2 tablets (50-100 mg total) by mouth every 6 (six) hours as needed (mild pain).           Follow-up Information    Follow up with Gearlean Alf, MD. Schedule an appointment as soon as possible for a visit on 08/06/2014.   Specialty:  Orthopedic Surgery   Why:  Call office at [phone] to setup appointment on Wednesday afternoon 08/06/2014 with Arlee Muslim, [facility] for a wound check.   Contact information:   [address] [place] [address] [phone]       Signed: Arlee Muslim, PA-C Orthopaedic Surgery 08/01/2014, 9:03 AM

## 2014-08-01 NOTE — Discharge Instructions (Signed)
Dr. Gaynelle Arabian Total [facility] Specialist [facility] [address], [place], [place] [address] [phone]  Bursectomy Discharge Instructions  HOME CARE INSTRUCTIONS  Remove items at home which could result in a fall. This includes throw rugs or furniture in walking pathways.   ICE to the affected hip every three hours for 30 minutes at a time and then as needed for pain and swelling.  Continue to use ice on the hip for pain and swelling from surgery. You may notice swelling that will progress down to the foot and ankle.  This is normal after surgery.  Elevate the leg when you are not up walking on it.    Continue to use the breathing machine which will help keep your temperature down.  It is common for your temperature to cycle up and down following surgery, especially at night when you are not up moving around and exerting yourself.  The breathing machine keeps your lungs expanded and your temperature down. Sit on high chairs which makes it easier to stand.  Sit on chairs with arms. Use the chair arms to help push yourself up when arising.   Use your walker for first several days until comfortable ambulating.  DIET You may resume your previous home diet once your are discharged from the hospital.  DRESSING / WOUND CARE / SHOWERING You may shower 3 days after surgery, but keep the wounds dry during showering.  You may use an occlusive plastic wrap (Press'n Seal for example), NO SOAKING/SUBMERGING IN THE BATHTUB.  If the bandage gets wet, change with a clean dry gauze.  If the incision gets wet, pat the wound dry with a clean towel. You may start showering three days following surgery but do not submerge the incision under water.Just pat the incision dry and apply a dry gauze dressing on daily. Change the surgical dressing daily and reapply a dry dressing each time.  ACTIVITY Walk with your walker as instructed. Use walker as long as suggested by your  caregivers. Avoid periods of inactivity such as sitting longer than an hour when not asleep. This helps prevent blood clots.  You may resume a sexual relationship in one month or when given the OK by your doctor.  You may return to work once you are cleared by your doctor.  Do not drive a car for 6 weeks or until released by you surgeon.  Do not drive while taking narcotics.  WEIGHT BEARING Weight bearing as tolerated with assist device (walker, cane, etc) as directed, use it as long as suggested by your surgeon or therapist, typically at least 4-6 weeks.  POSTOPERATIVE CONSTIPATION PROTOCOL Constipation - defined medically as fewer than three stools per week and severe constipation as less than one stool per week.  One of the most common issues patients have following surgery is constipation.  Even if you have a regular bowel pattern at home, your normal regimen is likely to be disrupted due to multiple reasons following surgery.  Combination of anesthesia, postoperative narcotics, change in appetite and fluid intake all can affect your bowels.  In order to avoid complications following surgery, here are some recommendations in order to help you during your recovery period.  Colace (docusate) - Pick up an over-the-counter form of Colace or another stool softener and take twice a day as long as you are requiring postoperative pain medications.  Take with a full glass of water daily.  If you experience loose stools or diarrhea, hold the  colace until you stool forms back up.  If your symptoms do not get better within 1 week or if they get worse, check with your doctor.  Dulcolax (bisacodyl) - Pick up over-the-counter and take as directed by the product packaging as needed to assist with the movement of your bowels.  Take with a full glass of water.  Use this product as needed if not relieved by Colace only.   MiraLax (polyethylene glycol) - Pick up over-the-counter to have on hand.  MiraLax is a  solution that will increase the amount of water in your bowels to assist with bowel movements.  Take as directed and can mix with a glass of water, juice, soda, coffee, or tea.  Take if you go more than two days without a movement. Do not use MiraLax more than once per day. Call your doctor if you are still constipated or irregular after using this medication for 7 days in a row.  If you continue to have problems with postoperative constipation, please contact the office for further assistance and recommendations.  If you experience "the worst abdominal pain ever" or develop nausea or vomiting, please contact the office immediatly for further recommendations for treatment.  ITCHING  If you experience itching with your medications, try taking only a single pain pill, or even half a pain pill at a time.  You can also use Benadryl over the counter for itching or also to help with sleep.   TED HOSE STOCKINGS Wear the elastic stockings on both legs for three weeks following surgery during the day but you may remove then at night for sleeping.  MEDICATIONS See your medication summary on the After Visit Summary that the nursing staff will review with you prior to discharge.  You may have some home medications which will be placed on hold until you complete the course of blood thinner medication.  It is important for you to complete the blood thinner medication as prescribed by your surgeon.  Continue your approved medications as instructed at time of discharge.  PRECAUTIONS If you experience chest pain or shortness of breath - call 911 immediately for transfer to the hospital emergency department.  If you develop a fever greater that 101 F, purulent drainage from wound, increased redness or drainage from wound, foul odor from the wound/dressing, or calf pain - CONTACT YOUR SURGEON.                                                   FOLLOW-UP APPOINTMENTS Make sure you keep all of your appointments after  your operation with your surgeon and caregivers. You should call the office at the above phone number and make an appointment for approximately two weeks after the date of your surgery or on the date instructed by your surgeon outlined in the "After Visit Summary".   Pick up stool softner and laxative for home use following surgery while on pain medications. Do not submerge incision under water. Please use good hand washing techniques while changing dressing each day. May shower starting three days after surgery. Please use a clean towel to pat the incision dry following showers. Continue to use ice for pain and swelling after surgery. Do not use any lotions or creams on the incision until instructed by your surgeon.  Resume the 81 mg Aspirin at home.

## 2014-08-01 NOTE — Progress Notes (Signed)
Pt to d/c home. AVS reviewed and "My Chart" discussed with pt. Pt capable of verbalizing medications, dressing changes, signs and symptoms of infection, and follow-up appointments. Remains hemodynamically stable. No signs and symptoms of distress. Educated pt to return to ER in the case of SOB, dizziness, or chest pain.  

## 2014-08-03 LAB — ANAEROBIC CULTURE

## 2014-08-07 DIAGNOSIS — T8459XD Infection and inflammatory reaction due to other internal joint prosthesis, subsequent encounter: Secondary | ICD-10-CM | POA: Diagnosis not present

## 2014-08-07 DIAGNOSIS — Z4789 Encounter for other orthopedic aftercare: Secondary | ICD-10-CM | POA: Diagnosis not present

## 2014-08-12 DIAGNOSIS — Z4789 Encounter for other orthopedic aftercare: Secondary | ICD-10-CM | POA: Diagnosis not present

## 2014-08-12 DIAGNOSIS — M7042 Prepatellar bursitis, left knee: Secondary | ICD-10-CM | POA: Diagnosis not present

## 2014-09-18 DIAGNOSIS — Z96652 Presence of left artificial knee joint: Secondary | ICD-10-CM | POA: Diagnosis not present

## 2014-09-18 DIAGNOSIS — Z4789 Encounter for other orthopedic aftercare: Secondary | ICD-10-CM | POA: Diagnosis not present

## 2014-09-22 DIAGNOSIS — H2512 Age-related nuclear cataract, left eye: Secondary | ICD-10-CM | POA: Diagnosis not present

## 2014-09-22 DIAGNOSIS — H25812 Combined forms of age-related cataract, left eye: Secondary | ICD-10-CM | POA: Diagnosis not present

## 2014-10-03 DIAGNOSIS — E1142 Type 2 diabetes mellitus with diabetic polyneuropathy: Secondary | ICD-10-CM | POA: Diagnosis not present

## 2014-10-03 DIAGNOSIS — L851 Acquired keratosis [keratoderma] palmaris et plantaris: Secondary | ICD-10-CM | POA: Diagnosis not present

## 2014-10-03 DIAGNOSIS — B351 Tinea unguium: Secondary | ICD-10-CM | POA: Diagnosis not present

## 2014-11-04 DIAGNOSIS — H2511 Age-related nuclear cataract, right eye: Secondary | ICD-10-CM | POA: Diagnosis not present

## 2014-11-10 DIAGNOSIS — H25811 Combined forms of age-related cataract, right eye: Secondary | ICD-10-CM | POA: Diagnosis not present

## 2014-11-10 DIAGNOSIS — H2511 Age-related nuclear cataract, right eye: Secondary | ICD-10-CM | POA: Diagnosis not present

## 2014-12-25 DIAGNOSIS — E782 Mixed hyperlipidemia: Secondary | ICD-10-CM | POA: Diagnosis not present

## 2014-12-25 DIAGNOSIS — E1122 Type 2 diabetes mellitus with diabetic chronic kidney disease: Secondary | ICD-10-CM | POA: Diagnosis not present

## 2014-12-25 DIAGNOSIS — Z125 Encounter for screening for malignant neoplasm of prostate: Secondary | ICD-10-CM | POA: Diagnosis not present

## 2014-12-30 DIAGNOSIS — B351 Tinea unguium: Secondary | ICD-10-CM | POA: Diagnosis not present

## 2014-12-30 DIAGNOSIS — E1142 Type 2 diabetes mellitus with diabetic polyneuropathy: Secondary | ICD-10-CM | POA: Diagnosis not present

## 2014-12-30 DIAGNOSIS — L851 Acquired keratosis [keratoderma] palmaris et plantaris: Secondary | ICD-10-CM | POA: Diagnosis not present

## 2015-01-01 DIAGNOSIS — L989 Disorder of the skin and subcutaneous tissue, unspecified: Secondary | ICD-10-CM | POA: Diagnosis not present

## 2015-01-01 DIAGNOSIS — E875 Hyperkalemia: Secondary | ICD-10-CM | POA: Diagnosis not present

## 2015-01-01 DIAGNOSIS — E1122 Type 2 diabetes mellitus with diabetic chronic kidney disease: Secondary | ICD-10-CM | POA: Diagnosis not present

## 2015-01-01 DIAGNOSIS — D509 Iron deficiency anemia, unspecified: Secondary | ICD-10-CM | POA: Diagnosis not present

## 2015-01-01 DIAGNOSIS — N183 Chronic kidney disease, stage 3 (moderate): Secondary | ICD-10-CM | POA: Diagnosis not present

## 2015-01-01 DIAGNOSIS — D690 Allergic purpura: Secondary | ICD-10-CM | POA: Diagnosis not present

## 2015-01-01 DIAGNOSIS — E782 Mixed hyperlipidemia: Secondary | ICD-10-CM | POA: Diagnosis not present

## 2015-01-05 ENCOUNTER — Other Ambulatory Visit: Payer: Self-pay | Admitting: Family Medicine

## 2015-01-28 DIAGNOSIS — L989 Disorder of the skin and subcutaneous tissue, unspecified: Secondary | ICD-10-CM | POA: Diagnosis not present

## 2015-02-03 DIAGNOSIS — Z886 Allergy status to analgesic agent status: Secondary | ICD-10-CM | POA: Diagnosis not present

## 2015-02-03 DIAGNOSIS — C44320 Squamous cell carcinoma of skin of unspecified parts of face: Secondary | ICD-10-CM | POA: Diagnosis not present

## 2015-02-03 DIAGNOSIS — Z96643 Presence of artificial hip joint, bilateral: Secondary | ICD-10-CM | POA: Diagnosis not present

## 2015-02-03 DIAGNOSIS — L989 Disorder of the skin and subcutaneous tissue, unspecified: Secondary | ICD-10-CM | POA: Diagnosis not present

## 2015-02-03 DIAGNOSIS — Z794 Long term (current) use of insulin: Secondary | ICD-10-CM | POA: Diagnosis not present

## 2015-02-03 DIAGNOSIS — Z96653 Presence of artificial knee joint, bilateral: Secondary | ICD-10-CM | POA: Diagnosis not present

## 2015-02-03 DIAGNOSIS — C44329 Squamous cell carcinoma of skin of other parts of face: Secondary | ICD-10-CM | POA: Diagnosis not present

## 2015-02-03 DIAGNOSIS — E119 Type 2 diabetes mellitus without complications: Secondary | ICD-10-CM | POA: Diagnosis not present

## 2015-02-18 DIAGNOSIS — C44320 Squamous cell carcinoma of skin of unspecified parts of face: Secondary | ICD-10-CM | POA: Diagnosis not present

## 2015-02-18 DIAGNOSIS — T8130XA Disruption of wound, unspecified, initial encounter: Secondary | ICD-10-CM | POA: Diagnosis not present

## 2015-03-10 DIAGNOSIS — L851 Acquired keratosis [keratoderma] palmaris et plantaris: Secondary | ICD-10-CM | POA: Diagnosis not present

## 2015-03-10 DIAGNOSIS — B351 Tinea unguium: Secondary | ICD-10-CM | POA: Diagnosis not present

## 2015-03-10 DIAGNOSIS — E1142 Type 2 diabetes mellitus with diabetic polyneuropathy: Secondary | ICD-10-CM | POA: Diagnosis not present

## 2015-04-15 DIAGNOSIS — H40053 Ocular hypertension, bilateral: Secondary | ICD-10-CM | POA: Diagnosis not present

## 2015-04-15 DIAGNOSIS — Z961 Presence of intraocular lens: Secondary | ICD-10-CM | POA: Diagnosis not present

## 2015-04-15 DIAGNOSIS — H02831 Dermatochalasis of right upper eyelid: Secondary | ICD-10-CM | POA: Diagnosis not present

## 2015-04-15 DIAGNOSIS — H02834 Dermatochalasis of left upper eyelid: Secondary | ICD-10-CM | POA: Diagnosis not present

## 2015-04-29 DIAGNOSIS — H40053 Ocular hypertension, bilateral: Secondary | ICD-10-CM | POA: Diagnosis not present

## 2015-05-12 DIAGNOSIS — I10 Essential (primary) hypertension: Secondary | ICD-10-CM | POA: Diagnosis not present

## 2015-05-12 DIAGNOSIS — E1122 Type 2 diabetes mellitus with diabetic chronic kidney disease: Secondary | ICD-10-CM | POA: Diagnosis not present

## 2015-05-15 DIAGNOSIS — N183 Chronic kidney disease, stage 3 (moderate): Secondary | ICD-10-CM | POA: Diagnosis not present

## 2015-05-15 DIAGNOSIS — E875 Hyperkalemia: Secondary | ICD-10-CM | POA: Diagnosis not present

## 2015-05-15 DIAGNOSIS — L89522 Pressure ulcer of left ankle, stage 2: Secondary | ICD-10-CM | POA: Diagnosis not present

## 2015-05-15 DIAGNOSIS — E1122 Type 2 diabetes mellitus with diabetic chronic kidney disease: Secondary | ICD-10-CM | POA: Diagnosis not present

## 2015-05-26 DIAGNOSIS — B351 Tinea unguium: Secondary | ICD-10-CM | POA: Diagnosis not present

## 2015-05-26 DIAGNOSIS — L851 Acquired keratosis [keratoderma] palmaris et plantaris: Secondary | ICD-10-CM | POA: Diagnosis not present

## 2015-05-26 DIAGNOSIS — E1142 Type 2 diabetes mellitus with diabetic polyneuropathy: Secondary | ICD-10-CM | POA: Diagnosis not present

## 2015-07-29 DIAGNOSIS — H02834 Dermatochalasis of left upper eyelid: Secondary | ICD-10-CM | POA: Diagnosis not present

## 2015-07-29 DIAGNOSIS — H02831 Dermatochalasis of right upper eyelid: Secondary | ICD-10-CM | POA: Diagnosis not present

## 2015-07-29 DIAGNOSIS — H40053 Ocular hypertension, bilateral: Secondary | ICD-10-CM | POA: Diagnosis not present

## 2015-07-29 DIAGNOSIS — Z961 Presence of intraocular lens: Secondary | ICD-10-CM | POA: Diagnosis not present

## 2015-08-04 DIAGNOSIS — B351 Tinea unguium: Secondary | ICD-10-CM | POA: Diagnosis not present

## 2015-08-04 DIAGNOSIS — L851 Acquired keratosis [keratoderma] palmaris et plantaris: Secondary | ICD-10-CM | POA: Diagnosis not present

## 2015-08-04 DIAGNOSIS — E1142 Type 2 diabetes mellitus with diabetic polyneuropathy: Secondary | ICD-10-CM | POA: Diagnosis not present

## 2015-08-26 DIAGNOSIS — D0422 Carcinoma in situ of skin of left ear and external auricular canal: Secondary | ICD-10-CM | POA: Diagnosis not present

## 2015-08-26 DIAGNOSIS — D225 Melanocytic nevi of trunk: Secondary | ICD-10-CM | POA: Diagnosis not present

## 2015-08-26 DIAGNOSIS — L570 Actinic keratosis: Secondary | ICD-10-CM | POA: Diagnosis not present

## 2015-08-26 DIAGNOSIS — X32XXXD Exposure to sunlight, subsequent encounter: Secondary | ICD-10-CM | POA: Diagnosis not present

## 2015-09-16 DIAGNOSIS — C44320 Squamous cell carcinoma of skin of unspecified parts of face: Secondary | ICD-10-CM | POA: Diagnosis not present

## 2015-09-28 DIAGNOSIS — Z85828 Personal history of other malignant neoplasm of skin: Secondary | ICD-10-CM | POA: Diagnosis not present

## 2015-09-28 DIAGNOSIS — Z08 Encounter for follow-up examination after completed treatment for malignant neoplasm: Secondary | ICD-10-CM | POA: Diagnosis not present

## 2015-09-28 DIAGNOSIS — L820 Inflamed seborrheic keratosis: Secondary | ICD-10-CM | POA: Diagnosis not present

## 2015-10-13 DIAGNOSIS — B351 Tinea unguium: Secondary | ICD-10-CM | POA: Diagnosis not present

## 2015-10-13 DIAGNOSIS — L851 Acquired keratosis [keratoderma] palmaris et plantaris: Secondary | ICD-10-CM | POA: Diagnosis not present

## 2015-10-13 DIAGNOSIS — E1142 Type 2 diabetes mellitus with diabetic polyneuropathy: Secondary | ICD-10-CM | POA: Diagnosis not present

## 2015-10-26 ENCOUNTER — Encounter: Payer: Self-pay | Admitting: Internal Medicine

## 2015-11-20 ENCOUNTER — Telehealth: Payer: Self-pay

## 2015-11-20 ENCOUNTER — Encounter: Payer: Self-pay | Admitting: Gastroenterology

## 2015-11-20 ENCOUNTER — Ambulatory Visit (INDEPENDENT_AMBULATORY_CARE_PROVIDER_SITE_OTHER): Payer: Medicare Other | Admitting: Gastroenterology

## 2015-11-20 DIAGNOSIS — R143 Flatulence: Secondary | ICD-10-CM | POA: Diagnosis not present

## 2015-11-20 NOTE — Telephone Encounter (Signed)
Agree  Thank you

## 2015-11-20 NOTE — Telephone Encounter (Signed)
I called Dr. Juel Burrow office and spoke to the nurse, [name] and told her [name] was very concerned about the pt today. BP 198/72 and 200/80. He was very sweaty and clammy but refused to let us call for him to go to the ED. He finally agreed to call someone, but then he told them he was on his way to see them. He still did not want [place] to call someone and get him any help. He promised to call someone if he had problems.  He said he is taking his insulin but we are not sure how compliant and if he understands.  Michael Kaufman, NP feels pt might need someone to at least help with his medications or maybe a Home Health Nurse.   [name] is aware and will see that pt is seen and this  issue is addressed.

## 2015-11-20 NOTE — Patient Instructions (Signed)
You may take Miralax as needed daily for constipation.   I have included a handout on foods that may cause issues.   Please call us if you have any worsening of problems, otherwise we will see you back as needed.

## 2015-11-20 NOTE — Assessment & Plan Note (Signed)
82 year old male with recent bout with flatulence and what appears to be possibly self-limiting constipation, now resolved at time of visit. NO GI ALARM SYMPTOMS. However, he is hypertensive today and diaphoretic. Denies any chest pain, shortness of breath, blurred vision. He states that he feels bad each morning after taking insulin and requires snacks to feel better. Upon exam, his shirt is soaked from sweat and his skin is cool to touch. He was given crackers and water to drink and with his symptoms and hypertensive state, I felt he needed ED evaluation to rule out any cardiac etiology. He refused to let us call EMS. I then offered to call a friend or family member to come take him home, as he was declining evaluation. He again refused that. He is of sound mind, alert and oriented X 4. I stressed the importance of evaluation, but he continued to decline. From a GI standpoint, we only need to see him prn.   I have asked the nursing staff to contact Dr. Juel Burrow office regarding medication regimen. I am concerned regarding compliance and possibly not taking insulin correctly. We will also request that he see his PCP next week, which he was agreeable to. Again, I discussed in detail the danger of not seeking care, and one of the nurses was present with me at this time.

## 2015-11-20 NOTE — Progress Notes (Signed)
Primary Care Physician:  Wende Neighbors, MD Primary Gastroenterologist:  Dr. Gala Romney   Chief Complaint  Patient presents with  . Gas    better for past week    HPI:   [name] is a 82 y.o. male presenting today at the request of Dr. Nevada Crane secondary to excessive flatulence. He presents today stating for the past 10 days he has had significant improvement. BMs have been back to normal for past 10 days. States he didn't feel "locked up" that bad, states he was having gas but "not all that bad", and constipation "wasn't all that bad" during episode. Has been on Miralax for the past 2 weeks to "help" with BMs.   Feels like he really would just like to have a physical exam and make sure everything is ok. States he takes insulin twice a day and afterwards he feels shaky and feels like he needs to eat, asks if he could have something to "offset this".   Believes he "may" have had a colonoscopy in remote past. No rectal bleeding. No dysphagia, lack of appetite, weight loss. Denies any chest pain, shortness of breath, headache, blurred vision. States he does not believe he took his BP medication today.   Past Medical History:  Diagnosis Date  . Arthritis   . Diabetes mellitus without complication ([place])    takes lisinopril for kidneys  . S/P knee replacement left 2011, right 2012    Past Surgical History:  Procedure Laterality Date  . BACK SURGERY  March 23, 2011   lower back   . IRRIGATION AND DEBRIDEMENT KNEE Left 07/29/2014   Procedure: IRRIGATION AND DEBRIDEMENT WITH BURSECTOMY OF LEFT  KNEE ;  Surgeon: Gaynelle Arabian, MD;  Location: [facility];  Service: Orthopedics;  Laterality: Left;  . JOINT REPLACEMENT     knee X 2   . TOTAL HIP ARTHROPLASTY  11/02/2011   Procedure: TOTAL HIP ARTHROPLASTY;  Surgeon: Gearlean Alf, MD;  Location: [facility];  Service: Orthopedics;  Laterality: Left;  . TOTAL HIP ARTHROPLASTY Right 02/24/2012   Procedure: TOTAL HIP ARTHROPLASTY;  Surgeon: Gearlean Alf, MD;  Location: [facility];  Service: Orthopedics;  Laterality: Right;    Current Outpatient Prescriptions  Medication Sig Dispense Refill  . aspirin 81 MG tablet Take 81 mg by mouth daily.    . BD SAFETYGLIDE INSULIN SYRINGE 30G X 5/16" 0.5 ML MISC USE AS DIRECTED 300 each 0  . dorzolamide-timolol (COSOPT) 22.3-6.8 MG/ML ophthalmic solution Place 1 drop into both eyes 2 (two) times daily.    . insulin NPH-insulin regular (NOVOLIN 70/30) (70-30) 100 UNIT/ML injection Inject 15 Units into the skin 2 (two) times daily with a meal.     No current facility-administered medications for this visit.     Allergies as of 11/20/2015 - Review Complete 11/20/2015  Allergen Reaction Noted  . Morphine and related Nausea And Vomiting 10/26/2011    Family History  Problem Relation Age of Onset  . Colon cancer Neg Hx     Social History   Social History  . Marital status: Divorced    Spouse name: N/A  . Number of children: N/A  . Years of education: N/A   Occupational History  . Not on file.   Social History Main Topics  . Smoking status: Former Smoker    Packs/day: 1.00    Years: 20.00    Types: Cigarettes    Quit date: 01/11/1979  . Smokeless tobacco: Former Systems developer  Quit date: 08/23/2011  . Alcohol use No  . Drug use: No  . Sexual activity: No   Other Topics Concern  . Not on file   Social History Narrative  . No narrative on file    Review of Systems: As mentioned in HPI    Physical Exam: BP (!) 198/72   Pulse (!) 54   Temp 97.7 F (36.5 C) (Oral)   Ht 5\' 11"  (1.803 m)   Wt 200 lb (90.7 kg)   BMI 27.89 kg/m Repeat BP 200/80 General:   Alert and oriented to person, place, situation. Pale-appearing. Sweat noted diffusely across back, armpits. Skin cool to touch.  Head:  Normocephalic and atraumatic. Eyes:  Without icterus, sclera clear and conjunctiva pink.  Ears:  Normal auditory acuity. Nose:  No deformity, discharge,  or lesions. Mouth:  No deformity or  lesions, oral mucosa pink.  Lungs:  Clear to auscultation bilaterally. No wheezes, rales, or rhonchi. No distress.  Heart:  S1, S2 present without murmurs appreciated.  Abdomen:  +BS, soft, non-tender and non-distended. No HSM noted. No guarding or rebound. No masses appreciated.  Rectal:  Deferred  Msk:  Kyphosis  Extremities:  Without edema. Neurologic:  Alert and  oriented x4;  grossly normal neurologically. Psych:  Alert and cooperative. Normal mood and affect.

## 2015-11-23 ENCOUNTER — Encounter: Payer: Self-pay | Admitting: Internal Medicine

## 2015-11-23 NOTE — Telephone Encounter (Signed)
Patient called back today to state that he felt much better and to apologize for how he acted on Friday. I told him he was absolutely fine and there was nothing wrong with how he acted, and we just wanted to make sure he was ok. He would like to make another office visit, stating he was "out of it" during the visit. He wanted to say thank you for how we watched out for him.   He would like to make an appointment to see me again. Erline Levine, can you put him in with me for a non-urgent appointment.

## 2015-11-23 NOTE — Telephone Encounter (Signed)
APPT MADE AND LETTER SENT, PATIENT AWARE OF DATE AND TIME OF APPT

## 2015-11-24 NOTE — Progress Notes (Signed)
CC'D TO PCP °

## 2015-11-24 NOTE — Telephone Encounter (Signed)
Per Rosendo Gros, I am mailing the AVS to pt. He was given one at the office visit, but since he feels better, just a reminder of the visit and plan.

## 2015-11-24 NOTE — Telephone Encounter (Signed)
Noted  

## 2015-12-09 DIAGNOSIS — E782 Mixed hyperlipidemia: Secondary | ICD-10-CM | POA: Diagnosis not present

## 2015-12-09 DIAGNOSIS — R7301 Impaired fasting glucose: Secondary | ICD-10-CM | POA: Diagnosis not present

## 2015-12-09 DIAGNOSIS — E785 Hyperlipidemia, unspecified: Secondary | ICD-10-CM | POA: Diagnosis not present

## 2015-12-09 DIAGNOSIS — E1122 Type 2 diabetes mellitus with diabetic chronic kidney disease: Secondary | ICD-10-CM | POA: Diagnosis not present

## 2015-12-09 DIAGNOSIS — E039 Hypothyroidism, unspecified: Secondary | ICD-10-CM | POA: Diagnosis not present

## 2015-12-09 DIAGNOSIS — N529 Male erectile dysfunction, unspecified: Secondary | ICD-10-CM | POA: Diagnosis not present

## 2015-12-09 DIAGNOSIS — I10 Essential (primary) hypertension: Secondary | ICD-10-CM | POA: Diagnosis not present

## 2015-12-09 DIAGNOSIS — E119 Type 2 diabetes mellitus without complications: Secondary | ICD-10-CM | POA: Diagnosis not present

## 2015-12-15 ENCOUNTER — Emergency Department (HOSPITAL_COMMUNITY)
Admission: EM | Admit: 2015-12-15 | Discharge: 2015-12-15 | Disposition: A | Discharge status: Home / Self Care | Payer: Medicare Other | Attending: Dermatology | Admitting: Dermatology

## 2015-12-15 ENCOUNTER — Encounter (HOSPITAL_COMMUNITY): Payer: Self-pay | Admitting: *Deleted

## 2015-12-15 DIAGNOSIS — E161 Other hypoglycemia: Secondary | ICD-10-CM | POA: Diagnosis not present

## 2015-12-15 DIAGNOSIS — Z794 Long term (current) use of insulin: Secondary | ICD-10-CM | POA: Insufficient documentation

## 2015-12-15 DIAGNOSIS — Z87891 Personal history of nicotine dependence: Secondary | ICD-10-CM | POA: Insufficient documentation

## 2015-12-15 DIAGNOSIS — E11649 Type 2 diabetes mellitus with hypoglycemia without coma: Secondary | ICD-10-CM | POA: Insufficient documentation

## 2015-12-15 DIAGNOSIS — Z79899 Other long term (current) drug therapy: Secondary | ICD-10-CM | POA: Diagnosis not present

## 2015-12-15 DIAGNOSIS — Z7982 Long term (current) use of aspirin: Secondary | ICD-10-CM | POA: Insufficient documentation

## 2015-12-15 DIAGNOSIS — Z5321 Procedure and treatment not carried out due to patient leaving prior to being seen by health care provider: Secondary | ICD-10-CM | POA: Insufficient documentation

## 2015-12-15 LAB — CBG MONITORING, ED: Glucose-Capillary: 84 mg/dL (ref 65–99)

## 2015-12-15 NOTE — ED Triage Notes (Signed)
Pt reports syncope during grocery trip this morning. He was in motorized buggy and didn't fall or hit his head. Pt reports he is diabetic and took his insulin and ate oatmeal this morning. EMS was called but pt didn't want to be transported via EMS. CBG 84 now. Pt reports he thinks his blood sugar went low.

## 2015-12-15 NOTE — ED Notes (Signed)
Called for pt, pt not in waiting room.

## 2015-12-16 ENCOUNTER — Ambulatory Visit (INDEPENDENT_AMBULATORY_CARE_PROVIDER_SITE_OTHER): Payer: Medicare Other | Admitting: Gastroenterology

## 2015-12-16 ENCOUNTER — Encounter: Payer: Self-pay | Admitting: Gastroenterology

## 2015-12-16 VITALS — BP 138/65 | HR 61 | Temp 97.8°F | Ht 71.0 in | Wt 201.8 lb

## 2015-12-16 DIAGNOSIS — R143 Flatulence: Secondary | ICD-10-CM | POA: Diagnosis not present

## 2015-12-16 NOTE — Patient Instructions (Signed)
I am glad you are doing well!  Please call us if you need anything at all.  Have wonderful Christmas!

## 2015-12-16 NOTE — Progress Notes (Signed)
Referring Provider: Celene Squibb, MD Primary Care Physician:  Wende Neighbors, MD  Primary GI: Dr. Gala Romney   Chief Complaint  Patient presents with  . Gas    doing ok now. Constipation better since stopping Iron    HPI:   [name] is an 82 y.o. male presenting today with a history of excessive flatulence, referred by Dr. Nevada Crane. He asked to come back for an appointment, as during his last visit he was hypertensive and having issues with his blood sugar. I had recommended ED evaluation, but he had declined. Today, he returns in good spirits and appears to be doing quite well.   He apologized for how he was last time, to which he was reassured no apology needed. Denies abdominal pain, constipation, N/V. Doing well since stopping iron. Flatulence has resolved. He has essentially no GI complaints today an instead is quite talkative regarding many different things non GI-related.   Past Medical History:  Diagnosis Date  . Arthritis   . Diabetes mellitus without complication ([place])    takes lisinopril for kidneys  . S/P knee replacement left 2011, right 2012    Past Surgical History:  Procedure Laterality Date  . BACK SURGERY  March 23, 2011   lower back   . IRRIGATION AND DEBRIDEMENT KNEE Left 07/29/2014   Procedure: IRRIGATION AND DEBRIDEMENT WITH BURSECTOMY OF LEFT  KNEE ;  Surgeon: Gaynelle Arabian, MD;  Location: [facility];  Service: Orthopedics;  Laterality: Left;  . JOINT REPLACEMENT     knee X 2   . TOTAL HIP ARTHROPLASTY  11/02/2011   Procedure: TOTAL HIP ARTHROPLASTY;  Surgeon: Gearlean Alf, MD;  [facility];  Service: Orthopedics;  Laterality: Left;  . TOTAL HIP ARTHROPLASTY Right 02/24/2012   Procedure: TOTAL HIP ARTHROPLASTY;  Surgeon: Gearlean Alf, MD;  [facility];  Service: Orthopedics;  Laterality: Right;    Current Outpatient Prescriptions  Medication Sig Dispense Refill  . aspirin 81 MG tablet Take 81 mg by mouth daily.    . BD SAFETYGLIDE INSULIN  SYRINGE 30G X 5/16" 0.5 ML MISC USE AS DIRECTED 300 each 0  . dorzolamide-timolol (COSOPT) 22.3-6.8 MG/ML ophthalmic solution Place 1 drop into both eyes 2 (two) times daily.    . insulin NPH-insulin regular (NOVOLIN 70/30) (70-30) 100 UNIT/ML injection Inject 15 Units into the skin 2 (two) times daily with a meal. Taking 10-15 units in the morning and 15 in the evening    . lisinopril (PRINIVIL,ZESTRIL) 2.5 MG tablet Take 1 tablet by mouth daily.     No current facility-administered medications for this visit.     Allergies as of 12/16/2015 - Review Complete 12/16/2015  Allergen Reaction Noted  . Morphine and related Nausea And Vomiting 10/26/2011    Family History  Problem Relation Age of Onset  . Colon cancer Neg Hx     Social History   Social History  . Marital status: Divorced    Spouse name: N/A  . Number of children: N/A  . Years of education: N/A   Social History Main Topics  . Smoking status: Former Smoker    Packs/day: 1.00    Years: 20.00    Types: Cigarettes    Quit date: 01/11/1979  . Smokeless tobacco: Former Systems developer    Quit date: 08/23/2011  . Alcohol use Yes     Comment: occasionally   . Drug use: No  . Sexual activity: No   Other Topics Concern  .  None   Social History Narrative  . None    Review of Systems: Gen: Denies fever, chills, anorexia. Denies fatigue, weakness, weight loss.  CV: Denies chest pain, palpitations, syncope, peripheral edema, and claudication. Resp: Denies dyspnea at rest, cough, wheezing, coughing up blood, and pleurisy. GI: see HPI  Derm: Denies rash, itching, dry skin Psych: Denies depression, anxiety, memory loss, confusion. No homicidal or suicidal ideation.  Heme: Denies bruising, bleeding, and enlarged lymph nodes.  Physical Exam: BP 138/65   Pulse 61   Temp 97.8 F (36.6 C) (Oral)   Ht 5\' 11"  (1.803 m)   Wt 201 lb 12.8 oz (91.5 kg)   BMI 28.15 kg/m  General:   Alert and oriented. No distress noted. Pleasant and  cooperative. Appears younger than stated age.  Head:  Normocephalic and atraumatic. Eyes:  Conjuctiva clear without scleral icterus. Abdomen:  +BS, soft, non-tender and non-distended. No rebound or guarding. No HSM or masses noted. Msk:  Symmetrical without gross deformities. Normal posture. Extremities:  Without edema. Neurologic:  Alert and  oriented x4;  grossly normal neurologically. Psych:  Alert and cooperative. Normal mood and affect.

## 2015-12-17 NOTE — Assessment & Plan Note (Signed)
Improved at time of visit. Constipation resolved. Doing well off iron. No concerning GI symptoms today. Advised to contact us if he has any further issues. Colonoscopy performed at some point in remote past, but no need for colonoscopy now unless concerning signs/symptoms due to age. We will see him back as needed.

## 2015-12-21 NOTE — Progress Notes (Signed)
CC'D TO PCP °

## 2015-12-22 DIAGNOSIS — E1142 Type 2 diabetes mellitus with diabetic polyneuropathy: Secondary | ICD-10-CM | POA: Diagnosis not present

## 2015-12-22 DIAGNOSIS — B351 Tinea unguium: Secondary | ICD-10-CM | POA: Diagnosis not present

## 2015-12-22 DIAGNOSIS — L851 Acquired keratosis [keratoderma] palmaris et plantaris: Secondary | ICD-10-CM | POA: Diagnosis not present

## 2015-12-29 DIAGNOSIS — I10 Essential (primary) hypertension: Secondary | ICD-10-CM | POA: Diagnosis not present

## 2015-12-29 DIAGNOSIS — N183 Chronic kidney disease, stage 3 (moderate): Secondary | ICD-10-CM | POA: Diagnosis not present

## 2015-12-29 DIAGNOSIS — E1122 Type 2 diabetes mellitus with diabetic chronic kidney disease: Secondary | ICD-10-CM | POA: Diagnosis not present

## 2015-12-29 DIAGNOSIS — E875 Hyperkalemia: Secondary | ICD-10-CM | POA: Diagnosis not present

## 2016-01-13 ENCOUNTER — Telehealth: Payer: Self-pay

## 2016-02-15 DIAGNOSIS — E1122 Type 2 diabetes mellitus with diabetic chronic kidney disease: Secondary | ICD-10-CM | POA: Diagnosis not present

## 2016-02-15 DIAGNOSIS — N183 Chronic kidney disease, stage 3 (moderate): Secondary | ICD-10-CM | POA: Diagnosis not present

## 2016-03-01 DIAGNOSIS — B351 Tinea unguium: Secondary | ICD-10-CM | POA: Diagnosis not present

## 2016-03-01 DIAGNOSIS — E1142 Type 2 diabetes mellitus with diabetic polyneuropathy: Secondary | ICD-10-CM | POA: Diagnosis not present

## 2016-03-01 DIAGNOSIS — L851 Acquired keratosis [keratoderma] palmaris et plantaris: Secondary | ICD-10-CM | POA: Diagnosis not present

## 2016-03-01 DIAGNOSIS — S91001A Unspecified open wound, right ankle, initial encounter: Secondary | ICD-10-CM | POA: Diagnosis not present

## 2016-03-08 DIAGNOSIS — S91001D Unspecified open wound, right ankle, subsequent encounter: Secondary | ICD-10-CM | POA: Diagnosis not present

## 2016-03-08 DIAGNOSIS — E1142 Type 2 diabetes mellitus with diabetic polyneuropathy: Secondary | ICD-10-CM | POA: Diagnosis not present

## 2016-03-10 DIAGNOSIS — H02831 Dermatochalasis of right upper eyelid: Secondary | ICD-10-CM | POA: Diagnosis not present

## 2016-03-10 DIAGNOSIS — H35373 Puckering of macula, bilateral: Secondary | ICD-10-CM | POA: Diagnosis not present

## 2016-03-10 DIAGNOSIS — H40053 Ocular hypertension, bilateral: Secondary | ICD-10-CM | POA: Diagnosis not present

## 2016-03-10 DIAGNOSIS — H02834 Dermatochalasis of left upper eyelid: Secondary | ICD-10-CM | POA: Diagnosis not present

## 2016-03-10 DIAGNOSIS — Z961 Presence of intraocular lens: Secondary | ICD-10-CM | POA: Diagnosis not present

## 2016-03-29 DIAGNOSIS — S91001D Unspecified open wound, right ankle, subsequent encounter: Secondary | ICD-10-CM | POA: Diagnosis not present

## 2016-03-29 DIAGNOSIS — E1142 Type 2 diabetes mellitus with diabetic polyneuropathy: Secondary | ICD-10-CM | POA: Diagnosis not present

## 2016-04-19 DIAGNOSIS — S91001D Unspecified open wound, right ankle, subsequent encounter: Secondary | ICD-10-CM | POA: Diagnosis not present

## 2016-05-10 DIAGNOSIS — B351 Tinea unguium: Secondary | ICD-10-CM | POA: Diagnosis not present

## 2016-05-10 DIAGNOSIS — E1142 Type 2 diabetes mellitus with diabetic polyneuropathy: Secondary | ICD-10-CM | POA: Diagnosis not present

## 2016-05-10 DIAGNOSIS — L851 Acquired keratosis [keratoderma] palmaris et plantaris: Secondary | ICD-10-CM | POA: Diagnosis not present

## 2016-06-24 DIAGNOSIS — E1122 Type 2 diabetes mellitus with diabetic chronic kidney disease: Secondary | ICD-10-CM | POA: Diagnosis not present

## 2016-06-24 DIAGNOSIS — I10 Essential (primary) hypertension: Secondary | ICD-10-CM | POA: Diagnosis not present

## 2016-06-28 DIAGNOSIS — E782 Mixed hyperlipidemia: Secondary | ICD-10-CM | POA: Diagnosis not present

## 2016-06-28 DIAGNOSIS — N183 Chronic kidney disease, stage 3 (moderate): Secondary | ICD-10-CM | POA: Diagnosis not present

## 2016-06-28 DIAGNOSIS — E875 Hyperkalemia: Secondary | ICD-10-CM | POA: Diagnosis not present

## 2016-06-28 DIAGNOSIS — E1122 Type 2 diabetes mellitus with diabetic chronic kidney disease: Secondary | ICD-10-CM | POA: Diagnosis not present

## 2016-07-26 DIAGNOSIS — B351 Tinea unguium: Secondary | ICD-10-CM | POA: Diagnosis not present

## 2016-07-26 DIAGNOSIS — L851 Acquired keratosis [keratoderma] palmaris et plantaris: Secondary | ICD-10-CM | POA: Diagnosis not present

## 2016-07-26 DIAGNOSIS — E1142 Type 2 diabetes mellitus with diabetic polyneuropathy: Secondary | ICD-10-CM | POA: Diagnosis not present

## 2016-09-27 DIAGNOSIS — H40053 Ocular hypertension, bilateral: Secondary | ICD-10-CM | POA: Diagnosis not present

## 2016-10-11 DIAGNOSIS — B351 Tinea unguium: Secondary | ICD-10-CM | POA: Diagnosis not present

## 2016-10-11 DIAGNOSIS — E1142 Type 2 diabetes mellitus with diabetic polyneuropathy: Secondary | ICD-10-CM | POA: Diagnosis not present

## 2016-10-11 DIAGNOSIS — L851 Acquired keratosis [keratoderma] palmaris et plantaris: Secondary | ICD-10-CM | POA: Diagnosis not present

## 2016-12-08 DIAGNOSIS — L820 Inflamed seborrheic keratosis: Secondary | ICD-10-CM | POA: Diagnosis not present

## 2016-12-08 DIAGNOSIS — D0439 Carcinoma in situ of skin of other parts of face: Secondary | ICD-10-CM | POA: Diagnosis not present

## 2016-12-08 DIAGNOSIS — X32XXXA Exposure to sunlight, initial encounter: Secondary | ICD-10-CM | POA: Diagnosis not present

## 2016-12-08 DIAGNOSIS — L570 Actinic keratosis: Secondary | ICD-10-CM | POA: Diagnosis not present

## 2016-12-08 DIAGNOSIS — D0422 Carcinoma in situ of skin of left ear and external auricular canal: Secondary | ICD-10-CM | POA: Diagnosis not present

## 2016-12-08 DIAGNOSIS — D225 Melanocytic nevi of trunk: Secondary | ICD-10-CM | POA: Diagnosis not present

## 2016-12-27 DIAGNOSIS — L851 Acquired keratosis [keratoderma] palmaris et plantaris: Secondary | ICD-10-CM | POA: Diagnosis not present

## 2016-12-27 DIAGNOSIS — E1142 Type 2 diabetes mellitus with diabetic polyneuropathy: Secondary | ICD-10-CM | POA: Diagnosis not present

## 2016-12-27 DIAGNOSIS — B351 Tinea unguium: Secondary | ICD-10-CM | POA: Diagnosis not present

## 2016-12-29 DIAGNOSIS — D0439 Carcinoma in situ of skin of other parts of face: Secondary | ICD-10-CM | POA: Diagnosis not present

## 2016-12-29 DIAGNOSIS — C44329 Squamous cell carcinoma of skin of other parts of face: Secondary | ICD-10-CM | POA: Diagnosis not present

## 2016-12-29 DIAGNOSIS — D045 Carcinoma in situ of skin of trunk: Secondary | ICD-10-CM | POA: Diagnosis not present

## 2017-02-02 DIAGNOSIS — L0100 Impetigo, unspecified: Secondary | ICD-10-CM | POA: Diagnosis not present

## 2017-03-06 DIAGNOSIS — D509 Iron deficiency anemia, unspecified: Secondary | ICD-10-CM | POA: Diagnosis not present

## 2017-03-06 DIAGNOSIS — I10 Essential (primary) hypertension: Secondary | ICD-10-CM | POA: Diagnosis not present

## 2017-03-06 DIAGNOSIS — E782 Mixed hyperlipidemia: Secondary | ICD-10-CM | POA: Diagnosis not present

## 2017-03-06 DIAGNOSIS — E875 Hyperkalemia: Secondary | ICD-10-CM | POA: Diagnosis not present

## 2017-03-06 DIAGNOSIS — N183 Chronic kidney disease, stage 3 (moderate): Secondary | ICD-10-CM | POA: Diagnosis not present

## 2017-03-06 DIAGNOSIS — E1122 Type 2 diabetes mellitus with diabetic chronic kidney disease: Secondary | ICD-10-CM | POA: Diagnosis not present

## 2017-03-07 DIAGNOSIS — B351 Tinea unguium: Secondary | ICD-10-CM | POA: Diagnosis not present

## 2017-03-07 DIAGNOSIS — E1142 Type 2 diabetes mellitus with diabetic polyneuropathy: Secondary | ICD-10-CM | POA: Diagnosis not present

## 2017-03-30 DIAGNOSIS — H02834 Dermatochalasis of left upper eyelid: Secondary | ICD-10-CM | POA: Diagnosis not present

## 2017-03-30 DIAGNOSIS — H40053 Ocular hypertension, bilateral: Secondary | ICD-10-CM | POA: Diagnosis not present

## 2017-03-30 DIAGNOSIS — Z961 Presence of intraocular lens: Secondary | ICD-10-CM | POA: Diagnosis not present

## 2017-03-30 DIAGNOSIS — H35373 Puckering of macula, bilateral: Secondary | ICD-10-CM | POA: Diagnosis not present

## 2017-03-30 DIAGNOSIS — H02831 Dermatochalasis of right upper eyelid: Secondary | ICD-10-CM | POA: Diagnosis not present

## 2017-05-02 DIAGNOSIS — H01022 Squamous blepharitis right lower eyelid: Secondary | ICD-10-CM | POA: Diagnosis not present

## 2017-05-02 DIAGNOSIS — H01025 Squamous blepharitis left lower eyelid: Secondary | ICD-10-CM | POA: Diagnosis not present

## 2017-05-02 DIAGNOSIS — H01024 Squamous blepharitis left upper eyelid: Secondary | ICD-10-CM | POA: Diagnosis not present

## 2017-05-02 DIAGNOSIS — H10023 Other mucopurulent conjunctivitis, bilateral: Secondary | ICD-10-CM | POA: Diagnosis not present

## 2017-05-02 DIAGNOSIS — H01021 Squamous blepharitis right upper eyelid: Secondary | ICD-10-CM | POA: Diagnosis not present

## 2017-05-16 DIAGNOSIS — B351 Tinea unguium: Secondary | ICD-10-CM | POA: Diagnosis not present

## 2017-05-16 DIAGNOSIS — E1142 Type 2 diabetes mellitus with diabetic polyneuropathy: Secondary | ICD-10-CM | POA: Diagnosis not present

## 2017-07-25 DIAGNOSIS — B351 Tinea unguium: Secondary | ICD-10-CM | POA: Diagnosis not present

## 2017-07-25 DIAGNOSIS — E1142 Type 2 diabetes mellitus with diabetic polyneuropathy: Secondary | ICD-10-CM | POA: Diagnosis not present

## 2017-08-01 DIAGNOSIS — Z85828 Personal history of other malignant neoplasm of skin: Secondary | ICD-10-CM | POA: Diagnosis not present

## 2017-08-01 DIAGNOSIS — D0422 Carcinoma in situ of skin of left ear and external auricular canal: Secondary | ICD-10-CM | POA: Diagnosis not present

## 2017-08-01 DIAGNOSIS — C44519 Basal cell carcinoma of skin of other part of trunk: Secondary | ICD-10-CM | POA: Diagnosis not present

## 2017-08-01 DIAGNOSIS — D0439 Carcinoma in situ of skin of other parts of face: Secondary | ICD-10-CM | POA: Diagnosis not present

## 2017-08-01 DIAGNOSIS — D485 Neoplasm of uncertain behavior of skin: Secondary | ICD-10-CM | POA: Diagnosis not present

## 2017-08-01 DIAGNOSIS — L570 Actinic keratosis: Secondary | ICD-10-CM | POA: Diagnosis not present

## 2017-08-24 DIAGNOSIS — C44329 Squamous cell carcinoma of skin of other parts of face: Secondary | ICD-10-CM | POA: Diagnosis not present

## 2017-08-24 DIAGNOSIS — C44519 Basal cell carcinoma of skin of other part of trunk: Secondary | ICD-10-CM | POA: Diagnosis not present

## 2017-09-07 DIAGNOSIS — C44329 Squamous cell carcinoma of skin of other parts of face: Secondary | ICD-10-CM | POA: Diagnosis not present

## 2017-09-07 DIAGNOSIS — D0422 Carcinoma in situ of skin of left ear and external auricular canal: Secondary | ICD-10-CM | POA: Diagnosis not present

## 2017-09-07 DIAGNOSIS — L0100 Impetigo, unspecified: Secondary | ICD-10-CM | POA: Diagnosis not present

## 2017-09-07 DIAGNOSIS — C44229 Squamous cell carcinoma of skin of left ear and external auricular canal: Secondary | ICD-10-CM | POA: Diagnosis not present

## 2017-09-21 DIAGNOSIS — L0100 Impetigo, unspecified: Secondary | ICD-10-CM | POA: Diagnosis not present

## 2017-09-21 DIAGNOSIS — L309 Dermatitis, unspecified: Secondary | ICD-10-CM | POA: Diagnosis not present

## 2017-09-21 DIAGNOSIS — D045 Carcinoma in situ of skin of trunk: Secondary | ICD-10-CM | POA: Diagnosis not present

## 2017-09-21 DIAGNOSIS — L570 Actinic keratosis: Secondary | ICD-10-CM | POA: Diagnosis not present

## 2017-09-21 DIAGNOSIS — D485 Neoplasm of uncertain behavior of skin: Secondary | ICD-10-CM | POA: Diagnosis not present

## 2017-10-03 DIAGNOSIS — B351 Tinea unguium: Secondary | ICD-10-CM | POA: Diagnosis not present

## 2017-10-03 DIAGNOSIS — E1142 Type 2 diabetes mellitus with diabetic polyneuropathy: Secondary | ICD-10-CM | POA: Diagnosis not present

## 2017-10-10 DIAGNOSIS — E113553 Type 2 diabetes mellitus with stable proliferative diabetic retinopathy, bilateral: Secondary | ICD-10-CM | POA: Diagnosis not present

## 2017-10-10 DIAGNOSIS — H401132 Primary open-angle glaucoma, bilateral, moderate stage: Secondary | ICD-10-CM | POA: Diagnosis not present

## 2017-10-10 DIAGNOSIS — Z961 Presence of intraocular lens: Secondary | ICD-10-CM | POA: Diagnosis not present

## 2017-10-10 DIAGNOSIS — E133553 Other specified diabetes mellitus with stable proliferative diabetic retinopathy, bilateral: Secondary | ICD-10-CM | POA: Diagnosis not present

## 2017-10-13 DIAGNOSIS — M1990 Unspecified osteoarthritis, unspecified site: Secondary | ICD-10-CM | POA: Diagnosis not present

## 2017-10-13 DIAGNOSIS — E1122 Type 2 diabetes mellitus with diabetic chronic kidney disease: Secondary | ICD-10-CM | POA: Diagnosis not present

## 2017-10-13 DIAGNOSIS — Z6827 Body mass index (BMI) 27.0-27.9, adult: Secondary | ICD-10-CM | POA: Diagnosis not present

## 2017-10-13 DIAGNOSIS — E782 Mixed hyperlipidemia: Secondary | ICD-10-CM | POA: Diagnosis not present

## 2017-10-13 DIAGNOSIS — N183 Chronic kidney disease, stage 3 (moderate): Secondary | ICD-10-CM | POA: Diagnosis not present

## 2017-10-13 DIAGNOSIS — H40059 Ocular hypertension, unspecified eye: Secondary | ICD-10-CM | POA: Diagnosis not present

## 2017-10-13 DIAGNOSIS — C44310 Basal cell carcinoma of skin of unspecified parts of face: Secondary | ICD-10-CM | POA: Diagnosis not present

## 2017-10-19 DIAGNOSIS — E875 Hyperkalemia: Secondary | ICD-10-CM | POA: Diagnosis not present

## 2017-10-19 DIAGNOSIS — E1122 Type 2 diabetes mellitus with diabetic chronic kidney disease: Secondary | ICD-10-CM | POA: Diagnosis not present

## 2017-10-19 DIAGNOSIS — E782 Mixed hyperlipidemia: Secondary | ICD-10-CM | POA: Diagnosis not present

## 2017-10-19 DIAGNOSIS — D509 Iron deficiency anemia, unspecified: Secondary | ICD-10-CM | POA: Diagnosis not present

## 2017-10-19 DIAGNOSIS — I10 Essential (primary) hypertension: Secondary | ICD-10-CM | POA: Diagnosis not present

## 2017-10-19 DIAGNOSIS — N183 Chronic kidney disease, stage 3 (moderate): Secondary | ICD-10-CM | POA: Diagnosis not present

## 2017-11-16 DIAGNOSIS — C44529 Squamous cell carcinoma of skin of other part of trunk: Secondary | ICD-10-CM | POA: Diagnosis not present

## 2017-11-16 DIAGNOSIS — L570 Actinic keratosis: Secondary | ICD-10-CM | POA: Diagnosis not present

## 2017-12-26 DIAGNOSIS — B351 Tinea unguium: Secondary | ICD-10-CM | POA: Diagnosis not present

## 2017-12-26 DIAGNOSIS — E1142 Type 2 diabetes mellitus with diabetic polyneuropathy: Secondary | ICD-10-CM | POA: Diagnosis not present

## 2017-12-26 DIAGNOSIS — L97511 Non-pressure chronic ulcer of other part of right foot limited to breakdown of skin: Secondary | ICD-10-CM | POA: Diagnosis not present

## 2018-03-06 DIAGNOSIS — H0102A Squamous blepharitis right eye, upper and lower eyelids: Secondary | ICD-10-CM | POA: Diagnosis not present

## 2018-03-06 DIAGNOSIS — B351 Tinea unguium: Secondary | ICD-10-CM | POA: Diagnosis not present

## 2018-03-06 DIAGNOSIS — E1142 Type 2 diabetes mellitus with diabetic polyneuropathy: Secondary | ICD-10-CM | POA: Diagnosis not present

## 2018-03-06 DIAGNOSIS — H0102B Squamous blepharitis left eye, upper and lower eyelids: Secondary | ICD-10-CM | POA: Diagnosis not present

## 2018-05-24 DIAGNOSIS — Z0000 Encounter for general adult medical examination without abnormal findings: Secondary | ICD-10-CM | POA: Diagnosis not present

## 2018-07-24 DIAGNOSIS — E1142 Type 2 diabetes mellitus with diabetic polyneuropathy: Secondary | ICD-10-CM | POA: Diagnosis not present

## 2018-07-24 DIAGNOSIS — B351 Tinea unguium: Secondary | ICD-10-CM | POA: Diagnosis not present

## 2018-08-09 ENCOUNTER — Other Ambulatory Visit: Payer: Self-pay

## 2018-10-03 DIAGNOSIS — E113553 Type 2 diabetes mellitus with stable proliferative diabetic retinopathy, bilateral: Secondary | ICD-10-CM | POA: Diagnosis not present

## 2018-10-03 DIAGNOSIS — Z961 Presence of intraocular lens: Secondary | ICD-10-CM | POA: Diagnosis not present

## 2018-10-03 DIAGNOSIS — H401132 Primary open-angle glaucoma, bilateral, moderate stage: Secondary | ICD-10-CM | POA: Diagnosis not present

## 2018-10-09 DIAGNOSIS — E1142 Type 2 diabetes mellitus with diabetic polyneuropathy: Secondary | ICD-10-CM | POA: Diagnosis not present

## 2018-10-09 DIAGNOSIS — B351 Tinea unguium: Secondary | ICD-10-CM | POA: Diagnosis not present

## 2018-12-18 DIAGNOSIS — B351 Tinea unguium: Secondary | ICD-10-CM | POA: Diagnosis not present

## 2018-12-18 DIAGNOSIS — E1142 Type 2 diabetes mellitus with diabetic polyneuropathy: Secondary | ICD-10-CM | POA: Diagnosis not present

## 2019-01-21 ENCOUNTER — Other Ambulatory Visit: Payer: Self-pay

## 2019-01-21 ENCOUNTER — Ambulatory Visit: Payer: Medicare Other | Attending: Internal Medicine

## 2019-01-21 DIAGNOSIS — Z20822 Contact with and (suspected) exposure to covid-19: Secondary | ICD-10-CM | POA: Diagnosis not present

## 2019-01-22 LAB — NOVEL CORONAVIRUS, NAA: SARS-CoV-2, NAA: NOT DETECTED

## 2019-02-07 ENCOUNTER — Ambulatory Visit: Payer: Medicare Other

## 2019-02-16 ENCOUNTER — Ambulatory Visit: Payer: Medicare Other | Attending: Internal Medicine

## 2019-02-16 DIAGNOSIS — Z23 Encounter for immunization: Secondary | ICD-10-CM | POA: Insufficient documentation

## 2019-02-16 NOTE — Progress Notes (Signed)
   Covid-19 Vaccination Clinic  Name:  [name]    MRN: [id number] DOB: 1932-10-07  02/16/2019  [name] was observed post Covid-19 immunization for 15 minutes without incidence. He was provided with Vaccine Information Sheet and instruction to access the V-Safe system.   [name] was instructed to call 911 with any severe reactions post vaccine: Marland Kitchen Difficulty breathing  . Swelling of your face and throat  . A fast heartbeat  . A bad rash all over your body  . Dizziness and weakness    Immunizations Administered    Name Date Dose VIS Date Route   Pfizer COVID-19 Vaccine 02/16/2019 12:40 PM 0.3 mL 12/21/2018 Intramuscular   Manufacturer: [place]   Lot: [id number]   [place]: [id number]

## 2019-02-18 ENCOUNTER — Ambulatory Visit: Payer: Medicare Other

## 2019-02-20 DIAGNOSIS — N1831 Chronic kidney disease, stage 3a: Secondary | ICD-10-CM | POA: Diagnosis not present

## 2019-02-20 DIAGNOSIS — Z6827 Body mass index (BMI) 27.0-27.9, adult: Secondary | ICD-10-CM | POA: Diagnosis not present

## 2019-02-20 DIAGNOSIS — C44310 Basal cell carcinoma of skin of unspecified parts of face: Secondary | ICD-10-CM | POA: Diagnosis not present

## 2019-02-20 DIAGNOSIS — E782 Mixed hyperlipidemia: Secondary | ICD-10-CM | POA: Diagnosis not present

## 2019-02-20 DIAGNOSIS — H40059 Ocular hypertension, unspecified eye: Secondary | ICD-10-CM | POA: Diagnosis not present

## 2019-02-20 DIAGNOSIS — M1990 Unspecified osteoarthritis, unspecified site: Secondary | ICD-10-CM | POA: Diagnosis not present

## 2019-02-20 DIAGNOSIS — E1122 Type 2 diabetes mellitus with diabetic chronic kidney disease: Secondary | ICD-10-CM | POA: Diagnosis not present

## 2019-03-12 DIAGNOSIS — B351 Tinea unguium: Secondary | ICD-10-CM | POA: Diagnosis not present

## 2019-03-12 DIAGNOSIS — E1142 Type 2 diabetes mellitus with diabetic polyneuropathy: Secondary | ICD-10-CM | POA: Diagnosis not present

## 2019-03-13 ENCOUNTER — Ambulatory Visit: Payer: Medicare Other | Attending: Internal Medicine

## 2019-03-13 DIAGNOSIS — Z23 Encounter for immunization: Secondary | ICD-10-CM | POA: Insufficient documentation

## 2019-03-13 NOTE — Progress Notes (Signed)
   Covid-19 Vaccination Clinic  Name:  [name]    MRN: [id number] DOB: 30-Oct-1932  03/13/2019  [name] was observed post Covid-19 immunization for 15 minutes without incident. He was provided with Vaccine Information Sheet and instruction to access the V-Safe system.   [name] was instructed to call 911 with any severe reactions post vaccine: Marland Kitchen Difficulty breathing  . Swelling of face and throat  . A fast heartbeat  . A bad rash all over body  . Dizziness and weakness   Immunizations Administered    Name Date Dose VIS Date Route   Pfizer COVID-19 Vaccine 03/13/2019  9:29 AM 0.3 mL 12/21/2018 Intramuscular   Manufacturer: [place]   Lot: [id number]   [place]: [id number]

## 2019-04-01 DIAGNOSIS — E1122 Type 2 diabetes mellitus with diabetic chronic kidney disease: Secondary | ICD-10-CM | POA: Diagnosis not present

## 2019-04-01 DIAGNOSIS — I10 Essential (primary) hypertension: Secondary | ICD-10-CM | POA: Diagnosis not present

## 2019-04-01 DIAGNOSIS — E782 Mixed hyperlipidemia: Secondary | ICD-10-CM | POA: Diagnosis not present

## 2019-04-01 DIAGNOSIS — M1990 Unspecified osteoarthritis, unspecified site: Secondary | ICD-10-CM | POA: Diagnosis not present

## 2019-04-01 DIAGNOSIS — E875 Hyperkalemia: Secondary | ICD-10-CM | POA: Diagnosis not present

## 2019-04-01 DIAGNOSIS — Z6827 Body mass index (BMI) 27.0-27.9, adult: Secondary | ICD-10-CM | POA: Diagnosis not present

## 2019-04-01 DIAGNOSIS — Z712 Person consulting for explanation of examination or test findings: Secondary | ICD-10-CM | POA: Diagnosis not present

## 2019-04-01 DIAGNOSIS — H40059 Ocular hypertension, unspecified eye: Secondary | ICD-10-CM | POA: Diagnosis not present

## 2019-04-01 DIAGNOSIS — C44310 Basal cell carcinoma of skin of unspecified parts of face: Secondary | ICD-10-CM | POA: Diagnosis not present

## 2019-04-01 DIAGNOSIS — N1831 Chronic kidney disease, stage 3a: Secondary | ICD-10-CM | POA: Diagnosis not present

## 2019-04-01 DIAGNOSIS — Z125 Encounter for screening for malignant neoplasm of prostate: Secondary | ICD-10-CM | POA: Diagnosis not present

## 2019-04-01 DIAGNOSIS — D509 Iron deficiency anemia, unspecified: Secondary | ICD-10-CM | POA: Diagnosis not present

## 2019-04-02 DIAGNOSIS — H401132 Primary open-angle glaucoma, bilateral, moderate stage: Secondary | ICD-10-CM | POA: Diagnosis not present

## 2019-04-02 DIAGNOSIS — H04123 Dry eye syndrome of bilateral lacrimal glands: Secondary | ICD-10-CM | POA: Diagnosis not present

## 2019-04-08 DIAGNOSIS — Z6827 Body mass index (BMI) 27.0-27.9, adult: Secondary | ICD-10-CM | POA: Diagnosis not present

## 2019-04-08 DIAGNOSIS — E1122 Type 2 diabetes mellitus with diabetic chronic kidney disease: Secondary | ICD-10-CM | POA: Diagnosis not present

## 2019-04-08 DIAGNOSIS — C44310 Basal cell carcinoma of skin of unspecified parts of face: Secondary | ICD-10-CM | POA: Diagnosis not present

## 2019-04-08 DIAGNOSIS — Z0001 Encounter for general adult medical examination with abnormal findings: Secondary | ICD-10-CM | POA: Diagnosis not present

## 2019-04-08 DIAGNOSIS — N1831 Chronic kidney disease, stage 3a: Secondary | ICD-10-CM | POA: Diagnosis not present

## 2019-04-08 DIAGNOSIS — R972 Elevated prostate specific antigen [PSA]: Secondary | ICD-10-CM | POA: Diagnosis not present

## 2019-04-08 DIAGNOSIS — E782 Mixed hyperlipidemia: Secondary | ICD-10-CM | POA: Diagnosis not present

## 2019-04-08 DIAGNOSIS — M1990 Unspecified osteoarthritis, unspecified site: Secondary | ICD-10-CM | POA: Diagnosis not present

## 2019-04-08 DIAGNOSIS — H40059 Ocular hypertension, unspecified eye: Secondary | ICD-10-CM | POA: Diagnosis not present

## 2019-05-21 DIAGNOSIS — B351 Tinea unguium: Secondary | ICD-10-CM | POA: Diagnosis not present

## 2019-05-21 DIAGNOSIS — E1142 Type 2 diabetes mellitus with diabetic polyneuropathy: Secondary | ICD-10-CM | POA: Diagnosis not present

## 2019-09-17 DIAGNOSIS — E1142 Type 2 diabetes mellitus with diabetic polyneuropathy: Secondary | ICD-10-CM | POA: Diagnosis not present

## 2019-09-17 DIAGNOSIS — B351 Tinea unguium: Secondary | ICD-10-CM | POA: Diagnosis not present

## 2019-10-29 DIAGNOSIS — E1122 Type 2 diabetes mellitus with diabetic chronic kidney disease: Secondary | ICD-10-CM | POA: Diagnosis not present

## 2019-10-29 DIAGNOSIS — Z6827 Body mass index (BMI) 27.0-27.9, adult: Secondary | ICD-10-CM | POA: Diagnosis not present

## 2019-10-29 DIAGNOSIS — E875 Hyperkalemia: Secondary | ICD-10-CM | POA: Diagnosis not present

## 2019-10-29 DIAGNOSIS — E782 Mixed hyperlipidemia: Secondary | ICD-10-CM | POA: Diagnosis not present

## 2019-10-29 DIAGNOSIS — Z125 Encounter for screening for malignant neoplasm of prostate: Secondary | ICD-10-CM | POA: Diagnosis not present

## 2019-10-29 DIAGNOSIS — Z712 Person consulting for explanation of examination or test findings: Secondary | ICD-10-CM | POA: Diagnosis not present

## 2019-10-29 DIAGNOSIS — M1990 Unspecified osteoarthritis, unspecified site: Secondary | ICD-10-CM | POA: Diagnosis not present

## 2019-10-29 DIAGNOSIS — I10 Essential (primary) hypertension: Secondary | ICD-10-CM | POA: Diagnosis not present

## 2019-10-29 DIAGNOSIS — N1831 Chronic kidney disease, stage 3a: Secondary | ICD-10-CM | POA: Diagnosis not present

## 2019-10-29 DIAGNOSIS — H40059 Ocular hypertension, unspecified eye: Secondary | ICD-10-CM | POA: Diagnosis not present

## 2019-10-29 DIAGNOSIS — C44310 Basal cell carcinoma of skin of unspecified parts of face: Secondary | ICD-10-CM | POA: Diagnosis not present

## 2019-10-29 DIAGNOSIS — D509 Iron deficiency anemia, unspecified: Secondary | ICD-10-CM | POA: Diagnosis not present

## 2019-11-07 DIAGNOSIS — Z6827 Body mass index (BMI) 27.0-27.9, adult: Secondary | ICD-10-CM | POA: Diagnosis not present

## 2019-11-07 DIAGNOSIS — R972 Elevated prostate specific antigen [PSA]: Secondary | ICD-10-CM | POA: Diagnosis not present

## 2019-11-07 DIAGNOSIS — E782 Mixed hyperlipidemia: Secondary | ICD-10-CM | POA: Diagnosis not present

## 2019-11-07 DIAGNOSIS — E11621 Type 2 diabetes mellitus with foot ulcer: Secondary | ICD-10-CM | POA: Diagnosis not present

## 2019-11-07 DIAGNOSIS — H40059 Ocular hypertension, unspecified eye: Secondary | ICD-10-CM | POA: Diagnosis not present

## 2019-11-07 DIAGNOSIS — E1122 Type 2 diabetes mellitus with diabetic chronic kidney disease: Secondary | ICD-10-CM | POA: Diagnosis not present

## 2019-11-07 DIAGNOSIS — M1990 Unspecified osteoarthritis, unspecified site: Secondary | ICD-10-CM | POA: Diagnosis not present

## 2019-11-07 DIAGNOSIS — N1831 Chronic kidney disease, stage 3a: Secondary | ICD-10-CM | POA: Diagnosis not present

## 2019-11-07 DIAGNOSIS — C44310 Basal cell carcinoma of skin of unspecified parts of face: Secondary | ICD-10-CM | POA: Diagnosis not present

## 2019-11-27 DIAGNOSIS — H401132 Primary open-angle glaucoma, bilateral, moderate stage: Secondary | ICD-10-CM | POA: Diagnosis not present

## 2019-11-27 DIAGNOSIS — H04123 Dry eye syndrome of bilateral lacrimal glands: Secondary | ICD-10-CM | POA: Diagnosis not present

## 2019-11-27 DIAGNOSIS — Z961 Presence of intraocular lens: Secondary | ICD-10-CM | POA: Diagnosis not present

## 2019-11-27 DIAGNOSIS — E113553 Type 2 diabetes mellitus with stable proliferative diabetic retinopathy, bilateral: Secondary | ICD-10-CM | POA: Diagnosis not present

## 2019-12-10 DIAGNOSIS — B351 Tinea unguium: Secondary | ICD-10-CM | POA: Diagnosis not present

## 2019-12-10 DIAGNOSIS — E1142 Type 2 diabetes mellitus with diabetic polyneuropathy: Secondary | ICD-10-CM | POA: Diagnosis not present

## 2019-12-25 DIAGNOSIS — H04123 Dry eye syndrome of bilateral lacrimal glands: Secondary | ICD-10-CM | POA: Diagnosis not present

## 2019-12-25 DIAGNOSIS — H401132 Primary open-angle glaucoma, bilateral, moderate stage: Secondary | ICD-10-CM | POA: Diagnosis not present

## 2019-12-25 DIAGNOSIS — H0102B Squamous blepharitis left eye, upper and lower eyelids: Secondary | ICD-10-CM | POA: Diagnosis not present

## 2019-12-25 DIAGNOSIS — H0102A Squamous blepharitis right eye, upper and lower eyelids: Secondary | ICD-10-CM | POA: Diagnosis not present

## 2019-12-25 DIAGNOSIS — Z961 Presence of intraocular lens: Secondary | ICD-10-CM | POA: Diagnosis not present

## 2019-12-25 DIAGNOSIS — E113553 Type 2 diabetes mellitus with stable proliferative diabetic retinopathy, bilateral: Secondary | ICD-10-CM | POA: Diagnosis not present

## 2020-03-05 DIAGNOSIS — M1990 Unspecified osteoarthritis, unspecified site: Secondary | ICD-10-CM | POA: Diagnosis not present

## 2020-03-05 DIAGNOSIS — I10 Essential (primary) hypertension: Secondary | ICD-10-CM | POA: Diagnosis not present

## 2020-03-05 DIAGNOSIS — R972 Elevated prostate specific antigen [PSA]: Secondary | ICD-10-CM | POA: Diagnosis not present

## 2020-03-05 DIAGNOSIS — N1831 Chronic kidney disease, stage 3a: Secondary | ICD-10-CM | POA: Diagnosis not present

## 2020-03-05 DIAGNOSIS — E1122 Type 2 diabetes mellitus with diabetic chronic kidney disease: Secondary | ICD-10-CM | POA: Diagnosis not present

## 2020-03-05 DIAGNOSIS — Z6827 Body mass index (BMI) 27.0-27.9, adult: Secondary | ICD-10-CM | POA: Diagnosis not present

## 2020-03-05 DIAGNOSIS — D509 Iron deficiency anemia, unspecified: Secondary | ICD-10-CM | POA: Diagnosis not present

## 2020-03-05 DIAGNOSIS — E11621 Type 2 diabetes mellitus with foot ulcer: Secondary | ICD-10-CM | POA: Diagnosis not present

## 2020-03-05 DIAGNOSIS — E782 Mixed hyperlipidemia: Secondary | ICD-10-CM | POA: Diagnosis not present

## 2020-03-05 DIAGNOSIS — C44310 Basal cell carcinoma of skin of unspecified parts of face: Secondary | ICD-10-CM | POA: Diagnosis not present

## 2020-03-05 DIAGNOSIS — E875 Hyperkalemia: Secondary | ICD-10-CM | POA: Diagnosis not present

## 2020-03-05 DIAGNOSIS — H40059 Ocular hypertension, unspecified eye: Secondary | ICD-10-CM | POA: Diagnosis not present

## 2020-03-19 DIAGNOSIS — C44310 Basal cell carcinoma of skin of unspecified parts of face: Secondary | ICD-10-CM | POA: Diagnosis not present

## 2020-03-19 DIAGNOSIS — E782 Mixed hyperlipidemia: Secondary | ICD-10-CM | POA: Diagnosis not present

## 2020-03-19 DIAGNOSIS — D508 Other iron deficiency anemias: Secondary | ICD-10-CM | POA: Diagnosis not present

## 2020-03-19 DIAGNOSIS — R972 Elevated prostate specific antigen [PSA]: Secondary | ICD-10-CM | POA: Diagnosis not present

## 2020-03-19 DIAGNOSIS — R809 Proteinuria, unspecified: Secondary | ICD-10-CM | POA: Diagnosis not present

## 2020-03-19 DIAGNOSIS — E1122 Type 2 diabetes mellitus with diabetic chronic kidney disease: Secondary | ICD-10-CM | POA: Diagnosis not present

## 2020-03-19 DIAGNOSIS — H40059 Ocular hypertension, unspecified eye: Secondary | ICD-10-CM | POA: Diagnosis not present

## 2020-03-19 DIAGNOSIS — M1990 Unspecified osteoarthritis, unspecified site: Secondary | ICD-10-CM | POA: Diagnosis not present

## 2020-03-19 DIAGNOSIS — N1831 Chronic kidney disease, stage 3a: Secondary | ICD-10-CM | POA: Diagnosis not present

## 2020-03-19 DIAGNOSIS — Z6827 Body mass index (BMI) 27.0-27.9, adult: Secondary | ICD-10-CM | POA: Diagnosis not present

## 2020-03-19 DIAGNOSIS — E11621 Type 2 diabetes mellitus with foot ulcer: Secondary | ICD-10-CM | POA: Diagnosis not present

## 2020-04-03 DIAGNOSIS — R972 Elevated prostate specific antigen [PSA]: Secondary | ICD-10-CM | POA: Diagnosis not present

## 2020-04-03 DIAGNOSIS — H40059 Ocular hypertension, unspecified eye: Secondary | ICD-10-CM | POA: Diagnosis not present

## 2020-04-03 DIAGNOSIS — N1831 Chronic kidney disease, stage 3a: Secondary | ICD-10-CM | POA: Diagnosis not present

## 2020-04-03 DIAGNOSIS — C44310 Basal cell carcinoma of skin of unspecified parts of face: Secondary | ICD-10-CM | POA: Diagnosis not present

## 2020-04-03 DIAGNOSIS — E1122 Type 2 diabetes mellitus with diabetic chronic kidney disease: Secondary | ICD-10-CM | POA: Diagnosis not present

## 2020-04-03 DIAGNOSIS — E875 Hyperkalemia: Secondary | ICD-10-CM | POA: Diagnosis not present

## 2020-04-03 DIAGNOSIS — I10 Essential (primary) hypertension: Secondary | ICD-10-CM | POA: Diagnosis not present

## 2020-04-03 DIAGNOSIS — R809 Proteinuria, unspecified: Secondary | ICD-10-CM | POA: Diagnosis not present

## 2020-04-03 DIAGNOSIS — D508 Other iron deficiency anemias: Secondary | ICD-10-CM | POA: Diagnosis not present

## 2020-04-03 DIAGNOSIS — Z6827 Body mass index (BMI) 27.0-27.9, adult: Secondary | ICD-10-CM | POA: Diagnosis not present

## 2020-04-03 DIAGNOSIS — E782 Mixed hyperlipidemia: Secondary | ICD-10-CM | POA: Diagnosis not present

## 2020-04-03 DIAGNOSIS — D509 Iron deficiency anemia, unspecified: Secondary | ICD-10-CM | POA: Diagnosis not present

## 2020-05-26 DIAGNOSIS — H401132 Primary open-angle glaucoma, bilateral, moderate stage: Secondary | ICD-10-CM | POA: Diagnosis not present

## 2020-05-29 DIAGNOSIS — E1142 Type 2 diabetes mellitus with diabetic polyneuropathy: Secondary | ICD-10-CM | POA: Diagnosis not present

## 2020-05-29 DIAGNOSIS — B351 Tinea unguium: Secondary | ICD-10-CM | POA: Diagnosis not present

## 2020-07-11 DIAGNOSIS — Z20822 Contact with and (suspected) exposure to covid-19: Secondary | ICD-10-CM | POA: Diagnosis not present

## 2020-07-21 DIAGNOSIS — H9313 Tinnitus, bilateral: Secondary | ICD-10-CM | POA: Insufficient documentation

## 2020-07-28 DIAGNOSIS — S91109A Unspecified open wound of unspecified toe(s) without damage to nail, initial encounter: Secondary | ICD-10-CM | POA: Diagnosis not present

## 2020-07-28 DIAGNOSIS — Z20822 Contact with and (suspected) exposure to covid-19: Secondary | ICD-10-CM | POA: Diagnosis not present

## 2020-07-28 DIAGNOSIS — E1142 Type 2 diabetes mellitus with diabetic polyneuropathy: Secondary | ICD-10-CM | POA: Diagnosis not present

## 2020-07-28 DIAGNOSIS — S90819A Abrasion, unspecified foot, initial encounter: Secondary | ICD-10-CM | POA: Diagnosis not present

## 2020-08-05 DIAGNOSIS — R296 Repeated falls: Secondary | ICD-10-CM | POA: Diagnosis not present

## 2020-08-05 DIAGNOSIS — F329 Major depressive disorder, single episode, unspecified: Secondary | ICD-10-CM | POA: Diagnosis not present

## 2020-08-05 DIAGNOSIS — R262 Difficulty in walking, not elsewhere classified: Secondary | ICD-10-CM | POA: Diagnosis not present

## 2020-08-07 DIAGNOSIS — R319 Hematuria, unspecified: Secondary | ICD-10-CM | POA: Diagnosis not present

## 2020-08-07 DIAGNOSIS — R4181 Age-related cognitive decline: Secondary | ICD-10-CM | POA: Diagnosis not present

## 2020-08-21 DIAGNOSIS — B351 Tinea unguium: Secondary | ICD-10-CM | POA: Diagnosis not present

## 2020-08-21 DIAGNOSIS — E1142 Type 2 diabetes mellitus with diabetic polyneuropathy: Secondary | ICD-10-CM | POA: Diagnosis not present

## 2020-10-13 DIAGNOSIS — N183 Chronic kidney disease, stage 3 unspecified: Secondary | ICD-10-CM | POA: Diagnosis not present

## 2020-10-13 DIAGNOSIS — I10 Essential (primary) hypertension: Secondary | ICD-10-CM | POA: Diagnosis not present

## 2020-10-13 DIAGNOSIS — Z20822 Contact with and (suspected) exposure to covid-19: Secondary | ICD-10-CM | POA: Diagnosis not present

## 2020-10-13 DIAGNOSIS — E611 Iron deficiency: Secondary | ICD-10-CM | POA: Diagnosis not present

## 2020-10-22 DIAGNOSIS — Z23 Encounter for immunization: Secondary | ICD-10-CM | POA: Diagnosis not present

## 2020-11-20 DIAGNOSIS — B351 Tinea unguium: Secondary | ICD-10-CM | POA: Diagnosis not present

## 2020-11-20 DIAGNOSIS — E1142 Type 2 diabetes mellitus with diabetic polyneuropathy: Secondary | ICD-10-CM | POA: Diagnosis not present

## 2020-11-24 DIAGNOSIS — H0102B Squamous blepharitis left eye, upper and lower eyelids: Secondary | ICD-10-CM | POA: Diagnosis not present

## 2020-11-24 DIAGNOSIS — H04123 Dry eye syndrome of bilateral lacrimal glands: Secondary | ICD-10-CM | POA: Diagnosis not present

## 2020-11-24 DIAGNOSIS — E113553 Type 2 diabetes mellitus with stable proliferative diabetic retinopathy, bilateral: Secondary | ICD-10-CM | POA: Diagnosis not present

## 2020-11-24 DIAGNOSIS — H401132 Primary open-angle glaucoma, bilateral, moderate stage: Secondary | ICD-10-CM | POA: Diagnosis not present

## 2020-11-24 DIAGNOSIS — H0102A Squamous blepharitis right eye, upper and lower eyelids: Secondary | ICD-10-CM | POA: Diagnosis not present

## 2020-11-24 DIAGNOSIS — Z961 Presence of intraocular lens: Secondary | ICD-10-CM | POA: Diagnosis not present

## 2020-11-25 DIAGNOSIS — L97509 Non-pressure chronic ulcer of other part of unspecified foot with unspecified severity: Secondary | ICD-10-CM | POA: Diagnosis not present

## 2020-11-25 DIAGNOSIS — R972 Elevated prostate specific antigen [PSA]: Secondary | ICD-10-CM | POA: Diagnosis not present

## 2020-11-25 DIAGNOSIS — N1831 Chronic kidney disease, stage 3a: Secondary | ICD-10-CM | POA: Diagnosis not present

## 2020-11-25 DIAGNOSIS — Z0001 Encounter for general adult medical examination with abnormal findings: Secondary | ICD-10-CM | POA: Diagnosis not present

## 2020-11-25 DIAGNOSIS — H40059 Ocular hypertension, unspecified eye: Secondary | ICD-10-CM | POA: Diagnosis not present

## 2020-11-25 DIAGNOSIS — E1122 Type 2 diabetes mellitus with diabetic chronic kidney disease: Secondary | ICD-10-CM | POA: Diagnosis not present

## 2020-11-25 DIAGNOSIS — E875 Hyperkalemia: Secondary | ICD-10-CM | POA: Diagnosis not present

## 2020-11-25 DIAGNOSIS — E782 Mixed hyperlipidemia: Secondary | ICD-10-CM | POA: Diagnosis not present

## 2020-11-25 DIAGNOSIS — M1990 Unspecified osteoarthritis, unspecified site: Secondary | ICD-10-CM | POA: Diagnosis not present

## 2020-11-25 DIAGNOSIS — C4491 Basal cell carcinoma of skin, unspecified: Secondary | ICD-10-CM | POA: Diagnosis not present

## 2020-12-15 DIAGNOSIS — Z20822 Contact with and (suspected) exposure to covid-19: Secondary | ICD-10-CM | POA: Diagnosis not present

## 2021-01-22 DIAGNOSIS — Z20822 Contact with and (suspected) exposure to covid-19: Secondary | ICD-10-CM | POA: Diagnosis not present

## 2021-01-26 ENCOUNTER — Ambulatory Visit (INDEPENDENT_AMBULATORY_CARE_PROVIDER_SITE_OTHER): Payer: Medicare Other | Admitting: Family Medicine

## 2021-01-26 ENCOUNTER — Encounter: Payer: Self-pay | Admitting: Family Medicine

## 2021-01-26 VITALS — BP 109/58 | HR 75 | Temp 98.7°F | Ht 71.0 in | Wt 207.2 lb

## 2021-01-26 DIAGNOSIS — M159 Polyosteoarthritis, unspecified: Secondary | ICD-10-CM | POA: Diagnosis not present

## 2021-01-26 DIAGNOSIS — E119 Type 2 diabetes mellitus without complications: Secondary | ICD-10-CM

## 2021-01-26 DIAGNOSIS — Z794 Long term (current) use of insulin: Secondary | ICD-10-CM

## 2021-01-26 NOTE — Progress Notes (Signed)
Subjective:  Patient ID: [name], male    DOB: Jun 20, 1932  Age: 88 y.o. MRN: [id number]  CC: New Patient (Initial Visit)   HPI DNAIEL VOLLER presents forFollow-up of diabetes. Patient checks blood sugar at home.   120 fasting and 168 postprandial. Using insulin 70/30 at 17 units BID. Had been15 units unti 2 months ago.  Patient tells me he is a type II.  However he has never taken pills.  He is always taken insulin and says that insulin is all he can take for his condition.. Patient denies symptoms such as polyuria, polydipsia, excessive hunger, nausea No significant hypoglycemic spells noted. Medications reviewed. Pt reports taking them regularly without complication/adverse reaction being reported today.  He had an A1c done 2 months ago with his previous physician.  He does not remember the number.  His son who is with him today also does not remember that number.  Records are pending from his previous physician.  Patient also has a great deal of back pain.  He has had some surgery in the past.  He uses a walker for ambulation.  Today he is not interested in taking medication for his arthritis discomfort.  History Olden has a past medical history of Arthritis, Diabetes mellitus without complication ([place]), and S/P knee replacement (left 2011, right 2012).   He has a past surgical history that includes Joint replacement; Back surgery (March 23, 2011); Total hip arthroplasty (11/02/2011); Total hip arthroplasty (Right, 02/24/2012); and Irrigation and debridement knee (Left, 07/29/2014).   His family history is not on file.He reports that he quit smoking about 42 years ago. His smoking use included cigarettes. He has a 20.00 pack-year smoking history. He quit smokeless tobacco use about 9 years ago. He reports that he does not currently use alcohol. He reports that he does not use drugs.  Current Outpatient Medications on File Prior to Visit  Medication Sig Dispense Refill    dorzolamide-timolol (COSOPT) 22.3-6.8 MG/ML ophthalmic solution Place 1 drop into both eyes 2 (two) times daily.     insulin NPH-insulin regular (NOVOLIN 70/30) (70-30) 100 UNIT/ML injection Inject 15 Units into the skin 2 (two) times daily with a meal. Taking 10-15 units in the morning and 15 in the evening     No current facility-administered medications on file prior to visit.    ROS Review of Systems  Constitutional: Negative.   HENT: Negative.    Eyes:  Negative for visual disturbance.  Respiratory:  Negative for cough and shortness of breath.   Cardiovascular:  Negative for chest pain and leg swelling.  Gastrointestinal:  Negative for abdominal pain, diarrhea, nausea and vomiting.  Genitourinary:  Negative for difficulty urinating.  Musculoskeletal:  Positive for arthralgias. Negative for myalgias.  Skin:  Negative for rash.  Neurological:  Negative for headaches.  Psychiatric/Behavioral:  Negative for sleep disturbance.    Objective:  BP (!) 109/58    Pulse 75    Temp 98.7 F (37.1 C)    Ht 5\' 11"  (1.803 m)    Wt 207 lb 3.2 oz (94 kg)    SpO2 99%    BMI 28.90 kg/m   BP Readings from Last 3 Encounters:  01/26/21 (!) 109/58  12/16/15 138/65  12/15/15 135/99    Wt Readings from Last 3 Encounters:  01/26/21 207 lb 3.2 oz (94 kg)  12/16/15 201 lb 12.8 oz (91.5 kg)  12/15/15 200 lb (90.7 kg)     Physical Exam Constitutional:  General: He is not in acute distress.    Appearance: He is well-developed.  HENT:     Head: Normocephalic and atraumatic.     Right Ear: External ear normal.     Left Ear: External ear normal.     Nose: Nose normal.  Eyes:     Conjunctiva/sclera: Conjunctivae normal.     Pupils: Pupils are equal, round, and reactive to light.  Cardiovascular:     Rate and Rhythm: Normal rate and regular rhythm.     Heart sounds: Normal heart sounds. No murmur heard. Pulmonary:     Effort: Pulmonary effort is normal. No respiratory distress.     Breath  sounds: Normal breath sounds. No wheezing or rales.  Abdominal:     Palpations: Abdomen is soft.     Tenderness: There is no abdominal tenderness.  Musculoskeletal:        General: Deformity present. Normal range of motion.     Cervical back: Normal range of motion and neck supple.  Skin:    General: Skin is warm and dry.  Neurological:     Mental Status: He is alert and oriented to person, place, and time.     Deep Tendon Reflexes: Reflexes are normal and symmetric.  Psychiatric:        Behavior: Behavior normal.        Thought Content: Thought content normal.        Judgment: Judgment normal.  Stooped posture osteoarthrosis    Assessment & Plan:   Zacherie was seen today for new patient (initial visit).  Diagnoses and all orders for this visit:  Diabetes mellitus type 2, insulin dependent ([place])  Primary osteoarthritis involving multiple joints      I have discontinued Qamar R. Janish's aspirin, BD SafetyGlide Insulin Syringe, and lisinopril. I am also having him maintain his insulin NPH-regular Human and dorzolamide-timolol.  No orders of the defined types were placed in this encounter.    Follow-up: Return in about 1 month (around 02/26/2021).  Claretta Fraise, M.D.

## 2021-02-25 DIAGNOSIS — B351 Tinea unguium: Secondary | ICD-10-CM | POA: Diagnosis not present

## 2021-02-25 DIAGNOSIS — E1142 Type 2 diabetes mellitus with diabetic polyneuropathy: Secondary | ICD-10-CM | POA: Diagnosis not present

## 2021-03-23 DIAGNOSIS — Z20822 Contact with and (suspected) exposure to covid-19: Secondary | ICD-10-CM | POA: Diagnosis not present

## 2021-04-01 ENCOUNTER — Ambulatory Visit (INDEPENDENT_AMBULATORY_CARE_PROVIDER_SITE_OTHER): Payer: Medicare Other | Admitting: Family Medicine

## 2021-04-01 ENCOUNTER — Encounter: Payer: Self-pay | Admitting: Family Medicine

## 2021-04-01 VITALS — BP 128/54 | HR 54 | Temp 97.5°F | Ht 71.0 in | Wt 204.4 lb

## 2021-04-01 DIAGNOSIS — E119 Type 2 diabetes mellitus without complications: Secondary | ICD-10-CM

## 2021-04-01 DIAGNOSIS — Z13220 Encounter for screening for lipoid disorders: Secondary | ICD-10-CM

## 2021-04-01 LAB — BAYER DCA HB A1C WAIVED: HB A1C (BAYER DCA - WAIVED): 6.8 % — ABNORMAL HIGH (ref 4.8–5.6)

## 2021-04-01 MED ORDER — INSULIN NPH ISOPHANE & REGULAR (70-30) 100 UNIT/ML ~~LOC~~ SUSP: SUBCUTANEOUS | 5 refills | Status: DC

## 2021-04-01 MED ORDER — "BD INSULIN SYRINGE U/F 30G X 1/2"" 0.5 ML MISC": 11 refills | Status: DC

## 2021-04-01 NOTE — Progress Notes (Signed)
? ?Subjective:  ?Patient ID: [name], male    DOB: 1932-08-14  Age: 88 y.o. MRN: [id number] ? ?CC: Medical Management of Chronic Issues ? ? ?HPI ?[name] presents forFollow-up of diabetes. Patient checks blood sugar at home.  ? 60-150 fasting and  postprandial ?Patient denies symptoms such as polyuria, polydipsia, excessive hunger, nausea ?Several hypoglycemic spells noted. ?Medications reviewed. Pt reports taking them regularly without complication/adverse reaction being reported today.  ?Recently quit eating bread and corn ? ?History ?[name] has a past medical history of Arthritis, Diabetes mellitus without complication ([place]), and S/P knee replacement (left 2011, right 2012).  ? ?[name] has a past surgical history that includes Joint replacement; Back surgery (March 23, 2011); Total hip arthroplasty (11/02/2011); Total hip arthroplasty (Right, 02/24/2012); and Irrigation and debridement knee (Left, 07/29/2014).  ? ?His family history is not on file.[name] reports that [name] quit smoking about 42 years ago. His smoking use included cigarettes. [name] has a 20.00 pack-year smoking history. [name] quit smokeless tobacco use about 9 years ago. [name] reports that [name] does not currently use alcohol. [name] reports that [name] does not use drugs. ? ?Current Outpatient Medications on File Prior to Visit  ?Medication Sig Dispense Refill  ? dorzolamide-timolol (COSOPT) 22.3-6.8 MG/ML ophthalmic solution Place 1 drop into both eyes 2 (two) times daily.    ? ?No current facility-administered medications on file prior to visit.  ? ? ?ROS ?Review of Systems  ?Constitutional:  Negative for fever.  ?Respiratory:  Negative for shortness of breath.   ?Cardiovascular:  Negative for chest pain.  ?Musculoskeletal:  Negative for arthralgias.  ?Skin:  Negative for rash.  ? ?Objective:  ?BP (!) 128/54   Pulse (!) 54   Temp (!) 97.5 ?F (36.4 ?C)   Ht 5' 11" (1.803 m)   Wt 204 lb 6.4 oz (92.7 kg)   SpO2 99%   BMI 28.51 kg/m?  ? ?BP Readings from  Last 3 Encounters:  ?04/01/21 (!) 128/54  ?01/26/21 (!) 109/58  ?12/16/15 138/65  ? ? ?Wt Readings from Last 3 Encounters:  ?04/01/21 204 lb 6.4 oz (92.7 kg)  ?01/26/21 207 lb 3.2 oz (94 kg)  ?12/16/15 201 lb 12.8 oz (91.5 kg)  ? ? ? ?Physical Exam ?Constitutional:   ?   General: [name] is not in acute distress. ?   Appearance: [name] is well-developed.  ?HENT:  ?   Head: Normocephalic and atraumatic.  ?   Right Ear: External ear normal.  ?   Left Ear: External ear normal.  ?   Nose: Nose normal.  ?Eyes:  ?   Conjunctiva/sclera: Conjunctivae normal.  ?   Pupils: Pupils are equal, round, and reactive to light.  ?Cardiovascular:  ?   Rate and Rhythm: Normal rate and regular rhythm.  ?   Heart sounds: Normal heart sounds. No murmur heard. ?Pulmonary:  ?   Effort: Pulmonary effort is normal. No respiratory distress.  ?   Breath sounds: Normal breath sounds. No wheezing or rales.  ?Abdominal:  ?   Palpations: Abdomen is soft.  ?   Tenderness: There is no abdominal tenderness.  ?Musculoskeletal:     ?   General: Normal range of motion.  ?   Cervical back: Normal range of motion and neck supple.  ?Skin: ?   General: Skin is warm and dry.  ?Neurological:  ?   Mental Status: [name] is alert and oriented to person, place, and time.  ?   Deep Tendon Reflexes: Reflexes are  normal and symmetric.  ?Psychiatric:     ?   Behavior: Behavior normal.     ?   Thought Content: Thought content normal.     ?   Judgment: Judgment normal.  ? ? ? ? ?Assessment & Plan:  ? ?[name] was seen today for medical management of chronic issues. ? ?Diagnoses and all orders for this visit: ? ?Diabetes mellitus without complication ([place]) ?-     Bayer DCA Hb A1c Waived ?-     CMP14+EGFR ?-     CBC with Differential/Platelet ? ?Lipid screening ?-     Lipid panel ? ?Other orders ?-     insulin NPH-regular Human (70-30) 100 UNIT/ML injection; Taking 15 units in the morning just before breakfast and 12 just before supper ?-     Insulin Syringe-Needle U-100 (BD INSULIN  SYRINGE U/F) 30G X 1/2" 0.5 ML MISC; 15 units before breakfast and 12 units before supper ? ? ? ? ? ?I have changed [name]'s insulin NPH-regular Human. I am also having him start on BD Insulin Syringe U/F. Additionally, I am having him maintain his dorzolamide-timolol. ? ?Meds ordered this encounter  ?Medications  ? insulin NPH-regular Human (70-30) 100 UNIT/ML injection  ?  Sig: Taking 15 units in the morning just before breakfast and 12 just before supper  ?  Dispense:  10 mL  ?  Refill:  5  ? Insulin Syringe-Needle U-100 (BD INSULIN SYRINGE U/F) 30G X 1/2" 0.5 ML MISC  ?  Sig: 15 units before breakfast and 12 units before supper  ?  Dispense:  100 each  ?  Refill:  11  ? ? ? ?Follow-up: Return in about 3 months (around 07/02/2021). ? ?Claretta Fraise, M.D. ?

## 2021-04-02 LAB — CMP14+EGFR
ALT: 11 IU/L (ref 0–44)
AST: 19 IU/L (ref 0–40)
Albumin/Globulin Ratio: 1.2 (ref 1.2–2.2)
Albumin: 4.2 g/dL (ref 3.6–4.6)
Alkaline Phosphatase: 97 IU/L (ref 44–121)
BUN/Creatinine Ratio: 18 (ref 10–24)
BUN: 26 mg/dL (ref 8–27)
Bilirubin Total: 0.4 mg/dL (ref 0.0–1.2)
CO2: 20 mmol/L (ref 20–29)
Calcium: 9.3 mg/dL (ref 8.6–10.2)
Chloride: 103 mmol/L (ref 96–106)
Creatinine, Ser: 1.47 mg/dL — ABNORMAL HIGH (ref 0.76–1.27)
Globulin, Total: 3.6 g/dL (ref 1.5–4.5)
Glucose: 46 mg/dL — ABNORMAL LOW (ref 70–99)
Potassium: 4.8 mmol/L (ref 3.5–5.2)
Sodium: 138 mmol/L (ref 134–144)
Total Protein: 7.8 g/dL (ref 6.0–8.5)
eGFR: 46 mL/min/{1.73_m2} — ABNORMAL LOW (ref >59)

## 2021-04-02 LAB — CBC WITH DIFFERENTIAL/PLATELET
Basophils Absolute: 0.1 10*3/uL (ref 0.0–0.2)
Basos: 1 %
EOS (ABSOLUTE): 0.1 10*3/uL (ref 0.0–0.4)
Eos: 2 %
Hematocrit: 35.8 % — ABNORMAL LOW (ref 37.5–51.0)
Hemoglobin: 11.8 g/dL — ABNORMAL LOW (ref 13.0–17.7)
Immature Grans (Abs): 0 10*3/uL (ref 0.0–0.1)
Immature Granulocytes: 0 %
Lymphocytes Absolute: 1.3 10*3/uL (ref 0.7–3.1)
Lymphs: 28 %
MCH: 32.7 pg (ref 26.6–33.0)
MCHC: 33 g/dL (ref 31.5–35.7)
MCV: 99 fL — ABNORMAL HIGH (ref 79–97)
Monocytes Absolute: 0.6 10*3/uL (ref 0.1–0.9)
Monocytes: 12 %
Neutrophils Absolute: 2.5 10*3/uL (ref 1.4–7.0)
Neutrophils: 57 %
Platelets: 265 10*3/uL (ref 150–450)
RBC: 3.61 x10E6/uL — ABNORMAL LOW (ref 4.14–5.80)
RDW: 12.3 % (ref 11.6–15.4)
WBC: 4.5 10*3/uL (ref 3.4–10.8)

## 2021-04-02 LAB — LIPID PANEL
Chol/HDL Ratio: 3.1 ratio (ref 0.0–5.0)
Cholesterol, Total: 142 mg/dL (ref 100–199)
HDL: 46 mg/dL (ref >39)
LDL Chol Calc (NIH): 79 mg/dL (ref 0–99)
Triglycerides: 87 mg/dL (ref 0–149)
VLDL Cholesterol Cal: 17 mg/dL (ref 5–40)

## 2021-04-04 ENCOUNTER — Encounter: Payer: Self-pay | Admitting: Family Medicine

## 2021-04-07 ENCOUNTER — Telehealth: Payer: Self-pay | Admitting: Family Medicine

## 2021-04-07 ENCOUNTER — Other Ambulatory Visit: Payer: Self-pay | Admitting: Family Medicine

## 2021-04-07 MED ORDER — IBUPROFEN 600 MG PO TABS: 600.0000 mg | ORAL_TABLET | Freq: Three times a day (TID) | ORAL | 0 refills | Status: DC | PRN

## 2021-04-07 NOTE — Telephone Encounter (Signed)
Pts son called stating that pt used to have a prescription for Ibuprofen '800mg'$  but pharmacy said he doesn't. Needs PCP to send in refills to [facility] in [place] per pt request. ?

## 2021-04-07 NOTE — Telephone Encounter (Signed)
I sent in the prescription. However, he should take it as infrequently as possible since it can be hard on the kidneys. ?

## 2021-04-15 DIAGNOSIS — Z20822 Contact with and (suspected) exposure to covid-19: Secondary | ICD-10-CM | POA: Diagnosis not present

## 2021-04-19 DIAGNOSIS — Z20828 Contact with and (suspected) exposure to other viral communicable diseases: Secondary | ICD-10-CM | POA: Diagnosis not present

## 2021-04-25 DIAGNOSIS — Z20822 Contact with and (suspected) exposure to covid-19: Secondary | ICD-10-CM | POA: Diagnosis not present

## 2021-04-30 DIAGNOSIS — Z20822 Contact with and (suspected) exposure to covid-19: Secondary | ICD-10-CM | POA: Diagnosis not present

## 2021-05-07 DIAGNOSIS — Z20822 Contact with and (suspected) exposure to covid-19: Secondary | ICD-10-CM | POA: Diagnosis not present

## 2021-05-07 DIAGNOSIS — R059 Cough, unspecified: Secondary | ICD-10-CM | POA: Diagnosis not present

## 2021-05-07 DIAGNOSIS — R051 Acute cough: Secondary | ICD-10-CM | POA: Diagnosis not present

## 2021-05-13 DIAGNOSIS — E1142 Type 2 diabetes mellitus with diabetic polyneuropathy: Secondary | ICD-10-CM | POA: Diagnosis not present

## 2021-05-13 DIAGNOSIS — B351 Tinea unguium: Secondary | ICD-10-CM | POA: Diagnosis not present

## 2021-05-14 DIAGNOSIS — Z20822 Contact with and (suspected) exposure to covid-19: Secondary | ICD-10-CM | POA: Diagnosis not present

## 2021-05-17 DIAGNOSIS — Z20822 Contact with and (suspected) exposure to covid-19: Secondary | ICD-10-CM | POA: Diagnosis not present

## 2021-05-25 DIAGNOSIS — H401132 Primary open-angle glaucoma, bilateral, moderate stage: Secondary | ICD-10-CM | POA: Diagnosis not present

## 2021-06-03 DIAGNOSIS — L820 Inflamed seborrheic keratosis: Secondary | ICD-10-CM | POA: Diagnosis not present

## 2021-06-03 DIAGNOSIS — D045 Carcinoma in situ of skin of trunk: Secondary | ICD-10-CM | POA: Diagnosis not present

## 2021-06-04 DIAGNOSIS — R5381 Other malaise: Secondary | ICD-10-CM | POA: Diagnosis not present

## 2021-06-04 DIAGNOSIS — R531 Weakness: Secondary | ICD-10-CM | POA: Diagnosis not present

## 2021-06-16 DIAGNOSIS — R61 Generalized hyperhidrosis: Secondary | ICD-10-CM | POA: Diagnosis not present

## 2021-06-16 DIAGNOSIS — E11649 Type 2 diabetes mellitus with hypoglycemia without coma: Secondary | ICD-10-CM | POA: Diagnosis not present

## 2021-06-16 DIAGNOSIS — Z794 Long term (current) use of insulin: Secondary | ICD-10-CM | POA: Diagnosis not present

## 2021-06-16 DIAGNOSIS — E162 Hypoglycemia, unspecified: Secondary | ICD-10-CM | POA: Diagnosis not present

## 2021-06-16 DIAGNOSIS — E161 Other hypoglycemia: Secondary | ICD-10-CM | POA: Diagnosis not present

## 2021-06-16 DIAGNOSIS — R4020 Unspecified coma: Secondary | ICD-10-CM | POA: Diagnosis not present

## 2021-06-17 ENCOUNTER — Telehealth: Payer: Self-pay | Admitting: Family Medicine

## 2021-06-17 NOTE — Telephone Encounter (Signed)
Spoke to DOD [employer]) - she advised that patient needs to go back to the ED for treatment; may need to run medication through IV.   Notified the son.

## 2021-06-24 ENCOUNTER — Ambulatory Visit (INDEPENDENT_AMBULATORY_CARE_PROVIDER_SITE_OTHER): Payer: Medicare Other | Admitting: Family Medicine

## 2021-06-24 ENCOUNTER — Encounter: Payer: Self-pay | Admitting: Family Medicine

## 2021-06-24 VITALS — BP 90/42 | HR 70 | Temp 97.3°F | Ht 71.0 in | Wt 203.0 lb

## 2021-06-24 DIAGNOSIS — Z13220 Encounter for screening for lipoid disorders: Secondary | ICD-10-CM | POA: Diagnosis not present

## 2021-06-24 DIAGNOSIS — E119 Type 2 diabetes mellitus without complications: Secondary | ICD-10-CM | POA: Diagnosis not present

## 2021-06-24 DIAGNOSIS — E11649 Type 2 diabetes mellitus with hypoglycemia without coma: Secondary | ICD-10-CM

## 2021-06-24 DIAGNOSIS — I952 Hypotension due to drugs: Secondary | ICD-10-CM | POA: Diagnosis not present

## 2021-06-24 LAB — BAYER DCA HB A1C WAIVED: HB A1C (BAYER DCA - WAIVED): 6.4 % — ABNORMAL HIGH (ref 4.8–5.6)

## 2021-06-24 NOTE — Progress Notes (Signed)
Subjective:  Patient ID: [name], male    DOB: Apr 21, 1932  Age: 88 y.o. MRN: [id number]  CC: Medical Management of Chronic Issues   HPI SHERIDAN GETTEL presents forFollow-up of diabetes. Patient checks blood sugar at home.   80-130 fasting and not checking postprandial Patient denies symptoms such as polyuria, polydipsia, excessive hunger, nausea AM hypoglycemic spells noted. Got incoherent when it dropped to 50 a couple of weeks ago.  Medications reviewed. Pt reports taking them regularly without complication/adverse reaction being reported today.    Historynot checking Toretto has a past medical history of Arthritis, Diabetes mellitus without complication ([place]), and S/P knee replacement (left 2011, right 2012).   He has a past surgical history that includes Joint replacement; Back surgery (March 23, 2011); Total hip arthroplasty (11/02/2011); Total hip arthroplasty (Right, 02/24/2012); and Irrigation and debridement knee (Left, 07/29/2014).   His family history is not on file.He reports that he quit smoking about 42 years ago. His smoking use included cigarettes. He has a 20.00 pack-year smoking history. He quit smokeless tobacco use about 9 years ago. He reports that he does not currently use alcohol. He reports that he does not use drugs.  Current Outpatient Medications on File Prior to Visit  Medication Sig Dispense Refill   dorzolamide-timolol (COSOPT) 22.3-6.8 MG/ML ophthalmic solution Place 1 drop into both eyes 2 (two) times daily.     ibuprofen (ADVIL) 600 MG tablet Take 1 tablet (600 mg total) by mouth 3 (three) times daily as needed. 90 tablet 0   insulin NPH-regular Human (70-30) 100 UNIT/ML injection Taking 15 units in the morning just before breakfast and 12 just before supper 10 mL 5   Insulin Syringe-Needle U-100 (BD INSULIN SYRINGE U/F) 30G X 1/2" 0.5 ML MISC 15 units before breakfast and 12 units before supper 100 each 11   No current facility-administered  medications on file prior to visit.    ROS Review of Systems  Constitutional:  Negative for fever.  Respiratory:  Negative for shortness of breath.   Cardiovascular:  Negative for chest pain.  Musculoskeletal:  Negative for arthralgias.  Skin:  Negative for rash.    Objective:  BP (!) 90/42   Pulse 70   Temp (!) 97.3 F (36.3 C)   Ht 5' 11"  (1.803 m)   Wt 203 lb (92.1 kg)   SpO2 97%   BMI 28.31 kg/m   BP Readings from Last 3 Encounters:  06/24/21 (!) 90/42  04/01/21 (!) 128/54  01/26/21 (!) 109/58    Wt Readings from Last 3 Encounters:  06/24/21 203 lb (92.1 kg)  04/01/21 204 lb 6.4 oz (92.7 kg)  01/26/21 207 lb 3.2 oz (94 kg)     Physical Exam Vitals reviewed.  Constitutional:      Appearance: He is well-developed.  HENT:     Head: Normocephalic and atraumatic.     Right Ear: External ear normal.     Left Ear: External ear normal.     Mouth/Throat:     Pharynx: No oropharyngeal exudate or posterior oropharyngeal erythema.  Eyes:     Pupils: Pupils are equal, round, and reactive to light.  Cardiovascular:     Rate and Rhythm: Normal rate and regular rhythm.     Heart sounds: No murmur heard. Pulmonary:     Effort: No respiratory distress.     Breath sounds: Normal breath sounds.  Musculoskeletal:     Cervical back: Normal range of motion and neck supple.  Neurological:     Mental Status: He is alert and oriented to person, place, and time.       Assessment & Plan:   Lakoda was seen today for medical management of chronic issues.  Diagnoses and all orders for this visit:  Lipid screening -     Lipid panel  Diabetes mellitus without complication ([place]) -     Bayer DCA Hb A1c Waived -     CBC with Differential/Platelet -     CMP14+EGFR  Diabetic hypoglycemia ([place])  Hypotension due to drugs      I am having Tyce R. Basley maintain his dorzolamide-timolol, insulin NPH-regular Human, BD Insulin Syringe U/F, and ibuprofen.  No orders  of the defined types were placed in this encounter.    Follow-up: Return in about 3 months (around 09/24/2021).  Claretta Fraise, M.D.

## 2021-06-25 LAB — CBC WITH DIFFERENTIAL/PLATELET
Basophils Absolute: 0 10*3/uL (ref 0.0–0.2)
Basos: 1 %
EOS (ABSOLUTE): 0 10*3/uL (ref 0.0–0.4)
Eos: 0 %
Hematocrit: 32.3 % — ABNORMAL LOW (ref 37.5–51.0)
Hemoglobin: 11.2 g/dL — ABNORMAL LOW (ref 13.0–17.7)
Immature Grans (Abs): 0 10*3/uL (ref 0.0–0.1)
Immature Granulocytes: 0 %
Lymphocytes Absolute: 1.2 10*3/uL (ref 0.7–3.1)
Lymphs: 22 %
MCH: 33.7 pg — ABNORMAL HIGH (ref 26.6–33.0)
MCHC: 34.7 g/dL (ref 31.5–35.7)
MCV: 97 fL (ref 79–97)
Monocytes Absolute: 0.5 10*3/uL (ref 0.1–0.9)
Monocytes: 10 %
Neutrophils Absolute: 3.6 10*3/uL (ref 1.4–7.0)
Neutrophils: 67 %
Platelets: 253 10*3/uL (ref 150–450)
RBC: 3.32 x10E6/uL — ABNORMAL LOW (ref 4.14–5.80)
RDW: 12 % (ref 11.6–15.4)
WBC: 5.3 10*3/uL (ref 3.4–10.8)

## 2021-06-25 LAB — CMP14+EGFR
ALT: 10 IU/L (ref 0–44)
AST: 15 IU/L (ref 0–40)
Albumin/Globulin Ratio: 1 — ABNORMAL LOW (ref 1.2–2.2)
Albumin: 3.8 g/dL (ref 3.6–4.6)
Alkaline Phosphatase: 117 IU/L (ref 44–121)
BUN/Creatinine Ratio: 17 (ref 10–24)
BUN: 22 mg/dL (ref 8–27)
Bilirubin Total: 0.3 mg/dL (ref 0.0–1.2)
CO2: 23 mmol/L (ref 20–29)
Calcium: 9.3 mg/dL (ref 8.6–10.2)
Chloride: 103 mmol/L (ref 96–106)
Creatinine, Ser: 1.27 mg/dL (ref 0.76–1.27)
Globulin, Total: 3.7 g/dL (ref 1.5–4.5)
Glucose: 91 mg/dL (ref 70–99)
Potassium: 5.1 mmol/L (ref 3.5–5.2)
Sodium: 138 mmol/L (ref 134–144)
Total Protein: 7.5 g/dL (ref 6.0–8.5)
eGFR: 55 mL/min/{1.73_m2} — ABNORMAL LOW (ref >59)

## 2021-06-25 LAB — LIPID PANEL
Chol/HDL Ratio: 3.1 ratio (ref 0.0–5.0)
Cholesterol, Total: 137 mg/dL (ref 100–199)
HDL: 44 mg/dL (ref >39)
LDL Chol Calc (NIH): 77 mg/dL (ref 0–99)
Triglycerides: 85 mg/dL (ref 0–149)
VLDL Cholesterol Cal: 16 mg/dL (ref 5–40)

## 2021-06-27 NOTE — Progress Notes (Signed)
Hello Les,  Your lab result is normal and/or stable.Some minor variations that are not significant are commonly marked abnormal, but do not represent any medical problem for you.  Best regards, Reginae Wolfrey, M.D.

## 2021-07-15 DIAGNOSIS — X32XXXD Exposure to sunlight, subsequent encounter: Secondary | ICD-10-CM | POA: Diagnosis not present

## 2021-07-15 DIAGNOSIS — Z85828 Personal history of other malignant neoplasm of skin: Secondary | ICD-10-CM | POA: Diagnosis not present

## 2021-07-15 DIAGNOSIS — L820 Inflamed seborrheic keratosis: Secondary | ICD-10-CM | POA: Diagnosis not present

## 2021-07-15 DIAGNOSIS — L570 Actinic keratosis: Secondary | ICD-10-CM | POA: Diagnosis not present

## 2021-07-15 DIAGNOSIS — Z08 Encounter for follow-up examination after completed treatment for malignant neoplasm: Secondary | ICD-10-CM | POA: Diagnosis not present

## 2021-07-15 DIAGNOSIS — D045 Carcinoma in situ of skin of trunk: Secondary | ICD-10-CM | POA: Diagnosis not present

## 2021-07-22 ENCOUNTER — Ambulatory Visit (INDEPENDENT_AMBULATORY_CARE_PROVIDER_SITE_OTHER): Payer: Medicare Other | Admitting: Family Medicine

## 2021-07-22 ENCOUNTER — Encounter: Payer: Self-pay | Admitting: Family Medicine

## 2021-07-22 VITALS — BP 124/56 | HR 65 | Temp 97.5°F | Ht 71.0 in | Wt 203.6 lb

## 2021-07-22 DIAGNOSIS — L02811 Cutaneous abscess of head [any part, except face]: Secondary | ICD-10-CM | POA: Diagnosis not present

## 2021-07-22 MED ORDER — SULFAMETHOXAZOLE-TRIMETHOPRIM 800-160 MG PO TABS: 1.0000 | ORAL_TABLET | Freq: Two times a day (BID) | ORAL | 0 refills | Status: AC

## 2021-07-22 NOTE — Progress Notes (Signed)
Assessment & Plan:  1. Abscess of scalp Education provided on skin abscesses.  - sulfamethoxazole-trimethoprim (BACTRIM DS) 800-160 MG tablet; Take 1 tablet by mouth 2 (two) times daily for 7 days.  Dispense: 14 tablet; Refill: 0 - I & D   Follow up plan: Return if symptoms worsen or fail to improve.  Hendricks Limes, MSN, APRN, FNP-C [facility]  Subjective:   Patient ID: [name], male    DOB: 11-May-1932, 88 y.o.   MRN: [id number]  HPI: [name] is a 88 y.o. male presenting on 07/22/2021 for Head Injury (Patient states he thinks he hit his head x 1 week. )  Patient is accompanied by his son, who he is okay with being present.  Patient reports a spot on the top of his head that is tender and enlarging. It has been present x1 week. He believes initially he hit it on the hand grip of his walker as he tends to bend over too far when standing. Denies headaches.    ROS: Negative unless specifically indicated above in HPI.   Relevant past medical history reviewed and updated as indicated.   Allergies and medications reviewed and updated.   Current Outpatient Medications:    dorzolamide-timolol (COSOPT) 22.3-6.8 MG/ML ophthalmic solution, Place 1 drop into both eyes 2 (two) times daily., Disp: , Rfl:    ibuprofen (ADVIL) 600 MG tablet, Take 1 tablet (600 mg total) by mouth 3 (three) times daily as needed., Disp: 90 tablet, Rfl: 0   insulin NPH-regular Human (70-30) 100 UNIT/ML injection, Taking 15 units in the morning just before breakfast and 12 just before supper, Disp: 10 mL, Rfl: 5   Insulin Syringe-Needle U-100 (BD INSULIN SYRINGE U/F) 30G X 1/2" 0.5 ML MISC, 15 units before breakfast and 12 units before supper, Disp: 100 each, Rfl: 11  Allergies  Allergen Reactions   Morphine And Related Nausea And Vomiting    Objective:   BP (!) 124/56   Pulse 65   Temp (!) 97.5 F (36.4 C) (Temporal)   Ht '5\' 11"'$  (1.803 m)   Wt 203 lb 9.6 oz  (92.4 kg)   SpO2 97%   BMI 28.40 kg/m    Physical Exam Vitals reviewed.  Constitutional:      General: He is not in acute distress.    Appearance: Normal appearance. He is not ill-appearing, toxic-appearing or diaphoretic.  HENT:     Head: Normocephalic and atraumatic.  Eyes:     General: No scleral icterus.       Right eye: No discharge.        Left eye: No discharge.     Conjunctiva/sclera: Conjunctivae normal.  Cardiovascular:     Rate and Rhythm: Normal rate.  Pulmonary:     Effort: Pulmonary effort is normal. No respiratory distress.  Musculoskeletal:        General: Normal range of motion.     Cervical back: Normal range of motion.  Skin:    General: Skin is warm and dry.     Findings: Abscess (top of head with purulent drainage) present.  Neurological:     Mental Status: He is alert and oriented to person, place, and time. Mental status is at baseline.  Psychiatric:        Mood and Affect: Mood normal.        Behavior: Behavior normal.        Thought Content: Thought content normal.  Judgment: Judgment normal.   I & D  Date/Time: 07/22/2021 10:15 AM  Performed by: Loman Brooklyn, FNP Authorized by: Loman Brooklyn, FNP   Consent:    Consent obtained:  Written   Consent given by:  Patient   Risks, benefits, and alternatives were discussed: yes     Risks discussed:  Bleeding, incomplete drainage, pain and infection   Alternatives discussed:  Alternative treatment Location:    Type:  Abscess   Size:  1.5 cm   Location:  Head   Head location:  Scalp Pre-procedure details:    Skin preparation:  Alcohol and povidone-iodine Sedation:    Sedation type:  None Anesthesia:    Anesthesia method:  Topical application   Topical anesthesia: ethyl chloride. Procedure type:    Complexity:  Simple Procedure details:    Needle aspiration: no     Incision types:  Stab incision   Incision depth:  Dermal   Scalpel blade:  11   Wound management:  Probed and  deloculated   Drainage:  Purulent and bloody   Drainage amount:  Scant   Wound treatment:  Wound left open   Packing materials:  None Post-procedure details:    Procedure completion:  Tolerated well, no immediate complications

## 2021-08-05 ENCOUNTER — Other Ambulatory Visit: Payer: Self-pay | Admitting: Family Medicine

## 2021-09-02 DIAGNOSIS — Z08 Encounter for follow-up examination after completed treatment for malignant neoplasm: Secondary | ICD-10-CM | POA: Diagnosis not present

## 2021-09-02 DIAGNOSIS — Z85828 Personal history of other malignant neoplasm of skin: Secondary | ICD-10-CM | POA: Diagnosis not present

## 2021-09-23 ENCOUNTER — Ambulatory Visit (INDEPENDENT_AMBULATORY_CARE_PROVIDER_SITE_OTHER): Payer: Medicare Other | Admitting: Family Medicine

## 2021-09-23 ENCOUNTER — Encounter: Payer: Self-pay | Admitting: Family Medicine

## 2021-09-23 VITALS — BP 87/42 | HR 68 | Temp 97.3°F | Ht 71.0 in | Wt 207.6 lb

## 2021-09-23 DIAGNOSIS — S97121A Crushing injury of right lesser toe(s), initial encounter: Secondary | ICD-10-CM

## 2021-09-23 DIAGNOSIS — I959 Hypotension, unspecified: Secondary | ICD-10-CM | POA: Diagnosis not present

## 2021-09-23 DIAGNOSIS — L02611 Cutaneous abscess of right foot: Secondary | ICD-10-CM | POA: Diagnosis not present

## 2021-09-23 DIAGNOSIS — Z13220 Encounter for screening for lipoid disorders: Secondary | ICD-10-CM

## 2021-09-23 DIAGNOSIS — E119 Type 2 diabetes mellitus without complications: Secondary | ICD-10-CM

## 2021-09-23 DIAGNOSIS — I952 Hypotension due to drugs: Secondary | ICD-10-CM | POA: Diagnosis not present

## 2021-09-23 LAB — BAYER DCA HB A1C WAIVED: HB A1C (BAYER DCA - WAIVED): 6.5 % — ABNORMAL HIGH (ref 4.8–5.6)

## 2021-09-23 MED ORDER — INSULIN NPH ISOPHANE & REGULAR (70-30) 100 UNIT/ML ~~LOC~~ SUSP: SUBCUTANEOUS | 3 refills | Status: DC

## 2021-09-23 MED ORDER — AMOXICILLIN-POT CLAVULANATE 875-125 MG PO TABS: 1.0000 | ORAL_TABLET | Freq: Two times a day (BID) | ORAL | 0 refills | Status: DC

## 2021-09-23 MED ORDER — MIDODRINE HCL 2.5 MG PO TABS: 2.5000 mg | ORAL_TABLET | Freq: Three times a day (TID) | ORAL | 2 refills | Status: DC

## 2021-09-23 NOTE — Progress Notes (Addendum)
Subjective:  Patient ID: [name], male    DOB: 08-Nov-1932  Age: 88 y.o. MRN: [id number]  CC: Medical Management of Chronic Issues   HPI [name] presents forFollow-up of diabetes. Patient checks blood sugar at home.   80 fasting and 140 postprandial Patient denies symptoms such as polyuria, polydipsia, excessive hunger, nausea No significant hypoglycemic spells noted. Medications reviewed. Pt reports taking them regularly without complication/adverse reaction being reported today.   Dropped something on his foot last week. Now swollen and infected.   BP still low. Potentially contributing to loss of balance and falls.     History [name] has a past medical history of Arthritis, Diabetes mellitus without complication ([place]), and S/P knee replacement (left 2011, right 2012).   [name] has a past surgical history that includes Joint replacement; Back surgery (March 23, 2011); Total hip arthroplasty (11/02/2011); Total hip arthroplasty (Right, 02/24/2012); and Irrigation and debridement knee (Left, 07/29/2014).   His family history is not on file.[name] reports that [name] quit smoking about 42 years ago. His smoking use included cigarettes. [name] has a 20.00 pack-year smoking history. [name] quit smokeless tobacco use about 10 years ago. [name] reports that [name] does not currently use alcohol. [name] reports that [name] does not use drugs.  Current Outpatient Medications on File Prior to Visit  Medication Sig Dispense Refill   dorzolamide-timolol (COSOPT) 22.3-6.8 MG/ML ophthalmic solution Place 1 drop into both eyes 2 (two) times daily.     ibuprofen (ADVIL) 600 MG tablet Take 1 tablet by mouth three times daily as needed 90 tablet 1   Insulin Syringe-Needle U-100 (BD INSULIN SYRINGE U/F) 30G X 1/2" 0.5 ML MISC 15 units before breakfast and 12 units before supper 100 each 11   No current facility-administered medications on file prior to visit.    ROS Review of Systems  Constitutional:  Negative  for fever.  Respiratory:  Negative for shortness of breath.   Cardiovascular:  Negative for chest pain.  Musculoskeletal:  Negative for arthralgias.  Skin:  Negative for rash.  Neurological:  Positive for dizziness and weakness (legs unsteady, wek. Some falls and near falls. Has a walker. Doesn't always).    Objective:  BP (!) 87/42   Pulse 68   Temp (!) 97.3 F (36.3 C)   Ht _0  (1.803 m)   Wt 207 lb 9.6 oz (94.2 kg)   SpO2 96%   BMI 28.95 kg/m   BP Readings from Last 3 Encounters:  09/23/21 (!) 87/42  07/22/21 (!) 124/56  06/24/21 (!) 90/42    Wt Readings from Last 3 Encounters:  09/23/21 207 lb 9.6 oz (94.2 kg)  07/22/21 203 lb 9.6 oz (92.4 kg)  06/24/21 203 lb (92.1 kg)     Physical Exam Vitals reviewed.  Constitutional:      Appearance: [name] is well-developed.  HENT:     Head: Normocephalic and atraumatic.     Right Ear: External ear normal.     Left Ear: External ear normal.     Mouth/Throat:     Pharynx: No oropharyngeal exudate or posterior oropharyngeal erythema.  Eyes:     Pupils: Pupils are equal, round, and reactive to light.  Cardiovascular:     Rate and Rhythm: Normal rate and regular rhythm.     Heart sounds: No murmur heard. Pulmonary:     Effort: No respiratory distress.     Breath sounds: Normal breath sounds.  Musculoskeletal:     Cervical back: Normal range  of motion and neck supple.  Skin:    Findings: Lesion: macerated end of right 2nd toe with congealed blood. See image below.  Neurological:     Mental Status: [name] is alert and oriented to person, place, and time.        Assessment & Plan:   [name] was seen today for medical management of chronic issues.  Diagnoses and all orders for this visit:  Diabetes mellitus without complication ([place]) -     Bayer DCA Hb A1c Waived  Lipid screening -     Lipid panel  Hypotension, unspecified hypotension type -     CBC with Differential/Platelet -     CMP14+EGFR  Abscess of second  toe of right foot  Crushing injury of second toe of right foot, initial encounter  Other orders -     amoxicillin-clavulanate (AUGMENTIN) 875-125 MG tablet; Take 1 tablet by mouth 2 (two) times daily. Take all of this medication -     insulin NPH-regular Human (70-30) 100 UNIT/ML injection; Taking 15 units in the morning just before breakfast and 10 just before supper -     midodrine (PROAMATINE) 2.5 MG tablet; Take 1 tablet (2.5 mg total) by mouth 3 (three) times daily with meals.      I have changed [name]'s insulin NPH-regular Human. I am also having him start on amoxicillin-clavulanate and midodrine. Additionally, I am having him maintain his dorzolamide-timolol, BD Insulin Syringe U/F, and ibuprofen.  Meds ordered this encounter  Medications   amoxicillin-clavulanate (AUGMENTIN) 875-125 MG tablet    Sig: Take 1 tablet by mouth 2 (two) times daily. Take all of this medication    Dispense:  20 tablet    Refill:  0   insulin NPH-regular Human (70-30) 100 UNIT/ML injection    Sig: Taking 15 units in the morning just before breakfast and 10 just before supper    Dispense:  30 mL    Refill:  3   midodrine (PROAMATINE) 2.5 MG tablet    Sig: Take 1 tablet (2.5 mg total) by mouth 3 (three) times daily with meals.    Dispense:  90 tablet    Refill:  2   Phone call to his podiatrist Dr. Caprice Beaver with pt. To be seen tomorrow 9:30 AM for wound debridement.  Follow-up: Return in about 1 month (around 10/23/2021).  Claretta Fraise, M.D.

## 2021-09-24 DIAGNOSIS — M2041 Other hammer toe(s) (acquired), right foot: Secondary | ICD-10-CM | POA: Diagnosis not present

## 2021-09-24 DIAGNOSIS — E1140 Type 2 diabetes mellitus with diabetic neuropathy, unspecified: Secondary | ICD-10-CM | POA: Diagnosis not present

## 2021-09-24 DIAGNOSIS — M869 Osteomyelitis, unspecified: Secondary | ICD-10-CM | POA: Diagnosis not present

## 2021-09-24 DIAGNOSIS — L97514 Non-pressure chronic ulcer of other part of right foot with necrosis of bone: Secondary | ICD-10-CM | POA: Diagnosis not present

## 2021-09-24 LAB — LIPID PANEL
Chol/HDL Ratio: 2.9 ratio (ref 0.0–5.0)
Cholesterol, Total: 137 mg/dL (ref 100–199)
HDL: 47 mg/dL (ref >39)
LDL Chol Calc (NIH): 74 mg/dL (ref 0–99)
Triglycerides: 81 mg/dL (ref 0–149)
VLDL Cholesterol Cal: 16 mg/dL (ref 5–40)

## 2021-09-24 LAB — CBC WITH DIFFERENTIAL/PLATELET
Basophils Absolute: 0 10*3/uL (ref 0.0–0.2)
Basos: 1 %
EOS (ABSOLUTE): 0.1 10*3/uL (ref 0.0–0.4)
Eos: 2 %
Hematocrit: 33.7 % — ABNORMAL LOW (ref 37.5–51.0)
Hemoglobin: 11.6 g/dL — ABNORMAL LOW (ref 13.0–17.7)
Immature Grans (Abs): 0 10*3/uL (ref 0.0–0.1)
Immature Granulocytes: 0 %
Lymphocytes Absolute: 1.3 10*3/uL (ref 0.7–3.1)
Lymphs: 28 %
MCH: 34 pg — ABNORMAL HIGH (ref 26.6–33.0)
MCHC: 34.4 g/dL (ref 31.5–35.7)
MCV: 99 fL — ABNORMAL HIGH (ref 79–97)
Monocytes Absolute: 0.6 10*3/uL (ref 0.1–0.9)
Monocytes: 13 %
Neutrophils Absolute: 2.7 10*3/uL (ref 1.4–7.0)
Neutrophils: 56 %
Platelets: 266 10*3/uL (ref 150–450)
RBC: 3.41 x10E6/uL — ABNORMAL LOW (ref 4.14–5.80)
RDW: 12.2 % (ref 11.6–15.4)
WBC: 4.8 10*3/uL (ref 3.4–10.8)

## 2021-09-24 LAB — CMP14+EGFR
ALT: 10 IU/L (ref 0–44)
AST: 16 IU/L (ref 0–40)
Albumin/Globulin Ratio: 0.9 — ABNORMAL LOW (ref 1.2–2.2)
Albumin: 3.6 g/dL — ABNORMAL LOW (ref 3.7–4.7)
Alkaline Phosphatase: 102 IU/L (ref 44–121)
BUN/Creatinine Ratio: 16 (ref 10–24)
BUN: 22 mg/dL (ref 8–27)
Bilirubin Total: 0.3 mg/dL (ref 0.0–1.2)
CO2: 22 mmol/L (ref 20–29)
Calcium: 9.3 mg/dL (ref 8.6–10.2)
Chloride: 99 mmol/L (ref 96–106)
Creatinine, Ser: 1.41 mg/dL — ABNORMAL HIGH (ref 0.76–1.27)
Globulin, Total: 3.9 g/dL (ref 1.5–4.5)
Glucose: 114 mg/dL — ABNORMAL HIGH (ref 70–99)
Potassium: 5.6 mmol/L — ABNORMAL HIGH (ref 3.5–5.2)
Sodium: 135 mmol/L (ref 134–144)
Total Protein: 7.5 g/dL (ref 6.0–8.5)
eGFR: 48 mL/min/{1.73_m2} — ABNORMAL LOW (ref >59)

## 2021-09-27 NOTE — Progress Notes (Signed)
Hello Evelyn,  Your lab result is normal and/or stable.Some minor variations that are not significant are commonly marked abnormal, but do not represent any medical problem for you.  Best regards, Claretta Fraise, M.D.

## 2021-09-28 DIAGNOSIS — M545 Low back pain, unspecified: Secondary | ICD-10-CM | POA: Diagnosis not present

## 2021-09-28 DIAGNOSIS — E1142 Type 2 diabetes mellitus with diabetic polyneuropathy: Secondary | ICD-10-CM | POA: Diagnosis not present

## 2021-09-28 DIAGNOSIS — M869 Osteomyelitis, unspecified: Secondary | ICD-10-CM | POA: Diagnosis not present

## 2021-09-28 DIAGNOSIS — N390 Urinary tract infection, site not specified: Secondary | ICD-10-CM | POA: Diagnosis not present

## 2021-09-28 DIAGNOSIS — L97514 Non-pressure chronic ulcer of other part of right foot with necrosis of bone: Secondary | ICD-10-CM | POA: Diagnosis not present

## 2021-09-28 DIAGNOSIS — G894 Chronic pain syndrome: Secondary | ICD-10-CM | POA: Diagnosis not present

## 2021-09-30 DIAGNOSIS — J9811 Atelectasis: Secondary | ICD-10-CM | POA: Diagnosis not present

## 2021-09-30 DIAGNOSIS — Z01818 Encounter for other preprocedural examination: Secondary | ICD-10-CM | POA: Diagnosis not present

## 2021-09-30 DIAGNOSIS — M869 Osteomyelitis, unspecified: Secondary | ICD-10-CM | POA: Diagnosis not present

## 2021-10-01 DIAGNOSIS — I440 Atrioventricular block, first degree: Secondary | ICD-10-CM | POA: Diagnosis not present

## 2021-10-01 DIAGNOSIS — E1142 Type 2 diabetes mellitus with diabetic polyneuropathy: Secondary | ICD-10-CM | POA: Diagnosis not present

## 2021-10-01 DIAGNOSIS — M19071 Primary osteoarthritis, right ankle and foot: Secondary | ICD-10-CM | POA: Diagnosis not present

## 2021-10-01 DIAGNOSIS — E1140 Type 2 diabetes mellitus with diabetic neuropathy, unspecified: Secondary | ICD-10-CM | POA: Diagnosis not present

## 2021-10-01 DIAGNOSIS — I10 Essential (primary) hypertension: Secondary | ICD-10-CM | POA: Diagnosis not present

## 2021-10-01 DIAGNOSIS — L97514 Non-pressure chronic ulcer of other part of right foot with necrosis of bone: Secondary | ICD-10-CM | POA: Diagnosis not present

## 2021-10-01 DIAGNOSIS — E1169 Type 2 diabetes mellitus with other specified complication: Secondary | ICD-10-CM | POA: Diagnosis not present

## 2021-10-01 DIAGNOSIS — M86171 Other acute osteomyelitis, right ankle and foot: Secondary | ICD-10-CM | POA: Diagnosis not present

## 2021-10-01 DIAGNOSIS — M86172 Other acute osteomyelitis, left ankle and foot: Secondary | ICD-10-CM | POA: Diagnosis not present

## 2021-10-01 DIAGNOSIS — M2041 Other hammer toe(s) (acquired), right foot: Secondary | ICD-10-CM | POA: Diagnosis not present

## 2021-10-01 DIAGNOSIS — Z89421 Acquired absence of other right toe(s): Secondary | ICD-10-CM | POA: Diagnosis not present

## 2021-10-01 DIAGNOSIS — L97516 Non-pressure chronic ulcer of other part of right foot with bone involvement without evidence of necrosis: Secondary | ICD-10-CM | POA: Diagnosis not present

## 2021-10-01 DIAGNOSIS — E11621 Type 2 diabetes mellitus with foot ulcer: Secondary | ICD-10-CM | POA: Diagnosis not present

## 2021-10-01 DIAGNOSIS — M869 Osteomyelitis, unspecified: Secondary | ICD-10-CM | POA: Diagnosis not present

## 2021-10-15 DIAGNOSIS — R9431 Abnormal electrocardiogram [ECG] [EKG]: Secondary | ICD-10-CM | POA: Diagnosis not present

## 2021-10-28 ENCOUNTER — Ambulatory Visit (INDEPENDENT_AMBULATORY_CARE_PROVIDER_SITE_OTHER): Payer: Medicare Other | Admitting: Family Medicine

## 2021-10-28 ENCOUNTER — Encounter: Payer: Self-pay | Admitting: Family Medicine

## 2021-10-28 VITALS — BP 127/66 | HR 66 | Temp 98.8°F | Ht 71.0 in | Wt 208.0 lb

## 2021-10-28 DIAGNOSIS — L97511 Non-pressure chronic ulcer of other part of right foot limited to breakdown of skin: Secondary | ICD-10-CM | POA: Diagnosis not present

## 2021-10-28 DIAGNOSIS — E08621 Diabetes mellitus due to underlying condition with foot ulcer: Secondary | ICD-10-CM

## 2021-10-28 DIAGNOSIS — Z794 Long term (current) use of insulin: Secondary | ICD-10-CM

## 2021-10-28 DIAGNOSIS — I959 Hypotension, unspecified: Secondary | ICD-10-CM

## 2021-10-28 DIAGNOSIS — E119 Type 2 diabetes mellitus without complications: Secondary | ICD-10-CM | POA: Diagnosis not present

## 2021-10-28 MED ORDER — MIDODRINE HCL 2.5 MG PO TABS: 2.5000 mg | ORAL_TABLET | Freq: Three times a day (TID) | ORAL | 2 refills | Status: DC

## 2021-10-28 NOTE — Progress Notes (Signed)
Subjective:  Patient ID: [name], male    DOB: 01-07-1933  Age: 88 y.o. MRN: [id number]  CC: No chief complaint on file.   HPI [name] presents for hypotension follow up. BP much higher. No further dizziness since starting midodrine last month. Blood glucose responding well to current dose of insulin.   Toes healing well. Seeing wound center. Had distal toe amputation.     10/28/2021   11:11 AM 09/23/2021   10:49 AM 09/23/2021   10:31 AM  Depression screen PHQ 2/9  Decreased Interest 2 2 0  Down, Depressed, Hopeless 2 2 0  PHQ - 2 Score 4 4 0  Altered sleeping 0 1   Tired, decreased energy 2 3   Change in appetite 0 0   Feeling bad or failure about yourself  1 0   Trouble concentrating 0 0   Moving slowly or fidgety/restless 0 0   Suicidal thoughts 0 0   PHQ-9 Score 7 8   Difficult doing work/chores Somewhat difficult Somewhat difficult     History [name] has a past medical history of Arthritis, Diabetes mellitus without complication ([place]), and S/P knee replacement (left 2011, right 2012).   [name] has a past surgical history that includes Joint replacement; Back surgery (March 23, 2011); Total hip arthroplasty (11/02/2011); Total hip arthroplasty (Right, 02/24/2012); and Irrigation and debridement knee (Left, 07/29/2014).   [name] family history is not on file.[name] reports that [name] quit smoking about 42 years ago. [name] smoking use included cigarettes. [name] has a 20.00 pack-year smoking history. [name] quit smokeless tobacco use about 10 years ago. [name] reports that [name] does not currently use alcohol. [name] reports that [name] does not use drugs.    ROS Review of Systems  Constitutional:  Negative for fever.  Respiratory:  Negative for shortness of breath.   Cardiovascular:  Negative for chest pain.  Musculoskeletal:  Negative for arthralgias.  Skin:  Negative for rash.    Objective:  BP 127/66   Pulse 66   Temp 98.8 F (37.1 C)   Ht '5\' 11"'$  (1.803 m)   Wt 208 lb (94.3  kg)   SpO2 98%   BMI 29.01 kg/m   BP Readings from Last 3 Encounters:  10/28/21 127/66  09/23/21 (!) 87/42  07/22/21 (!) 124/56    Wt Readings from Last 3 Encounters:  10/28/21 208 lb (94.3 kg)  09/23/21 207 lb 9.6 oz (94.2 kg)  07/22/21 203 lb 9.6 oz (92.4 kg)     Physical Exam Vitals reviewed.  Constitutional:      Appearance: [name] is well-developed.  HENT:     Head: Normocephalic and atraumatic.     Right Ear: External ear normal.     Left Ear: External ear normal.     Mouth/Throat:     Pharynx: No oropharyngeal exudate or posterior oropharyngeal erythema.  Eyes:     Pupils: Pupils are equal, round, and reactive to light.  Cardiovascular:     Rate and Rhythm: Normal rate and regular rhythm.     Heart sounds: No murmur heard. Pulmonary:     Effort: No respiratory distress.     Breath sounds: Normal breath sounds.  Musculoskeletal:     Cervical back: Normal range of motion and neck supple.  Neurological:     Mental Status: [name] is alert and oriented to person, place, and time.       Assessment & Plan:   Diagnoses and all orders for this visit:  Hypotension, unspecified hypotension type  Diabetes mellitus type 2, insulin dependent ([facility])  Diabetic ulcer of toe of right foot associated with diabetes mellitus due to underlying condition, limited to breakdown of skin ([place])  Other orders -     midodrine (PROAMATINE) 2.5 MG tablet; Take 1 tablet (2.5 mg total) by mouth 3 (three) times daily with meals.       I am having [name] maintain [name] dorzolamide-timolol, BD Insulin Syringe U/F, ibuprofen, amoxicillin-clavulanate, insulin NPH-regular Human, and midodrine.  Allergies as of 10/28/2021       Reactions   Morphine And Related Nausea And Vomiting        Medication List        Accurate as of October 28, 2021 11:59 PM. If you have any questions, ask your nurse or doctor.          amoxicillin-clavulanate 875-125 MG tablet Commonly  known as: AUGMENTIN Take 1 tablet by mouth 2 (two) times daily. Take all of this medication   BD Insulin Syringe U/F 30G X 1/2" 0.5 ML Misc Generic drug: Insulin Syringe-Needle U-100 15 units before breakfast and 12 units before supper   dorzolamide-timolol 2-0.5 % ophthalmic solution Commonly known as: COSOPT Place 1 drop into both eyes 2 (two) times daily.   ibuprofen 600 MG tablet Commonly known as: ADVIL Take 1 tablet by mouth three times daily as needed   insulin NPH-regular Human (70-30) 100 UNIT/ML injection Taking 15 units in the morning just before breakfast and 10 just before supper   midodrine 2.5 MG tablet Commonly known as: PROAMATINE Take 1 tablet (2.5 mg total) by mouth 3 (three) times daily with meals.         Follow-up: No follow-ups on file.  Claretta Fraise, M.D.

## 2021-10-31 ENCOUNTER — Encounter: Payer: Self-pay | Admitting: Family Medicine

## 2021-11-02 DIAGNOSIS — Z4889 Encounter for other specified surgical aftercare: Secondary | ICD-10-CM | POA: Diagnosis not present

## 2021-11-23 DIAGNOSIS — Z4889 Encounter for other specified surgical aftercare: Secondary | ICD-10-CM | POA: Diagnosis not present

## 2021-11-24 DIAGNOSIS — H401132 Primary open-angle glaucoma, bilateral, moderate stage: Secondary | ICD-10-CM | POA: Diagnosis not present

## 2021-11-24 DIAGNOSIS — H0102A Squamous blepharitis right eye, upper and lower eyelids: Secondary | ICD-10-CM | POA: Diagnosis not present

## 2021-11-24 DIAGNOSIS — H04123 Dry eye syndrome of bilateral lacrimal glands: Secondary | ICD-10-CM | POA: Diagnosis not present

## 2021-11-24 DIAGNOSIS — H0102B Squamous blepharitis left eye, upper and lower eyelids: Secondary | ICD-10-CM | POA: Diagnosis not present

## 2021-11-24 DIAGNOSIS — E113553 Type 2 diabetes mellitus with stable proliferative diabetic retinopathy, bilateral: Secondary | ICD-10-CM | POA: Diagnosis not present

## 2021-12-14 ENCOUNTER — Encounter: Payer: Self-pay | Admitting: Family Medicine

## 2021-12-14 DIAGNOSIS — E11649 Type 2 diabetes mellitus with hypoglycemia without coma: Secondary | ICD-10-CM | POA: Insufficient documentation

## 2021-12-14 DIAGNOSIS — B351 Tinea unguium: Secondary | ICD-10-CM | POA: Diagnosis not present

## 2021-12-14 DIAGNOSIS — E1142 Type 2 diabetes mellitus with diabetic polyneuropathy: Secondary | ICD-10-CM | POA: Diagnosis not present

## 2021-12-14 DIAGNOSIS — S90412A Abrasion, left great toe, initial encounter: Secondary | ICD-10-CM | POA: Diagnosis not present

## 2021-12-23 ENCOUNTER — Telehealth: Payer: Self-pay | Admitting: Family Medicine

## 2021-12-23 NOTE — Telephone Encounter (Signed)
FAXED

## 2021-12-23 NOTE — Telephone Encounter (Signed)
Joi called from [employer] stating that they are working on [employer] order for pt but did not receive progress notes with the order. Need to fax them to [phone]

## 2022-02-01 ENCOUNTER — Ambulatory Visit (INDEPENDENT_AMBULATORY_CARE_PROVIDER_SITE_OTHER): Payer: Medicare Other | Admitting: Family Medicine

## 2022-02-01 ENCOUNTER — Encounter: Payer: Self-pay | Admitting: Family Medicine

## 2022-02-01 VITALS — BP 130/62 | HR 68 | Temp 97.7°F | Ht 71.0 in | Wt 208.0 lb

## 2022-02-01 DIAGNOSIS — I959 Hypotension, unspecified: Secondary | ICD-10-CM | POA: Diagnosis not present

## 2022-02-01 DIAGNOSIS — Z794 Long term (current) use of insulin: Secondary | ICD-10-CM

## 2022-02-01 DIAGNOSIS — E119 Type 2 diabetes mellitus without complications: Secondary | ICD-10-CM

## 2022-02-01 DIAGNOSIS — Z13220 Encounter for screening for lipoid disorders: Secondary | ICD-10-CM

## 2022-02-01 LAB — CMP14+EGFR
ALT: 7 IU/L (ref 0–44)
AST: 15 IU/L (ref 0–40)
Albumin/Globulin Ratio: 1.1 — ABNORMAL LOW (ref 1.2–2.2)
Albumin: 3.6 g/dL — ABNORMAL LOW (ref 3.7–4.7)
Alkaline Phosphatase: 80 IU/L (ref 44–121)
BUN/Creatinine Ratio: 16 (ref 10–24)
BUN: 24 mg/dL (ref 8–27)
Bilirubin Total: 0.3 mg/dL (ref 0.0–1.2)
CO2: 22 mmol/L (ref 20–29)
Calcium: 9.3 mg/dL (ref 8.6–10.2)
Chloride: 104 mmol/L (ref 96–106)
Creatinine, Ser: 1.51 mg/dL — ABNORMAL HIGH (ref 0.76–1.27)
Globulin, Total: 3.4 g/dL (ref 1.5–4.5)
Glucose: 138 mg/dL — ABNORMAL HIGH (ref 70–99)
Potassium: 4.8 mmol/L (ref 3.5–5.2)
Sodium: 138 mmol/L (ref 134–144)
Total Protein: 7 g/dL (ref 6.0–8.5)
eGFR: 44 mL/min/1.73 — ABNORMAL LOW (ref >59)

## 2022-02-01 LAB — CBC WITH DIFFERENTIAL/PLATELET
Basophils Absolute: 0.1 10*3/uL (ref 0.0–0.2)
Basos: 1 %
EOS (ABSOLUTE): 0.2 10*3/uL (ref 0.0–0.4)
Eos: 3 %
Hematocrit: 34.2 % — ABNORMAL LOW (ref 37.5–51.0)
Hemoglobin: 11.2 g/dL — ABNORMAL LOW (ref 13.0–17.7)
Immature Grans (Abs): 0 10*3/uL (ref 0.0–0.1)
Immature Granulocytes: 0 %
Lymphocytes Absolute: 1.3 10*3/uL (ref 0.7–3.1)
Lymphs: 21 %
MCH: 32.7 pg (ref 26.6–33.0)
MCHC: 32.7 g/dL (ref 31.5–35.7)
MCV: 100 fL — ABNORMAL HIGH (ref 79–97)
Monocytes Absolute: 0.6 10*3/uL (ref 0.1–0.9)
Monocytes: 10 %
Neutrophils Absolute: 3.9 10*3/uL (ref 1.4–7.0)
Neutrophils: 65 %
Platelets: 223 10*3/uL (ref 150–450)
RBC: 3.42 x10E6/uL — ABNORMAL LOW (ref 4.14–5.80)
RDW: 11.9 % (ref 11.6–15.4)
WBC: 6.1 10*3/uL (ref 3.4–10.8)

## 2022-02-01 LAB — BAYER DCA HB A1C WAIVED: HB A1C (BAYER DCA - WAIVED): 7 % — ABNORMAL HIGH (ref 4.8–5.6)

## 2022-02-01 NOTE — Progress Notes (Signed)
Subjective:  Patient ID: [name],  male    DOB: 03-Aug-1932  Age: 89 y.o.    CC: Medical Management of Chronic Issues   HPI GILLERMO POCH presents for  follow-up of hypertension. Patient has no history of headache chest pain or shortness of breath or recent cough. Patient also denies symptoms of TIA such as numbness weakness lateralizing. Patient denies side effects from medication. States taking it regularly.  Patient also  in for follow-up of elevated cholesterol. Doing well without complaints on current medication. Denies side effects  including myalgia and arthralgia and nausea. Also in today for liver function testing. Currently no chest pain, shortness of breath or other cardiovascular related symptoms noted.  Follow-up of diabetes. Patient does not check blood sugar at home.  Patient denies symptoms such as excessive hunger or urinary frequency, excessive hunger, nausea No significant hypoglycemic spells noted. Medications reviewed. Pt reports taking them regularly. Pt. denies complication/adverse reaction today.    History Rob has a past medical history of Arthritis, Diabetes mellitus without complication ([place]), and S/P knee replacement (left 2011, right 2012).   He has a past surgical history that includes Joint replacement; Back surgery (March 23, 2011); Total hip arthroplasty (11/02/2011); Total hip arthroplasty (Right, 02/24/2012); and Irrigation and debridement knee (Left, 07/29/2014).   His family history is not on file.He reports that he quit smoking about 43 years ago. His smoking use included cigarettes. He has a 20.00 pack-year smoking history. He quit smokeless tobacco use about 10 years ago. He reports that he does not currently use alcohol. He reports that he does not use drugs.  Current Outpatient Medications on File Prior to Visit  Medication Sig Dispense Refill   dorzolamide-timolol (COSOPT) 22.3-6.8 MG/ML ophthalmic solution Place 1 drop into  both eyes 2 (two) times daily.     ibuprofen (ADVIL) 600 MG tablet Take 1 tablet by mouth three times daily as needed 90 tablet 1   insulin NPH-regular Human (70-30) 100 UNIT/ML injection Taking 15 units in the morning just before breakfast and 10 just before supper 30 mL 3   Insulin Syringe-Needle U-100 (BD INSULIN SYRINGE U/F) 30G X 1/2" 0.5 ML MISC 15 units before breakfast and 12 units before supper 100 each 11   midodrine (PROAMATINE) 2.5 MG tablet Take 1 tablet (2.5 mg total) by mouth 3 (three) times daily with meals. 90 tablet 2   No current facility-administered medications on file prior to visit.    ROS Review of Systems  Constitutional:  Negative for fever.  Respiratory:  Negative for shortness of breath.   Cardiovascular:  Negative for chest pain.  Musculoskeletal:  Negative for arthralgias.  Skin:  Negative for rash.    Objective:  BP 130/62   Pulse 68   Temp 97.7 F (36.5 C)   Ht '5\' 11"'$  (1.803 m)   Wt 208 lb (94.3 kg)   SpO2 98%   BMI 29.01 kg/m   BP Readings from Last 3 Encounters:  02/01/22 130/62  10/28/21 127/66  09/23/21 (!) 87/42    Wt Readings from Last 3 Encounters:  02/01/22 208 lb (94.3 kg)  10/28/21 208 lb (94.3 kg)  09/23/21 207 lb 9.6 oz (94.2 kg)     Physical Exam Vitals reviewed.  Constitutional:      Appearance: He is well-developed.  HENT:     Head: Normocephalic and atraumatic.     Right Ear: External ear normal.     Left Ear: External ear normal.  Mouth/Throat:     Pharynx: No oropharyngeal exudate or posterior oropharyngeal erythema.  Eyes:     Pupils: Pupils are equal, round, and reactive to light.  Cardiovascular:     Rate and Rhythm: Normal rate and regular rhythm.     Heart sounds: No murmur heard. Pulmonary:     Effort: No respiratory distress.     Breath sounds: Normal breath sounds.  Musculoskeletal:     Cervical back: Normal range of motion and neck supple.  Neurological:     Mental Status: He is alert and  oriented to person, place, and time.     Diabetic Foot Exam - Simple   No data filed     Lab Results  Component Value Date   HGBA1C 7.0 (H) 02/01/2022   HGBA1C 6.5 (H) 09/23/2021   HGBA1C 6.4 (H) 06/24/2021    Assessment & Plan:   Azael was seen today for medical management of chronic issues.  Diagnoses and all orders for this visit:  Diabetes mellitus type 2, insulin dependent ([place]) -     Bayer DCA Hb A1c Waived  Hypotension, unspecified hypotension type -     CBC with Differential/Platelet -     CMP14+EGFR  Lipid screening -     Cancel: Lipid panel   I have discontinued Coulton R. Laham's amoxicillin-clavulanate. I am also having him maintain his dorzolamide-timolol, BD Insulin Syringe U/F, ibuprofen, insulin NPH-regular Human, and midodrine.  No orders of the defined types were placed in this encounter.    Follow-up: Return in about 3 months (around 05/03/2022).  Claretta Fraise, M.D.

## 2022-03-02 ENCOUNTER — Telehealth: Payer: Self-pay | Admitting: Family Medicine

## 2022-03-02 NOTE — Telephone Encounter (Signed)
Contacted Murtis Sink to schedule their annual wellness visit. Appointment made for 03/03/2022.  Thank you,  Colletta [place],  [place] [facility] Direct Dial [phone]

## 2022-03-03 ENCOUNTER — Ambulatory Visit: Payer: Medicare Other

## 2022-03-05 ENCOUNTER — Other Ambulatory Visit: Payer: Self-pay | Admitting: Family Medicine

## 2022-03-22 ENCOUNTER — Telehealth: Payer: Self-pay | Admitting: Family Medicine

## 2022-03-22 NOTE — Telephone Encounter (Signed)
Contacted [name] to schedule their annual wellness visit. Patient declined to schedule AWV at this time.  Per cancellation note 03/02/22 patient does not want to do visit  Thank you,  Colletta Maryland,  [facility] Direct Dial ??[phone]

## 2022-04-15 ENCOUNTER — Other Ambulatory Visit: Payer: Self-pay | Admitting: Family Medicine

## 2022-04-21 ENCOUNTER — Other Ambulatory Visit: Payer: Self-pay | Admitting: *Deleted

## 2022-04-21 ENCOUNTER — Telehealth: Payer: Self-pay | Admitting: Family Medicine

## 2022-04-21 DIAGNOSIS — E119 Type 2 diabetes mellitus without complications: Secondary | ICD-10-CM

## 2022-04-21 MED ORDER — GLUCOSE BLOOD VI STRP: ORAL_STRIP | 12 refills | Status: DC

## 2022-04-21 NOTE — Telephone Encounter (Signed)
  Prescription Request  04/21/2022  Is this a "Controlled Substance" medicine? no  Have you seen your PCP in the last 2 weeks? Pt has appt on 05/03/22  If YES, route message to pool  -  If NO, patient needs to be scheduled for appointment.  What is the name of the medication or equipment? Diabetic test strips for one touch ultra 50 count  Have you contacted your pharmacy to request a refill? yes   Which pharmacy would you like this sent to? [facility]    Patient notified that their request is being sent to the clinical staff for review and that they should receive a response within 2 business days.

## 2022-04-21 NOTE — Telephone Encounter (Signed)
Sent as requested.

## 2022-04-25 MED ORDER — GLUCOSE BLOOD VI STRP: ORAL_STRIP | 12 refills | Status: DC

## 2022-04-25 NOTE — Progress Notes (Signed)
Fax from [facility] needing specific directions & Dx code, resent

## 2022-04-25 NOTE — Addendum Note (Signed)
Addended by: Julious Payer D on: 04/25/2022 10:39 AM   Modules accepted: Orders

## 2022-05-03 ENCOUNTER — Ambulatory Visit (INDEPENDENT_AMBULATORY_CARE_PROVIDER_SITE_OTHER): Payer: Medicare Other | Admitting: Family Medicine

## 2022-05-03 ENCOUNTER — Encounter: Payer: Self-pay | Admitting: Family Medicine

## 2022-05-03 VITALS — BP 97/62 | HR 65 | Temp 97.0°F | Ht 71.0 in | Wt 209.0 lb

## 2022-05-03 DIAGNOSIS — Z794 Long term (current) use of insulin: Secondary | ICD-10-CM | POA: Diagnosis not present

## 2022-05-03 DIAGNOSIS — E119 Type 2 diabetes mellitus without complications: Secondary | ICD-10-CM | POA: Diagnosis not present

## 2022-05-03 DIAGNOSIS — D509 Iron deficiency anemia, unspecified: Secondary | ICD-10-CM | POA: Diagnosis not present

## 2022-05-03 DIAGNOSIS — I959 Hypotension, unspecified: Secondary | ICD-10-CM | POA: Diagnosis not present

## 2022-05-03 LAB — BAYER DCA HB A1C WAIVED: HB A1C (BAYER DCA - WAIVED): 6.6 % — ABNORMAL HIGH (ref 4.8–5.6)

## 2022-05-03 NOTE — Progress Notes (Signed)
Subjective:  Patient ID: [name], male    DOB: Apr 03, 1932  Age: 89 y.o. MRN: [id number]  CC: Medical Management of Chronic Issues   HPI [name] presents forFollow-up of diabetes. Patient checks blood sugar at home.   95 fasting and 130 postprandial Patient denies symptoms such as polyuria, polydipsia, excessive hunger, nausea No significant hypoglycemic spells noted. Medications reviewed. Pt reports taking them regularly without complication/adverse reaction being reported today.    presents for  follow-up of hypertension. Patient has no history of headache chest pain or shortness of breath or recent cough. Patient also denies symptoms of TIA such as focal numbness or weakness. Patient denies side effects from medication. States taking it regularly.Home readings running 135/ 55-65    History [name] has a past medical history of Arthritis, Diabetes mellitus without complication, and S/P knee replacement (left 2011, right 2012).   He has a past surgical history that includes Joint replacement; Back surgery (March 23, 2011); Total hip arthroplasty (11/02/2011); Total hip arthroplasty (Right, 02/24/2012); and Irrigation and debridement knee (Left, 07/29/2014).   His family history is not on file.He reports that he quit smoking about 43 years ago. His smoking use included cigarettes. He has a 20.00 pack-year smoking history. He quit smokeless tobacco use about 10 years ago. He reports that he does not currently use alcohol. He reports that he does not use drugs.  Current Outpatient Medications on File Prior to Visit  Medication Sig Dispense Refill   dorzolamide-timolol (COSOPT) 22.3-6.8 MG/ML ophthalmic solution Place 1 drop into both eyes 2 (two) times daily.     glucose blood test strip Test BS daily Dx E11.649 50 each 12   ibuprofen (ADVIL) 600 MG tablet Take 1 tablet by mouth three times daily as needed 90 tablet 0   insulin NPH-regular Human (70-30) 100 UNIT/ML  injection Taking 15 units in the morning just before breakfast and 10 just before supper 30 mL 3   Insulin Syringe-Needle U-100 (BD INSULIN SYRINGE U/F) 30G X 1/2" 0.5 ML MISC Use BID w/ insulin Dx E11.649 200 each 3   midodrine (PROAMATINE) 2.5 MG tablet Take 1 tablet (2.5 mg total) by mouth 3 (three) times daily with meals. 90 tablet 2   No current facility-administered medications on file prior to visit.    ROS Review of Systems  Constitutional:  Negative for fever.  Respiratory:  Negative for shortness of breath.   Cardiovascular:  Negative for chest pain.  Musculoskeletal:  Negative for arthralgias.  Skin:  Negative for rash.    Objective:  BP 97/62   Pulse 65   Temp (!) 97 F (36.1 C)   Ht  (1.803 m)   Wt 209 lb (94.8 kg)   SpO2 96%   BMI 29.15 kg/m   BP Readings from Last 3 Encounters:  05/03/22 97/62  02/01/22 130/62  10/28/21 127/66    Wt Readings from Last 3 Encounters:  05/03/22 209 lb (94.8 kg)  02/01/22 208 lb (94.3 kg)  10/28/21 208 lb (94.3 kg)     Physical Exam Vitals reviewed.  Constitutional:      Appearance: He is well-developed.  HENT:     Head: Normocephalic and atraumatic.     Right Ear: External ear normal.     Left Ear: External ear normal.     Mouth/Throat:     Pharynx: No oropharyngeal exudate or posterior oropharyngeal erythema.  Eyes:     Pupils: Pupils are equal, round, and reactive to  light.  Cardiovascular:     Rate and Rhythm: Normal rate and regular rhythm.     Heart sounds: No murmur heard. Pulmonary:     Effort: No respiratory distress.     Breath sounds: Normal breath sounds.  Musculoskeletal:     Cervical back: Normal range of motion and neck supple.  Neurological:     Mental Status: He is alert and oriented to person, place, and time.       Assessment & Plan:   Paxton was seen today for medical management of chronic issues.  Diagnoses and all orders for this visit:  Diabetes mellitus type 2, insulin  dependent -     Bayer DCA Hb A1c Waived  Hypotension, unspecified hypotension type -     CBC with Differential/Platelet -     CMP14+EGFR      I am having [name] maintain his dorzolamide-timolol, insulin NPH-regular Human, midodrine, ibuprofen, BD Insulin Syringe U/F, and glucose blood.  No orders of the defined types were placed in this encounter.    Follow-up: Return in about 3 months (around 08/02/2022).  [name], M.D.

## 2022-05-04 LAB — CBC WITH DIFFERENTIAL/PLATELET
Basophils Absolute: 0 10*3/uL (ref 0.0–0.2)
Basos: 1 %
EOS (ABSOLUTE): 0.6 10*3/uL — ABNORMAL HIGH (ref 0.0–0.4)
Eos: 10 %
Hematocrit: 36.4 % — ABNORMAL LOW (ref 37.5–51.0)
Hemoglobin: 12 g/dL — ABNORMAL LOW (ref 13.0–17.7)
Immature Grans (Abs): 0 10*3/uL (ref 0.0–0.1)
Immature Granulocytes: 0 %
Lymphocytes Absolute: 1.5 10*3/uL (ref 0.7–3.1)
Lymphs: 24 %
MCH: 33.3 pg — ABNORMAL HIGH (ref 26.6–33.0)
MCHC: 33 g/dL (ref 31.5–35.7)
MCV: 101 fL — ABNORMAL HIGH (ref 79–97)
Monocytes Absolute: 0.6 10*3/uL (ref 0.1–0.9)
Monocytes: 11 %
Neutrophils Absolute: 3.3 10*3/uL (ref 1.4–7.0)
Neutrophils: 54 %
Platelets: 241 10*3/uL (ref 150–450)
RBC: 3.6 x10E6/uL — ABNORMAL LOW (ref 4.14–5.80)
RDW: 12.2 % (ref 11.6–15.4)
WBC: 6.1 10*3/uL (ref 3.4–10.8)

## 2022-05-04 LAB — CMP14+EGFR
ALT: 11 IU/L (ref 0–44)
AST: 19 IU/L (ref 0–40)
Albumin/Globulin Ratio: 1.2 (ref 1.2–2.2)
Albumin: 3.8 g/dL (ref 3.7–4.7)
Alkaline Phosphatase: 78 IU/L (ref 44–121)
BUN/Creatinine Ratio: 15 (ref 10–24)
BUN: 23 mg/dL (ref 8–27)
Bilirubin Total: 0.3 mg/dL (ref 0.0–1.2)
CO2: 21 mmol/L (ref 20–29)
Calcium: 9.5 mg/dL (ref 8.6–10.2)
Chloride: 101 mmol/L (ref 96–106)
Creatinine, Ser: 1.52 mg/dL — ABNORMAL HIGH (ref 0.76–1.27)
Globulin, Total: 3.3 g/dL (ref 1.5–4.5)
Glucose: 150 mg/dL — ABNORMAL HIGH (ref 70–99)
Potassium: 4.8 mmol/L (ref 3.5–5.2)
Sodium: 135 mmol/L (ref 134–144)
Total Protein: 7.1 g/dL (ref 6.0–8.5)
eGFR: 44 mL/min/{1.73_m2} — ABNORMAL LOW (ref >59)

## 2022-05-05 LAB — SPECIMEN STATUS REPORT

## 2022-05-06 LAB — VITAMIN B12: Vitamin B-12: 222 pg/mL — ABNORMAL LOW (ref 232–1245)

## 2022-05-06 LAB — FOLATE: Folate: 10.5 ng/mL (ref >3.0)

## 2022-05-12 ENCOUNTER — Ambulatory Visit (INDEPENDENT_AMBULATORY_CARE_PROVIDER_SITE_OTHER): Payer: Medicare Other

## 2022-05-12 DIAGNOSIS — E538 Deficiency of other specified B group vitamins: Secondary | ICD-10-CM | POA: Diagnosis not present

## 2022-05-12 MED ORDER — CYANOCOBALAMIN 1000 MCG/ML IJ SOLN
1000.0000 ug | INTRAMUSCULAR | Status: DC
Start: 2022-05-12 — End: 2022-11-07
  Administered 2022-05-12 – 2022-08-22 (×3): 1000 ug via INTRAMUSCULAR

## 2022-05-12 NOTE — Progress Notes (Signed)
Cyanocobalamin injection given to left deltoid.  Patient tolerated well. 

## 2022-05-25 DIAGNOSIS — H401132 Primary open-angle glaucoma, bilateral, moderate stage: Secondary | ICD-10-CM | POA: Diagnosis not present

## 2022-05-26 DIAGNOSIS — B351 Tinea unguium: Secondary | ICD-10-CM | POA: Diagnosis not present

## 2022-05-26 DIAGNOSIS — E1142 Type 2 diabetes mellitus with diabetic polyneuropathy: Secondary | ICD-10-CM | POA: Diagnosis not present

## 2022-06-21 ENCOUNTER — Ambulatory Visit (INDEPENDENT_AMBULATORY_CARE_PROVIDER_SITE_OTHER): Payer: Medicare Other | Admitting: *Deleted

## 2022-06-21 ENCOUNTER — Other Ambulatory Visit: Payer: Self-pay | Admitting: Family Medicine

## 2022-06-21 DIAGNOSIS — E538 Deficiency of other specified B group vitamins: Secondary | ICD-10-CM | POA: Diagnosis not present

## 2022-06-21 NOTE — Progress Notes (Signed)
Pt came in for his B12 injection on the R-arm. Pt tol well, left w/walker with no c/o

## 2022-07-27 DIAGNOSIS — M7989 Other specified soft tissue disorders: Secondary | ICD-10-CM | POA: Diagnosis not present

## 2022-07-27 DIAGNOSIS — R600 Localized edema: Secondary | ICD-10-CM | POA: Diagnosis not present

## 2022-07-27 DIAGNOSIS — M169 Osteoarthritis of hip, unspecified: Secondary | ICD-10-CM | POA: Diagnosis not present

## 2022-07-27 DIAGNOSIS — H409 Unspecified glaucoma: Secondary | ICD-10-CM | POA: Diagnosis not present

## 2022-07-27 DIAGNOSIS — S0990XA Unspecified injury of head, initial encounter: Secondary | ICD-10-CM | POA: Diagnosis not present

## 2022-07-27 DIAGNOSIS — E1142 Type 2 diabetes mellitus with diabetic polyneuropathy: Secondary | ICD-10-CM | POA: Diagnosis not present

## 2022-07-27 DIAGNOSIS — R54 Age-related physical debility: Secondary | ICD-10-CM | POA: Diagnosis not present

## 2022-07-27 DIAGNOSIS — Z794 Long term (current) use of insulin: Secondary | ICD-10-CM | POA: Diagnosis not present

## 2022-07-27 DIAGNOSIS — E860 Dehydration: Secondary | ICD-10-CM | POA: Diagnosis not present

## 2022-07-27 DIAGNOSIS — E1165 Type 2 diabetes mellitus with hyperglycemia: Secondary | ICD-10-CM | POA: Diagnosis not present

## 2022-07-27 DIAGNOSIS — R0989 Other specified symptoms and signs involving the circulatory and respiratory systems: Secondary | ICD-10-CM | POA: Diagnosis not present

## 2022-07-27 DIAGNOSIS — I959 Hypotension, unspecified: Secondary | ICD-10-CM | POA: Diagnosis not present

## 2022-07-27 DIAGNOSIS — S299XXA Unspecified injury of thorax, initial encounter: Secondary | ICD-10-CM | POA: Diagnosis not present

## 2022-07-27 DIAGNOSIS — Z96653 Presence of artificial knee joint, bilateral: Secondary | ICD-10-CM | POA: Insufficient documentation

## 2022-07-27 DIAGNOSIS — Z96641 Presence of right artificial hip joint: Secondary | ICD-10-CM | POA: Diagnosis not present

## 2022-07-27 DIAGNOSIS — W19XXXD Unspecified fall, subsequent encounter: Secondary | ICD-10-CM | POA: Diagnosis not present

## 2022-07-27 DIAGNOSIS — R9431 Abnormal electrocardiogram [ECG] [EKG]: Secondary | ICD-10-CM | POA: Diagnosis not present

## 2022-07-27 DIAGNOSIS — N179 Acute kidney failure, unspecified: Secondary | ICD-10-CM | POA: Diagnosis not present

## 2022-07-27 DIAGNOSIS — I10 Essential (primary) hypertension: Secondary | ICD-10-CM | POA: Diagnosis not present

## 2022-07-27 DIAGNOSIS — I6782 Cerebral ischemia: Secondary | ICD-10-CM | POA: Diagnosis not present

## 2022-07-27 DIAGNOSIS — W19XXXA Unspecified fall, initial encounter: Secondary | ICD-10-CM | POA: Insufficient documentation

## 2022-07-27 DIAGNOSIS — R41841 Cognitive communication deficit: Secondary | ICD-10-CM | POA: Diagnosis not present

## 2022-07-27 DIAGNOSIS — E1140 Type 2 diabetes mellitus with diabetic neuropathy, unspecified: Secondary | ICD-10-CM | POA: Diagnosis not present

## 2022-07-27 DIAGNOSIS — G9389 Other specified disorders of brain: Secondary | ICD-10-CM | POA: Diagnosis not present

## 2022-07-27 DIAGNOSIS — M545 Low back pain, unspecified: Secondary | ICD-10-CM | POA: Diagnosis not present

## 2022-07-27 DIAGNOSIS — R410 Disorientation, unspecified: Secondary | ICD-10-CM | POA: Insufficient documentation

## 2022-07-27 DIAGNOSIS — F05 Delirium due to known physiological condition: Secondary | ICD-10-CM | POA: Diagnosis not present

## 2022-07-27 DIAGNOSIS — I440 Atrioventricular block, first degree: Secondary | ICD-10-CM | POA: Diagnosis not present

## 2022-07-27 DIAGNOSIS — Z96643 Presence of artificial hip joint, bilateral: Secondary | ICD-10-CM | POA: Diagnosis not present

## 2022-07-27 DIAGNOSIS — I340 Nonrheumatic mitral (valve) insufficiency: Secondary | ICD-10-CM | POA: Diagnosis not present

## 2022-07-27 DIAGNOSIS — J9811 Atelectasis: Secondary | ICD-10-CM | POA: Diagnosis not present

## 2022-07-27 DIAGNOSIS — R296 Repeated falls: Secondary | ICD-10-CM | POA: Diagnosis not present

## 2022-07-27 DIAGNOSIS — R262 Difficulty in walking, not elsewhere classified: Secondary | ICD-10-CM | POA: Diagnosis not present

## 2022-07-27 DIAGNOSIS — R5381 Other malaise: Secondary | ICD-10-CM | POA: Insufficient documentation

## 2022-07-27 DIAGNOSIS — Z791 Long term (current) use of non-steroidal anti-inflammatories (NSAID): Secondary | ICD-10-CM | POA: Diagnosis not present

## 2022-07-27 DIAGNOSIS — Z043 Encounter for examination and observation following other accident: Secondary | ICD-10-CM | POA: Diagnosis not present

## 2022-07-27 DIAGNOSIS — Z79899 Other long term (current) drug therapy: Secondary | ICD-10-CM | POA: Diagnosis not present

## 2022-07-27 DIAGNOSIS — R2243 Localized swelling, mass and lump, lower limb, bilateral: Secondary | ICD-10-CM | POA: Diagnosis not present

## 2022-07-27 DIAGNOSIS — R5383 Other fatigue: Secondary | ICD-10-CM | POA: Diagnosis not present

## 2022-07-27 DIAGNOSIS — I350 Nonrheumatic aortic (valve) stenosis: Secondary | ICD-10-CM | POA: Diagnosis not present

## 2022-07-27 DIAGNOSIS — Z66 Do not resuscitate: Secondary | ICD-10-CM | POA: Diagnosis not present

## 2022-07-27 DIAGNOSIS — E441 Mild protein-calorie malnutrition: Secondary | ICD-10-CM | POA: Diagnosis not present

## 2022-07-27 DIAGNOSIS — Z471 Aftercare following joint replacement surgery: Secondary | ICD-10-CM | POA: Diagnosis not present

## 2022-07-27 DIAGNOSIS — R4182 Altered mental status, unspecified: Secondary | ICD-10-CM | POA: Diagnosis not present

## 2022-07-27 DIAGNOSIS — Z885 Allergy status to narcotic agent status: Secondary | ICD-10-CM | POA: Diagnosis not present

## 2022-07-27 DIAGNOSIS — E119 Type 2 diabetes mellitus without complications: Secondary | ICD-10-CM | POA: Diagnosis not present

## 2022-07-27 DIAGNOSIS — M47816 Spondylosis without myelopathy or radiculopathy, lumbar region: Secondary | ICD-10-CM | POA: Diagnosis not present

## 2022-07-27 DIAGNOSIS — R4189 Other symptoms and signs involving cognitive functions and awareness: Secondary | ICD-10-CM | POA: Diagnosis not present

## 2022-07-27 DIAGNOSIS — R451 Restlessness and agitation: Secondary | ICD-10-CM | POA: Diagnosis not present

## 2022-07-27 DIAGNOSIS — R220 Localized swelling, mass and lump, head: Secondary | ICD-10-CM | POA: Diagnosis not present

## 2022-07-27 DIAGNOSIS — M4802 Spinal stenosis, cervical region: Secondary | ICD-10-CM | POA: Diagnosis not present

## 2022-07-27 DIAGNOSIS — E871 Hypo-osmolality and hyponatremia: Secondary | ICD-10-CM | POA: Diagnosis not present

## 2022-07-27 DIAGNOSIS — R4689 Other symptoms and signs involving appearance and behavior: Secondary | ICD-10-CM | POA: Diagnosis not present

## 2022-07-27 DIAGNOSIS — M6281 Muscle weakness (generalized): Secondary | ICD-10-CM | POA: Diagnosis not present

## 2022-07-27 DIAGNOSIS — I083 Combined rheumatic disorders of mitral, aortic and tricuspid valves: Secondary | ICD-10-CM | POA: Diagnosis not present

## 2022-07-27 DIAGNOSIS — M1990 Unspecified osteoarthritis, unspecified site: Secondary | ICD-10-CM | POA: Diagnosis not present

## 2022-08-02 ENCOUNTER — Ambulatory Visit: Payer: Medicare Other | Admitting: Family Medicine

## 2022-08-04 DIAGNOSIS — L03031 Cellulitis of right toe: Secondary | ICD-10-CM | POA: Diagnosis not present

## 2022-08-04 DIAGNOSIS — Z1331 Encounter for screening for depression: Secondary | ICD-10-CM | POA: Diagnosis not present

## 2022-08-04 DIAGNOSIS — W19XXXD Unspecified fall, subsequent encounter: Secondary | ICD-10-CM | POA: Diagnosis present

## 2022-08-04 DIAGNOSIS — M4802 Spinal stenosis, cervical region: Secondary | ICD-10-CM | POA: Diagnosis not present

## 2022-08-04 DIAGNOSIS — E559 Vitamin D deficiency, unspecified: Secondary | ICD-10-CM | POA: Diagnosis not present

## 2022-08-04 DIAGNOSIS — R410 Disorientation, unspecified: Secondary | ICD-10-CM | POA: Diagnosis not present

## 2022-08-04 DIAGNOSIS — E1142 Type 2 diabetes mellitus with diabetic polyneuropathy: Secondary | ICD-10-CM | POA: Diagnosis not present

## 2022-08-04 DIAGNOSIS — L02419 Cutaneous abscess of limb, unspecified: Secondary | ICD-10-CM | POA: Diagnosis not present

## 2022-08-04 DIAGNOSIS — Z96643 Presence of artificial hip joint, bilateral: Secondary | ICD-10-CM | POA: Diagnosis present

## 2022-08-04 DIAGNOSIS — R262 Difficulty in walking, not elsewhere classified: Secondary | ICD-10-CM | POA: Diagnosis not present

## 2022-08-04 DIAGNOSIS — D649 Anemia, unspecified: Secondary | ICD-10-CM | POA: Diagnosis not present

## 2022-08-04 DIAGNOSIS — R2243 Localized swelling, mass and lump, lower limb, bilateral: Secondary | ICD-10-CM | POA: Diagnosis not present

## 2022-08-04 DIAGNOSIS — M7989 Other specified soft tissue disorders: Secondary | ICD-10-CM | POA: Diagnosis not present

## 2022-08-04 DIAGNOSIS — R5381 Other malaise: Secondary | ICD-10-CM | POA: Diagnosis not present

## 2022-08-04 DIAGNOSIS — Z789 Other specified health status: Secondary | ICD-10-CM | POA: Diagnosis not present

## 2022-08-04 DIAGNOSIS — E119 Type 2 diabetes mellitus without complications: Secondary | ICD-10-CM | POA: Diagnosis not present

## 2022-08-04 DIAGNOSIS — M6281 Muscle weakness (generalized): Secondary | ICD-10-CM | POA: Diagnosis not present

## 2022-08-04 DIAGNOSIS — S91109A Unspecified open wound of unspecified toe(s) without damage to nail, initial encounter: Secondary | ICD-10-CM | POA: Diagnosis not present

## 2022-08-04 DIAGNOSIS — I959 Hypotension, unspecified: Secondary | ICD-10-CM | POA: Diagnosis not present

## 2022-08-04 DIAGNOSIS — H409 Unspecified glaucoma: Secondary | ICD-10-CM | POA: Diagnosis present

## 2022-08-04 DIAGNOSIS — N179 Acute kidney failure, unspecified: Secondary | ICD-10-CM | POA: Diagnosis not present

## 2022-08-04 DIAGNOSIS — E1140 Type 2 diabetes mellitus with diabetic neuropathy, unspecified: Secondary | ICD-10-CM | POA: Diagnosis present

## 2022-08-04 DIAGNOSIS — R54 Age-related physical debility: Secondary | ICD-10-CM | POA: Diagnosis not present

## 2022-08-04 DIAGNOSIS — E441 Mild protein-calorie malnutrition: Secondary | ICD-10-CM | POA: Diagnosis not present

## 2022-08-04 DIAGNOSIS — R41841 Cognitive communication deficit: Secondary | ICD-10-CM | POA: Diagnosis not present

## 2022-08-04 DIAGNOSIS — B351 Tinea unguium: Secondary | ICD-10-CM | POA: Diagnosis not present

## 2022-08-04 DIAGNOSIS — M169 Osteoarthritis of hip, unspecified: Secondary | ICD-10-CM | POA: Diagnosis present

## 2022-08-04 DIAGNOSIS — Z794 Long term (current) use of insulin: Secondary | ICD-10-CM | POA: Diagnosis not present

## 2022-08-04 DIAGNOSIS — Z96653 Presence of artificial knee joint, bilateral: Secondary | ICD-10-CM | POA: Diagnosis present

## 2022-08-05 LAB — LAB REPORT - SCANNED: A1c: 6.8

## 2022-08-07 DIAGNOSIS — Z96643 Presence of artificial hip joint, bilateral: Secondary | ICD-10-CM | POA: Diagnosis not present

## 2022-08-07 DIAGNOSIS — E1140 Type 2 diabetes mellitus with diabetic neuropathy, unspecified: Secondary | ICD-10-CM | POA: Diagnosis not present

## 2022-08-07 DIAGNOSIS — W19XXXD Unspecified fall, subsequent encounter: Secondary | ICD-10-CM | POA: Diagnosis not present

## 2022-08-07 DIAGNOSIS — H409 Unspecified glaucoma: Secondary | ICD-10-CM | POA: Diagnosis not present

## 2022-08-07 DIAGNOSIS — M169 Osteoarthritis of hip, unspecified: Secondary | ICD-10-CM | POA: Diagnosis not present

## 2022-08-07 DIAGNOSIS — L02419 Cutaneous abscess of limb, unspecified: Secondary | ICD-10-CM | POA: Diagnosis not present

## 2022-08-07 DIAGNOSIS — Z794 Long term (current) use of insulin: Secondary | ICD-10-CM | POA: Diagnosis not present

## 2022-08-07 DIAGNOSIS — Z96653 Presence of artificial knee joint, bilateral: Secondary | ICD-10-CM | POA: Diagnosis not present

## 2022-08-08 DIAGNOSIS — Z789 Other specified health status: Secondary | ICD-10-CM | POA: Diagnosis not present

## 2022-08-08 DIAGNOSIS — R5381 Other malaise: Secondary | ICD-10-CM | POA: Diagnosis not present

## 2022-08-09 DIAGNOSIS — M169 Osteoarthritis of hip, unspecified: Secondary | ICD-10-CM | POA: Diagnosis not present

## 2022-08-09 DIAGNOSIS — D649 Anemia, unspecified: Secondary | ICD-10-CM | POA: Diagnosis not present

## 2022-08-09 DIAGNOSIS — E559 Vitamin D deficiency, unspecified: Secondary | ICD-10-CM | POA: Diagnosis not present

## 2022-08-09 DIAGNOSIS — Z96653 Presence of artificial knee joint, bilateral: Secondary | ICD-10-CM | POA: Diagnosis not present

## 2022-08-09 DIAGNOSIS — Z96643 Presence of artificial hip joint, bilateral: Secondary | ICD-10-CM | POA: Diagnosis not present

## 2022-08-09 DIAGNOSIS — R5381 Other malaise: Secondary | ICD-10-CM | POA: Diagnosis not present

## 2022-08-16 DIAGNOSIS — S91109A Unspecified open wound of unspecified toe(s) without damage to nail, initial encounter: Secondary | ICD-10-CM | POA: Diagnosis not present

## 2022-08-16 DIAGNOSIS — E1142 Type 2 diabetes mellitus with diabetic polyneuropathy: Secondary | ICD-10-CM | POA: Diagnosis not present

## 2022-08-16 DIAGNOSIS — B351 Tinea unguium: Secondary | ICD-10-CM | POA: Diagnosis not present

## 2022-08-16 DIAGNOSIS — L03031 Cellulitis of right toe: Secondary | ICD-10-CM | POA: Diagnosis not present

## 2022-08-17 DIAGNOSIS — D649 Anemia, unspecified: Secondary | ICD-10-CM | POA: Diagnosis not present

## 2022-08-17 DIAGNOSIS — Z96653 Presence of artificial knee joint, bilateral: Secondary | ICD-10-CM | POA: Diagnosis not present

## 2022-08-17 DIAGNOSIS — R5381 Other malaise: Secondary | ICD-10-CM | POA: Diagnosis not present

## 2022-08-17 DIAGNOSIS — E871 Hypo-osmolality and hyponatremia: Secondary | ICD-10-CM | POA: Diagnosis not present

## 2022-08-17 DIAGNOSIS — Z794 Long term (current) use of insulin: Secondary | ICD-10-CM | POA: Diagnosis not present

## 2022-08-17 DIAGNOSIS — N179 Acute kidney failure, unspecified: Secondary | ICD-10-CM | POA: Diagnosis not present

## 2022-08-17 DIAGNOSIS — Z556 Problems related to health literacy: Secondary | ICD-10-CM | POA: Diagnosis not present

## 2022-08-17 DIAGNOSIS — M1990 Unspecified osteoarthritis, unspecified site: Secondary | ICD-10-CM | POA: Diagnosis not present

## 2022-08-17 DIAGNOSIS — Z96643 Presence of artificial hip joint, bilateral: Secondary | ICD-10-CM | POA: Diagnosis not present

## 2022-08-17 DIAGNOSIS — E559 Vitamin D deficiency, unspecified: Secondary | ICD-10-CM | POA: Diagnosis not present

## 2022-08-17 DIAGNOSIS — E1140 Type 2 diabetes mellitus with diabetic neuropathy, unspecified: Secondary | ICD-10-CM | POA: Diagnosis not present

## 2022-08-17 DIAGNOSIS — L02419 Cutaneous abscess of limb, unspecified: Secondary | ICD-10-CM | POA: Diagnosis not present

## 2022-08-17 DIAGNOSIS — Z9181 History of falling: Secondary | ICD-10-CM | POA: Diagnosis not present

## 2022-08-17 DIAGNOSIS — M169 Osteoarthritis of hip, unspecified: Secondary | ICD-10-CM | POA: Diagnosis not present

## 2022-08-17 DIAGNOSIS — M4802 Spinal stenosis, cervical region: Secondary | ICD-10-CM | POA: Diagnosis not present

## 2022-08-17 DIAGNOSIS — I959 Hypotension, unspecified: Secondary | ICD-10-CM | POA: Diagnosis not present

## 2022-08-18 DIAGNOSIS — M4802 Spinal stenosis, cervical region: Secondary | ICD-10-CM | POA: Diagnosis not present

## 2022-08-18 DIAGNOSIS — N179 Acute kidney failure, unspecified: Secondary | ICD-10-CM | POA: Diagnosis not present

## 2022-08-18 DIAGNOSIS — E1140 Type 2 diabetes mellitus with diabetic neuropathy, unspecified: Secondary | ICD-10-CM | POA: Diagnosis not present

## 2022-08-18 DIAGNOSIS — M1990 Unspecified osteoarthritis, unspecified site: Secondary | ICD-10-CM | POA: Diagnosis not present

## 2022-08-18 DIAGNOSIS — E871 Hypo-osmolality and hyponatremia: Secondary | ICD-10-CM | POA: Diagnosis not present

## 2022-08-18 DIAGNOSIS — Z794 Long term (current) use of insulin: Secondary | ICD-10-CM | POA: Diagnosis not present

## 2022-08-22 ENCOUNTER — Ambulatory Visit (INDEPENDENT_AMBULATORY_CARE_PROVIDER_SITE_OTHER): Payer: Medicare Other | Admitting: Family Medicine

## 2022-08-22 ENCOUNTER — Encounter: Payer: Self-pay | Admitting: Family Medicine

## 2022-08-22 VITALS — BP 133/65 | HR 65 | Temp 97.7°F | Ht 71.0 in | Wt 205.6 lb

## 2022-08-22 DIAGNOSIS — Z794 Long term (current) use of insulin: Secondary | ICD-10-CM | POA: Diagnosis not present

## 2022-08-22 DIAGNOSIS — M4802 Spinal stenosis, cervical region: Secondary | ICD-10-CM | POA: Diagnosis not present

## 2022-08-22 DIAGNOSIS — E538 Deficiency of other specified B group vitamins: Secondary | ICD-10-CM

## 2022-08-22 DIAGNOSIS — R799 Abnormal finding of blood chemistry, unspecified: Secondary | ICD-10-CM | POA: Diagnosis not present

## 2022-08-22 DIAGNOSIS — E1140 Type 2 diabetes mellitus with diabetic neuropathy, unspecified: Secondary | ICD-10-CM | POA: Diagnosis not present

## 2022-08-22 DIAGNOSIS — E871 Hypo-osmolality and hyponatremia: Secondary | ICD-10-CM | POA: Diagnosis not present

## 2022-08-22 DIAGNOSIS — M159 Polyosteoarthritis, unspecified: Secondary | ICD-10-CM | POA: Diagnosis not present

## 2022-08-22 DIAGNOSIS — E119 Type 2 diabetes mellitus without complications: Secondary | ICD-10-CM

## 2022-08-22 DIAGNOSIS — N179 Acute kidney failure, unspecified: Secondary | ICD-10-CM | POA: Diagnosis not present

## 2022-08-22 DIAGNOSIS — M1990 Unspecified osteoarthritis, unspecified site: Secondary | ICD-10-CM | POA: Diagnosis not present

## 2022-08-22 MED ORDER — CYANOCOBALAMIN 1000 MCG/ML IJ SOLN: 1000.0000 ug | INTRAMUSCULAR | 3 refills | Status: DC

## 2022-08-22 MED ORDER — "BD SAFETYGLIDE INSULIN SYRINGE 31G X 15/64"" 1 ML MISC": 99 refills | Status: DC

## 2022-08-22 NOTE — Progress Notes (Unsigned)
Subjective:  Patient ID: [name], male    DOB: 1932/12/26  Age: 89 y.o. MRN: [id number]  CC: SNF DISCHARGE FOLLOW UP   HPI [name] presents for Walking a little better.  Hospitalized with ARF and dehydration. This likely caused the fall. Went to rehab.  Transferred from Rehab to ALF. A little nervous about walking. [facility]. Has PT available there. No hip pain current. Has a walker for use at home.  Wearing extended heart monitor currently     08/22/2022    3:08 PM 08/22/2022    2:56 PM 05/03/2022   10:40 AM  Depression screen PHQ 2/9  Decreased Interest 2 0 2  Down, Depressed, Hopeless 2 0 1  PHQ - 2 Score 4 0 3  Altered sleeping 1  1  Tired, decreased energy 1  2  Change in appetite 0  0  Feeling bad or failure about yourself  0  1  Trouble concentrating 0  0  Moving slowly or fidgety/restless 0  0  Suicidal thoughts 0  0  PHQ-9 Score 6  7  Difficult doing work/chores Somewhat difficult  Somewhat difficult    History Rihaan has a past medical history of Arthritis, Diabetes mellitus without complication (HCC), and S/P knee replacement (left 2011, right 2012).   He has a past surgical history that includes Joint replacement; Back surgery (March 23, 2011); Total hip arthroplasty (11/02/2011); Total hip arthroplasty (Right, 02/24/2012); and Irrigation and debridement knee (Left, 07/29/2014).   His family history is not on file.He reports that he quit smoking about 43 years ago. His smoking use included cigarettes. He started smoking about 63 years ago. He has a 20 pack-year smoking history. He quit smokeless tobacco use about 11 years ago. He reports that he does not currently use alcohol. He reports that he does not use drugs.    ROS Review of Systems  Constitutional:  Negative for fever.  Respiratory:  Negative for shortness of breath.   Cardiovascular:  Negative for chest pain.  Musculoskeletal:  Negative for  arthralgias.  Skin:  Negative for rash.    Objective:  BP 133/65   Pulse 65   Temp 97.7 F (36.5 C)   Ht 5\' 11"  (1.803 m)   Wt 205 lb 9.6 oz (93.3 kg)   SpO2 98%   BMI 28.68 kg/m   BP Readings from Last 3 Encounters:  08/22/22 133/65  05/03/22 97/62  02/01/22 130/62    Wt Readings from Last 3 Encounters:  08/22/22 205 lb 9.6 oz (93.3 kg)  05/03/22 209 lb (94.8 kg)  02/01/22 208 lb (94.3 kg)     Physical Exam Constitutional:      General: He is not in acute distress.    Appearance: He is well-developed.  HENT:     Head: Normocephalic and atraumatic.     Right Ear: External ear normal.     Left Ear: External ear normal.     Nose: Nose normal.  Eyes:     Conjunctiva/sclera: Conjunctivae normal.     Pupils: Pupils are equal, round, and reactive to light.  Cardiovascular:     Rate and Rhythm: Normal rate and regular rhythm.     Heart sounds: Normal heart sounds. No murmur heard. Pulmonary:     Effort: Pulmonary effort is normal. No respiratory distress.     Breath sounds: Normal breath sounds. No wheezing or rales.  Abdominal:     Palpations:  Abdomen is soft.     Tenderness: There is no abdominal tenderness.  Musculoskeletal:        General: Normal range of motion.     Cervical back: Normal range of motion and neck supple.  Skin:    General: Skin is warm and dry.  Neurological:     Mental Status: He is alert and oriented to person, place, and time.     Deep Tendon Reflexes: Reflexes are normal and symmetric.  Psychiatric:        Behavior: Behavior normal.        Thought Content: Thought content normal.        Judgment: Judgment normal.       Assessment & Plan:   Aseem was seen today for snf discharge follow up.  Diagnoses and all orders for this visit:  B12 deficiency -     CBC with Differential/Platelet -     CMP14+EGFR  Diabetes mellitus type 2, insulin dependent (HCC) -     CBC with Differential/Platelet -     CMP14+EGFR  Primary  osteoarthritis involving multiple joints -     CBC with Differential/Platelet -     CMP14+EGFR  Other orders -     Discontinue: cyanocobalamin (VITAMIN B12) 1000 MCG/ML injection; Inject 1 mL (1,000 mcg total) into the skin every 30 (thirty) days. -     cyanocobalamin (VITAMIN B12) 1000 MCG/ML injection; Inject 1 mL (1,000 mcg total) into the skin every 30 (thirty) days. -     Insulin Syringe-Needle U-100 (BD SAFETYGLIDE INSULIN SYRINGE) 31G X 15/64" 1 ML MISC; Use to inject insulin as directed BID       I have discontinued Robel R. Youngren's BD Insulin Syringe U/F. I am also having him start on BD SafetyGlide Insulin Syringe. Additionally, I am having him maintain his dorzolamide-timolol, insulin NPH-regular Human, midodrine, glucose blood, ibuprofen, and cyanocobalamin. We administered cyanocobalamin. We will continue to administer cyanocobalamin.  Allergies as of 08/22/2022       Reactions   Morphine And Codeine Nausea And Vomiting        Medication List        Accurate as of August 22, 2022 11:59 PM. If you have any questions, ask your nurse or doctor.          STOP taking these medications    BD Insulin Syringe U/F 30G X 1/2" 0.5 ML Misc Generic drug: Insulin Syringe-Needle U-100 Replaced by: BD SafetyGlide Insulin Syringe 31G X 15/64" 1 ML Misc Stopped by: Broadus John        TAKE these medications    BD SafetyGlide Insulin Syringe 31G X 15/64" 1 ML Misc Generic drug: Insulin Syringe-Needle U-100 Use to inject insulin as directed BID Replaces: BD Insulin Syringe U/F 30G X 1/2" 0.5 ML Misc Started by:     cyanocobalamin 1000 MCG/ML injection Commonly known as: VITAMIN B12 Inject 1 mL (1,000 mcg total) into the skin every 30 (thirty) days. Started by:     dorzolamide-timolol 2-0.5 % ophthalmic solution Commonly known as: COSOPT Place 1 drop into both eyes 2 (two) times daily.   glucose blood test strip Test BS daily Dx  E11.649   ibuprofen 600 MG tablet Commonly known as: ADVIL Take 1 tablet by mouth three times daily as needed   insulin NPH-regular Human (70-30) 100 UNIT/ML injection Taking 15 units in the morning just before breakfast and 10 just before supper What changed: additional instructions   midodrine 2.5 MG tablet Commonly known as:  PROAMATINE Take 1 tablet (2.5 mg total) by mouth 3 (three) times daily with meals.         Follow-up: Return in about 3 months (around 11/22/2022), or if symptoms worsen or fail to improve.  [name], M.D.

## 2022-08-23 ENCOUNTER — Encounter: Payer: Self-pay | Admitting: Family Medicine

## 2022-08-23 ENCOUNTER — Telehealth: Payer: Self-pay | Admitting: Family Medicine

## 2022-08-23 DIAGNOSIS — M1990 Unspecified osteoarthritis, unspecified site: Secondary | ICD-10-CM | POA: Diagnosis not present

## 2022-08-23 DIAGNOSIS — M4802 Spinal stenosis, cervical region: Secondary | ICD-10-CM | POA: Diagnosis not present

## 2022-08-23 DIAGNOSIS — E1140 Type 2 diabetes mellitus with diabetic neuropathy, unspecified: Secondary | ICD-10-CM | POA: Diagnosis not present

## 2022-08-23 DIAGNOSIS — E871 Hypo-osmolality and hyponatremia: Secondary | ICD-10-CM | POA: Diagnosis not present

## 2022-08-23 DIAGNOSIS — N179 Acute kidney failure, unspecified: Secondary | ICD-10-CM | POA: Diagnosis not present

## 2022-08-23 DIAGNOSIS — Z794 Long term (current) use of insulin: Secondary | ICD-10-CM | POA: Diagnosis not present

## 2022-08-23 NOTE — Telephone Encounter (Signed)
Mr. [name]'s son wanted to be sure he got the smallest syringe possible. What is the smallest that [facility] can offer? (He was very insistent on this)

## 2022-08-23 NOTE — Telephone Encounter (Signed)
[facility] pharmacy says they can't get the syringles that were sent over for pt but they can fill Relion Syringes if provider is ok with this?

## 2022-08-24 DIAGNOSIS — M1990 Unspecified osteoarthritis, unspecified site: Secondary | ICD-10-CM | POA: Diagnosis not present

## 2022-08-24 DIAGNOSIS — M4802 Spinal stenosis, cervical region: Secondary | ICD-10-CM | POA: Diagnosis not present

## 2022-08-24 DIAGNOSIS — Z794 Long term (current) use of insulin: Secondary | ICD-10-CM | POA: Diagnosis not present

## 2022-08-24 DIAGNOSIS — E871 Hypo-osmolality and hyponatremia: Secondary | ICD-10-CM | POA: Diagnosis not present

## 2022-08-24 DIAGNOSIS — N179 Acute kidney failure, unspecified: Secondary | ICD-10-CM | POA: Diagnosis not present

## 2022-08-24 DIAGNOSIS — E1140 Type 2 diabetes mellitus with diabetic neuropathy, unspecified: Secondary | ICD-10-CM | POA: Diagnosis not present

## 2022-08-24 MED ORDER — "RELION INSULIN SYRINGE 31G X 15/64"" 0.3 ML MISC": 1 refills | Status: DC

## 2022-08-24 NOTE — Telephone Encounter (Signed)
Relion syringes same size as Dr. Darlyn Read ordered originally sent to [facility]

## 2022-08-25 LAB — IRON AND TIBC
Iron Saturation: 14 % — ABNORMAL LOW (ref 15–55)
Iron: 35 ug/dL — ABNORMAL LOW (ref 38–169)
Total Iron Binding Capacity: 244 ug/dL — ABNORMAL LOW (ref 250–450)
UIBC: 209 ug/dL (ref 111–343)

## 2022-08-25 LAB — SPECIMEN STATUS REPORT

## 2022-08-25 LAB — FERRITIN: Ferritin: 214 ng/mL (ref 30–400)

## 2022-08-25 LAB — RETICULOCYTES

## 2022-08-29 DIAGNOSIS — I498 Other specified cardiac arrhythmias: Secondary | ICD-10-CM | POA: Diagnosis not present

## 2022-08-31 DIAGNOSIS — Z794 Long term (current) use of insulin: Secondary | ICD-10-CM | POA: Diagnosis not present

## 2022-08-31 DIAGNOSIS — N179 Acute kidney failure, unspecified: Secondary | ICD-10-CM | POA: Diagnosis not present

## 2022-08-31 DIAGNOSIS — M4802 Spinal stenosis, cervical region: Secondary | ICD-10-CM | POA: Diagnosis not present

## 2022-08-31 DIAGNOSIS — E1140 Type 2 diabetes mellitus with diabetic neuropathy, unspecified: Secondary | ICD-10-CM | POA: Diagnosis not present

## 2022-08-31 DIAGNOSIS — M1990 Unspecified osteoarthritis, unspecified site: Secondary | ICD-10-CM | POA: Diagnosis not present

## 2022-08-31 DIAGNOSIS — E871 Hypo-osmolality and hyponatremia: Secondary | ICD-10-CM | POA: Diagnosis not present

## 2022-09-01 DIAGNOSIS — S91109D Unspecified open wound of unspecified toe(s) without damage to nail, subsequent encounter: Secondary | ICD-10-CM | POA: Diagnosis not present

## 2022-09-01 DIAGNOSIS — E871 Hypo-osmolality and hyponatremia: Secondary | ICD-10-CM | POA: Diagnosis not present

## 2022-09-01 DIAGNOSIS — L97429 Non-pressure chronic ulcer of left heel and midfoot with unspecified severity: Secondary | ICD-10-CM | POA: Diagnosis not present

## 2022-09-01 DIAGNOSIS — N179 Acute kidney failure, unspecified: Secondary | ICD-10-CM | POA: Diagnosis not present

## 2022-09-01 DIAGNOSIS — M1990 Unspecified osteoarthritis, unspecified site: Secondary | ICD-10-CM | POA: Diagnosis not present

## 2022-09-01 DIAGNOSIS — B351 Tinea unguium: Secondary | ICD-10-CM | POA: Diagnosis not present

## 2022-09-01 DIAGNOSIS — M4802 Spinal stenosis, cervical region: Secondary | ICD-10-CM | POA: Diagnosis not present

## 2022-09-01 DIAGNOSIS — E1142 Type 2 diabetes mellitus with diabetic polyneuropathy: Secondary | ICD-10-CM | POA: Diagnosis not present

## 2022-09-01 DIAGNOSIS — Z794 Long term (current) use of insulin: Secondary | ICD-10-CM | POA: Diagnosis not present

## 2022-09-01 DIAGNOSIS — E1140 Type 2 diabetes mellitus with diabetic neuropathy, unspecified: Secondary | ICD-10-CM | POA: Diagnosis not present

## 2022-09-02 DIAGNOSIS — E871 Hypo-osmolality and hyponatremia: Secondary | ICD-10-CM | POA: Diagnosis not present

## 2022-09-02 DIAGNOSIS — N179 Acute kidney failure, unspecified: Secondary | ICD-10-CM | POA: Diagnosis not present

## 2022-09-02 DIAGNOSIS — M1990 Unspecified osteoarthritis, unspecified site: Secondary | ICD-10-CM | POA: Diagnosis not present

## 2022-09-02 DIAGNOSIS — Z794 Long term (current) use of insulin: Secondary | ICD-10-CM | POA: Diagnosis not present

## 2022-09-02 DIAGNOSIS — M4802 Spinal stenosis, cervical region: Secondary | ICD-10-CM | POA: Diagnosis not present

## 2022-09-02 DIAGNOSIS — E1140 Type 2 diabetes mellitus with diabetic neuropathy, unspecified: Secondary | ICD-10-CM | POA: Diagnosis not present

## 2022-09-04 DIAGNOSIS — N179 Acute kidney failure, unspecified: Secondary | ICD-10-CM | POA: Diagnosis not present

## 2022-09-04 DIAGNOSIS — I440 Atrioventricular block, first degree: Secondary | ICD-10-CM | POA: Diagnosis not present

## 2022-09-04 DIAGNOSIS — E860 Dehydration: Secondary | ICD-10-CM | POA: Diagnosis not present

## 2022-09-04 DIAGNOSIS — R600 Localized edema: Secondary | ICD-10-CM | POA: Diagnosis not present

## 2022-09-06 DIAGNOSIS — N179 Acute kidney failure, unspecified: Secondary | ICD-10-CM | POA: Diagnosis not present

## 2022-09-06 DIAGNOSIS — E1140 Type 2 diabetes mellitus with diabetic neuropathy, unspecified: Secondary | ICD-10-CM | POA: Diagnosis not present

## 2022-09-06 DIAGNOSIS — E871 Hypo-osmolality and hyponatremia: Secondary | ICD-10-CM | POA: Diagnosis not present

## 2022-09-06 DIAGNOSIS — Z794 Long term (current) use of insulin: Secondary | ICD-10-CM | POA: Diagnosis not present

## 2022-09-06 DIAGNOSIS — M1990 Unspecified osteoarthritis, unspecified site: Secondary | ICD-10-CM | POA: Diagnosis not present

## 2022-09-06 DIAGNOSIS — M4802 Spinal stenosis, cervical region: Secondary | ICD-10-CM | POA: Diagnosis not present

## 2022-09-14 DIAGNOSIS — E871 Hypo-osmolality and hyponatremia: Secondary | ICD-10-CM | POA: Diagnosis not present

## 2022-09-14 DIAGNOSIS — M1990 Unspecified osteoarthritis, unspecified site: Secondary | ICD-10-CM | POA: Diagnosis not present

## 2022-09-14 DIAGNOSIS — M4802 Spinal stenosis, cervical region: Secondary | ICD-10-CM | POA: Diagnosis not present

## 2022-09-14 DIAGNOSIS — N179 Acute kidney failure, unspecified: Secondary | ICD-10-CM | POA: Diagnosis not present

## 2022-09-14 DIAGNOSIS — E1140 Type 2 diabetes mellitus with diabetic neuropathy, unspecified: Secondary | ICD-10-CM | POA: Diagnosis not present

## 2022-09-14 DIAGNOSIS — Z794 Long term (current) use of insulin: Secondary | ICD-10-CM | POA: Diagnosis not present

## 2022-09-15 DIAGNOSIS — M1990 Unspecified osteoarthritis, unspecified site: Secondary | ICD-10-CM | POA: Diagnosis not present

## 2022-09-15 DIAGNOSIS — M4802 Spinal stenosis, cervical region: Secondary | ICD-10-CM | POA: Diagnosis not present

## 2022-09-15 DIAGNOSIS — E871 Hypo-osmolality and hyponatremia: Secondary | ICD-10-CM | POA: Diagnosis not present

## 2022-09-15 DIAGNOSIS — E1140 Type 2 diabetes mellitus with diabetic neuropathy, unspecified: Secondary | ICD-10-CM | POA: Diagnosis not present

## 2022-09-15 DIAGNOSIS — N179 Acute kidney failure, unspecified: Secondary | ICD-10-CM | POA: Diagnosis not present

## 2022-09-15 DIAGNOSIS — Z794 Long term (current) use of insulin: Secondary | ICD-10-CM | POA: Diagnosis not present

## 2022-09-20 ENCOUNTER — Ambulatory Visit (INDEPENDENT_AMBULATORY_CARE_PROVIDER_SITE_OTHER): Payer: Medicare Other

## 2022-09-20 DIAGNOSIS — M4802 Spinal stenosis, cervical region: Secondary | ICD-10-CM

## 2022-09-20 DIAGNOSIS — Z9181 History of falling: Secondary | ICD-10-CM

## 2022-09-20 DIAGNOSIS — N179 Acute kidney failure, unspecified: Secondary | ICD-10-CM

## 2022-09-20 DIAGNOSIS — E1140 Type 2 diabetes mellitus with diabetic neuropathy, unspecified: Secondary | ICD-10-CM | POA: Diagnosis not present

## 2022-09-20 DIAGNOSIS — Z794 Long term (current) use of insulin: Secondary | ICD-10-CM | POA: Diagnosis not present

## 2022-09-20 DIAGNOSIS — I959 Hypotension, unspecified: Secondary | ICD-10-CM | POA: Diagnosis not present

## 2022-09-20 DIAGNOSIS — E871 Hypo-osmolality and hyponatremia: Secondary | ICD-10-CM

## 2022-09-20 DIAGNOSIS — M1990 Unspecified osteoarthritis, unspecified site: Secondary | ICD-10-CM | POA: Diagnosis not present

## 2022-09-21 ENCOUNTER — Ambulatory Visit (INDEPENDENT_AMBULATORY_CARE_PROVIDER_SITE_OTHER): Payer: Medicare Other | Admitting: Family Medicine

## 2022-09-21 ENCOUNTER — Encounter: Payer: Self-pay | Admitting: Family Medicine

## 2022-09-21 VITALS — BP 95/43 | HR 70 | Temp 97.4°F | Ht 71.0 in | Wt 204.4 lb

## 2022-09-21 DIAGNOSIS — R600 Localized edema: Secondary | ICD-10-CM

## 2022-09-21 DIAGNOSIS — R32 Unspecified urinary incontinence: Secondary | ICD-10-CM | POA: Diagnosis not present

## 2022-09-21 DIAGNOSIS — I878 Other specified disorders of veins: Secondary | ICD-10-CM | POA: Diagnosis not present

## 2022-09-21 LAB — MICROSCOPIC EXAMINATION
Renal Epithel, UA: NONE SEEN /HPF
WBC, UA: >30 /HPF — AB (ref 0–5)
Yeast, UA: NONE SEEN

## 2022-09-21 LAB — URINALYSIS, COMPLETE
Bilirubin, UA: NEGATIVE
Glucose, UA: NEGATIVE
Ketones, UA: NEGATIVE
Nitrite, UA: NEGATIVE
Protein,UA: NEGATIVE
Specific Gravity, UA: 1.015 (ref 1.005–1.030)
Urobilinogen, Ur: 0.2 mg/dL (ref 0.2–1.0)
pH, UA: 6.5 (ref 5.0–7.5)

## 2022-09-21 MED ORDER — TRIAMTERENE-HCTZ 37.5-25 MG PO TABS: 1.0000 | ORAL_TABLET | Freq: Every day | ORAL | 3 refills | Status: DC

## 2022-09-21 MED ORDER — CIPROFLOXACIN HCL 500 MG PO TABS: 500.0000 mg | ORAL_TABLET | Freq: Two times a day (BID) | ORAL | 0 refills | Status: DC

## 2022-09-21 MED ORDER — SOLIFENACIN SUCCINATE 5 MG PO TABS: 5.0000 mg | ORAL_TABLET | Freq: Every day | ORAL | 5 refills | Status: DC

## 2022-09-21 NOTE — Progress Notes (Signed)
Subjective:  Patient ID: WESLEE SILL, male    DOB: [birth year]  Age: 89 y.o. MRN: [id number]  CC: Sore (tailbone) and Urinary Incontinence   HPI [name] presents for 2 weeks of urine leakage when trying to get to the bathroom. Son notes his recliner is moist Pt. Denies incontinence.      09/21/2022    3:41 PM 09/21/2022    3:27 PM 08/22/2022    3:08 PM  Depression screen PHQ 2/9  Decreased Interest 2 0 2  Down, Depressed, Hopeless 1 0 2  PHQ - 2 Score 3 0 4  Altered sleeping 0  1  Tired, decreased energy 2  1  Change in appetite 0  0  Feeling bad or failure about yourself  1  0  Trouble concentrating 0  0  Moving slowly or fidgety/restless 0  0  Suicidal thoughts 0  0  PHQ-9 Score 6  6  Difficult doing work/chores Very difficult  Somewhat difficult    History [name] has a past medical history of Arthritis, Diabetes mellitus without complication (HCC), and S/P knee replacement (left 2011, right 2012).   He has a past surgical history that includes Joint replacement; Back surgery (March 23, 2011); Total hip arthroplasty (11/02/2011); Total hip arthroplasty (Right, 02/24/2012); and Irrigation and debridement knee (Left, 07/29/2014).   His family history is not on file.He reports that he quit smoking about 43 years ago. His smoking use included cigarettes. He started smoking about 63 years ago. He has a 20 pack-year smoking history. He quit smokeless tobacco use about 11 years ago. He reports that he does not currently use alcohol. He reports that he does not use drugs.    ROS Review of Systems  Constitutional:  Negative for fever.  Respiratory:  Negative for shortness of breath.   Cardiovascular:  Negative for chest pain.  Musculoskeletal:  Negative for arthralgias.  Skin:  Negative for rash.    Objective:  BP (!) 95/43   Pulse 70   Temp (!) 97.4 F (36.3 C)   Ht 5\' 11"  (1.803 m)   Wt 204 lb 6.4 oz (92.7 kg)   SpO2 96%   BMI 28.51 kg/m   BP Readings  from Last 3 Encounters:  09/21/22 (!) 95/43  08/22/22 133/65  05/03/22 97/62    Wt Readings from Last 3 Encounters:  09/21/22 204 lb 6.4 oz (92.7 kg)  08/22/22 205 lb 9.6 oz (93.3 kg)  05/03/22 209 lb (94.8 kg)     Physical Exam Vitals reviewed.  Constitutional:      Appearance: He is well-developed.  HENT:     Head: Normocephalic and atraumatic.     Right Ear: External ear normal.     Left Ear: External ear normal.     Mouth/Throat:     Pharynx: No oropharyngeal exudate or posterior oropharyngeal erythema.  Eyes:     Pupils: Pupils are equal, round, and reactive to light.  Cardiovascular:     Rate and Rhythm: Normal rate and regular rhythm.     Heart sounds: No murmur heard. Pulmonary:     Effort: No respiratory distress.     Breath sounds: Normal breath sounds.  Musculoskeletal:     Cervical back: Normal range of motion and neck supple.  Neurological:     Mental Status: He is alert and oriented to person, place, and time.       Assessment & Plan:   [name] was seen today for sore and urinary  incontinence.  Diagnoses and all orders for this visit:  Urinary incontinence, unspecified type -     Urinalysis, Complete -     Urine Culture -     solifenacin (VESICARE) 5 MG tablet; Take 1 tablet (5 mg total) by mouth daily. For bladder control -     CBC with Differential/Platelet -     BMP8+EGFR -     Microscopic Examination  Localized edema -     CBC with Differential/Platelet -     BMP8+EGFR -     triamterene-hydrochlorothiazide (MAXZIDE-25) 37.5-25 MG tablet; Take 1 tablet by mouth daily. For blood pressure and fluid -     Microscopic Examination  Venous stasis -     CBC with Differential/Platelet -     BMP8+EGFR -     Microscopic Examination  Other orders -     ciprofloxacin (CIPRO) 500 MG tablet; Take 1 tablet (500 mg total) by mouth 2 (two) times daily. For prostate. Take all of these.       I am having [name] start on ciprofloxacin,  solifenacin, and triamterene-hydrochlorothiazide. I am also having him maintain his dorzolamide-timolol, insulin NPH-regular Human, midodrine, glucose blood, ibuprofen, cyanocobalamin, and ReliOn Insulin Syringe. We will continue to administer cyanocobalamin.  Allergies as of 09/21/2022       Reactions   Morphine And Codeine Nausea And Vomiting        Medication List        Accurate as of September 21, 2022 11:59 PM. If you have any questions, ask your nurse or doctor.          ciprofloxacin 500 MG tablet Commonly known as: Cipro Take 1 tablet (500 mg total) by mouth 2 (two) times daily. For prostate. Take all of these. Started by: Abbas Beyene   cyanocobalamin 1000 MCG/ML injection Commonly known as: VITAMIN B12 Inject 1 mL (1,000 mcg total) into the skin every 30 (thirty) days.   dorzolamide-timolol 2-0.5 % ophthalmic solution Commonly known as: COSOPT Place 1 drop into both eyes 2 (two) times daily.   glucose blood test strip Test BS daily Dx E11.649   ibuprofen 600 MG tablet Commonly known as: ADVIL Take 1 tablet by mouth three times daily as needed   insulin NPH-regular Human (70-30) 100 UNIT/ML injection Taking 15 units in the morning just before breakfast and 10 just before supper What changed: additional instructions   midodrine 2.5 MG tablet Commonly known as: PROAMATINE Take 1 tablet (2.5 mg total) by mouth 3 (three) times daily with meals.   ReliOn Insulin Syringe 31G X 15/64" 0.3 ML Misc Generic drug: Insulin Syringe-Needle U-100 Used to inject insulin BID prn   solifenacin 5 MG tablet Commonly known as: VESIcare Take 1 tablet (5 mg total) by mouth daily. For bladder control Started by: [name]   triamterene-hydrochlorothiazide 37.5-25 MG tablet Commonly known as: MAXZIDE-25 Take 1 tablet by mouth daily. For blood pressure and fluid Started by: [name]         Follow-up: Return in about 3 weeks (around 10/12/2022).  [name], M.D.

## 2022-09-22 LAB — CBC WITH DIFFERENTIAL/PLATELET
Basophils Absolute: 0 10*3/uL (ref 0.0–0.2)
Basos: 1 %
EOS (ABSOLUTE): 0 10*3/uL (ref 0.0–0.4)
Eos: 0 %
Hematocrit: 32.2 % — ABNORMAL LOW (ref 37.5–51.0)
Hemoglobin: 10.4 g/dL — ABNORMAL LOW (ref 13.0–17.7)
Immature Grans (Abs): 0 10*3/uL (ref 0.0–0.1)
Immature Granulocytes: 1 %
Lymphocytes Absolute: 1.1 10*3/uL (ref 0.7–3.1)
Lymphs: 16 %
MCH: 33.1 pg — ABNORMAL HIGH (ref 26.6–33.0)
MCHC: 32.3 g/dL (ref 31.5–35.7)
MCV: 103 fL — ABNORMAL HIGH (ref 79–97)
Monocytes Absolute: 0.6 10*3/uL (ref 0.1–0.9)
Monocytes: 10 %
Neutrophils Absolute: 4.8 10*3/uL (ref 1.4–7.0)
Neutrophils: 72 %
Platelets: 305 10*3/uL (ref 150–450)
RBC: 3.14 x10E6/uL — ABNORMAL LOW (ref 4.14–5.80)
RDW: 12.4 % (ref 11.6–15.4)
WBC: 6.6 10*3/uL (ref 3.4–10.8)

## 2022-09-22 LAB — BMP8+EGFR
BUN/Creatinine Ratio: 21 (ref 10–24)
BUN: 27 mg/dL (ref 8–27)
CO2: 23 mmol/L (ref 20–29)
Calcium: 9.4 mg/dL (ref 8.6–10.2)
Chloride: 99 mmol/L (ref 96–106)
Creatinine, Ser: 1.3 mg/dL — ABNORMAL HIGH (ref 0.76–1.27)
Glucose: 85 mg/dL (ref 70–99)
Potassium: 5.2 mmol/L (ref 3.5–5.2)
Sodium: 134 mmol/L (ref 134–144)
eGFR: 53 mL/min/{1.73_m2} — ABNORMAL LOW (ref >59)

## 2022-09-24 LAB — URINE CULTURE

## 2022-09-25 ENCOUNTER — Encounter: Payer: Self-pay | Admitting: Family Medicine

## 2022-09-26 ENCOUNTER — Encounter: Payer: Medicare Other | Admitting: Family Medicine

## 2022-09-29 DIAGNOSIS — S91105A Unspecified open wound of left lesser toe(s) without damage to nail, initial encounter: Secondary | ICD-10-CM | POA: Diagnosis not present

## 2022-09-29 DIAGNOSIS — S81801A Unspecified open wound, right lower leg, initial encounter: Secondary | ICD-10-CM | POA: Diagnosis not present

## 2022-09-29 DIAGNOSIS — L97429 Non-pressure chronic ulcer of left heel and midfoot with unspecified severity: Secondary | ICD-10-CM | POA: Diagnosis not present

## 2022-09-29 DIAGNOSIS — E1142 Type 2 diabetes mellitus with diabetic polyneuropathy: Secondary | ICD-10-CM | POA: Diagnosis not present

## 2022-09-29 DIAGNOSIS — S81802A Unspecified open wound, left lower leg, initial encounter: Secondary | ICD-10-CM | POA: Diagnosis not present

## 2022-10-25 DIAGNOSIS — Z23 Encounter for immunization: Secondary | ICD-10-CM | POA: Diagnosis not present

## 2022-10-31 ENCOUNTER — Encounter: Payer: Self-pay | Admitting: Family Medicine

## 2022-10-31 ENCOUNTER — Ambulatory Visit (INDEPENDENT_AMBULATORY_CARE_PROVIDER_SITE_OTHER): Payer: Medicare Other | Admitting: Family Medicine

## 2022-10-31 VITALS — BP 88/45 | HR 68 | Temp 97.3°F | Ht 71.0 in | Wt 195.6 lb

## 2022-10-31 DIAGNOSIS — E11649 Type 2 diabetes mellitus with hypoglycemia without coma: Secondary | ICD-10-CM | POA: Diagnosis not present

## 2022-10-31 DIAGNOSIS — I959 Hypotension, unspecified: Secondary | ICD-10-CM | POA: Diagnosis not present

## 2022-10-31 MED ORDER — MIDODRINE HCL 2.5 MG PO TABS: 2.5000 mg | ORAL_TABLET | Freq: Three times a day (TID) | ORAL | 5 refills | Status: DC

## 2022-10-31 NOTE — Progress Notes (Signed)
Subjective:  Patient ID: [name], male    DOB: Jun 10, 1932  Age: 89 y.o. MRN: [id number]  CC: Medical Management of Chronic Issues   HPI [name] presents forFollow-up of diabetes. Patient checks blood sugar at home.   155 this AM fasting  Patient denies symptoms such as polyuria, polydipsia, excessive hunger, nausea No significant hypoglycemic spells noted. Medications reviewed. Pt reports taking them regularly without complication/adverse reaction being reported today.    History [name] has a past medical history of Arthritis, Diabetes mellitus without complication (HCC), and S/P knee replacement (left 2011, right 2012).   [name] has a past surgical history that includes Joint replacement; Back surgery (March 23, 2011); Total hip arthroplasty (11/02/2011); Total hip arthroplasty (Right, 02/24/2012); and Irrigation and debridement knee (Left, 07/29/2014).   His family history is not on file.[name] reports that [name] quit smoking about 43 years ago. His smoking use included cigarettes. [name] started smoking about 63 years ago. [name] has a 20 pack-year smoking history. [name] quit smokeless tobacco use about 11 years ago. [name] reports that [name] does not currently use alcohol. [name] reports that [name] does not use drugs.  Current Outpatient Medications on File Prior to Visit  Medication Sig Dispense Refill   cyanocobalamin (VITAMIN B12) 1000 MCG/ML injection Inject 1 mL (1,000 mcg total) into the skin every 30 (thirty) days. 3 mL 3   dorzolamide-timolol (COSOPT) 22.3-6.8 MG/ML ophthalmic solution Place 1 drop into both eyes 2 (two) times daily.     insulin NPH-regular Human (70-30) 100 UNIT/ML injection Taking 15 units in the morning just before breakfast and 10 just before supper (Patient taking differently: Taking 15 units in the morning just before breakfast and 12 just before supper) 30 mL 3   Insulin Syringe-Needle U-100 (RELION INSULIN SYRINGE) 31G X 15/64" 0.3 ML MISC Used to inject insulin BID  prn 90 each 1   solifenacin (VESICARE) 5 MG tablet Take 1 tablet (5 mg total) by mouth daily. For bladder control 30 tablet 5   Current Facility-Administered Medications on File Prior to Visit  Medication Dose Route Frequency Provider Last Rate Last Admin   cyanocobalamin (VITAMIN B12) injection 1,000 mcg  1,000 mcg Intramuscular Q30 days [name], MD   1,000 mcg at 08/22/22 1659    ROS Review of Systems  Constitutional:  Negative for fever.  Respiratory:  Negative for shortness of breath.   Cardiovascular:  Negative for chest pain.  Musculoskeletal:  Negative for arthralgias.  Skin:  Negative for rash.    Objective:  BP (!) 88/45   Pulse 68   Temp (!) 97.3 F (36.3 C)   Ht 5\' 11"  (1.803 m)   Wt 195 lb 9.6 oz (88.7 kg)   SpO2 98%   BMI 27.28 kg/m   BP Readings from Last 3 Encounters:  10/31/22 (!) 88/45  09/21/22 (!) 95/43  08/22/22 133/65    Wt Readings from Last 3 Encounters:  10/31/22 195 lb 9.6 oz (88.7 kg)  09/21/22 204 lb 6.4 oz (92.7 kg)  08/22/22 205 lb 9.6 oz (93.3 kg)     Physical Exam Vitals reviewed.  Constitutional:      General: [name] is not in acute distress.    Appearance: [name] is well-developed. [name] is not diaphoretic.  HENT:     Head: Normocephalic and atraumatic.     Right Ear: External ear normal.     Left Ear: External ear normal.     Mouth/Throat:     Pharynx: No  oropharyngeal exudate or posterior oropharyngeal erythema.  Eyes:     Pupils: Pupils are equal, round, and reactive to light.  Cardiovascular:     Rate and Rhythm: Normal rate and regular rhythm.     Heart sounds: No murmur heard. Pulmonary:     Effort: No respiratory distress.     Breath sounds: Normal breath sounds.  Musculoskeletal:     Cervical back: Normal range of motion and neck supple.  Neurological:     Mental Status: [name] is alert and oriented to person, place, and time.       Assessment & Plan:   Fedor was seen today for medical management of chronic  issues.  Diagnoses and all orders for this visit:  Diabetic hypoglycemia (HCC)  Hypotension, unspecified hypotension type  Other orders -     midodrine (PROAMATINE) 2.5 MG tablet; Take 1 tablet (2.5 mg total) by mouth 3 (three) times daily with meals. To raise blood pressure      I have discontinued [name]'s glucose blood, ibuprofen, ciprofloxacin, and triamterene-hydrochlorothiazide. I have also changed his midodrine. Additionally, I am having him maintain his dorzolamide-timolol, insulin NPH-regular Human, cyanocobalamin, ReliOn Insulin Syringe, and solifenacin. We will continue to administer cyanocobalamin.  Meds ordered this encounter  Medications   midodrine (PROAMATINE) 2.5 MG tablet    Sig: Take 1 tablet (2.5 mg total) by mouth 3 (three) times daily with meals. To raise blood pressure    Dispense:  90 tablet    Refill:  5     Follow-up: No follow-ups on file.  [name], M.D.

## 2022-11-02 ENCOUNTER — Ambulatory Visit: Payer: Medicare Other | Admitting: Nurse Practitioner

## 2022-11-02 ENCOUNTER — Encounter (HOSPITAL_COMMUNITY): Payer: Self-pay | Admitting: Radiology

## 2022-11-02 ENCOUNTER — Inpatient Hospital Stay (HOSPITAL_COMMUNITY)
Admission: EM | Admit: 2022-11-02 | Discharge: 2022-11-07 | DRG: 637 | Disposition: A | Discharge status: Home Health | Payer: Medicare Other | Attending: Family Medicine | Admitting: Family Medicine

## 2022-11-02 ENCOUNTER — Emergency Department (HOSPITAL_COMMUNITY): Payer: Medicare Other

## 2022-11-02 ENCOUNTER — Ambulatory Visit: Payer: Medicare Other | Admitting: Family Medicine

## 2022-11-02 DIAGNOSIS — E8770 Fluid overload, unspecified: Secondary | ICD-10-CM | POA: Diagnosis present

## 2022-11-02 DIAGNOSIS — I6782 Cerebral ischemia: Secondary | ICD-10-CM | POA: Diagnosis not present

## 2022-11-02 DIAGNOSIS — E861 Hypovolemia: Secondary | ICD-10-CM | POA: Diagnosis not present

## 2022-11-02 DIAGNOSIS — Z66 Do not resuscitate: Secondary | ICD-10-CM | POA: Diagnosis present

## 2022-11-02 DIAGNOSIS — M159 Polyosteoarthritis, unspecified: Secondary | ICD-10-CM | POA: Diagnosis present

## 2022-11-02 DIAGNOSIS — Z87891 Personal history of nicotine dependence: Secondary | ICD-10-CM

## 2022-11-02 DIAGNOSIS — R404 Transient alteration of awareness: Secondary | ICD-10-CM | POA: Diagnosis not present

## 2022-11-02 DIAGNOSIS — I2510 Atherosclerotic heart disease of native coronary artery without angina pectoris: Secondary | ICD-10-CM | POA: Diagnosis present

## 2022-11-02 DIAGNOSIS — R634 Abnormal weight loss: Secondary | ICD-10-CM | POA: Diagnosis not present

## 2022-11-02 DIAGNOSIS — R338 Other retention of urine: Secondary | ICD-10-CM | POA: Diagnosis not present

## 2022-11-02 DIAGNOSIS — I959 Hypotension, unspecified: Secondary | ICD-10-CM | POA: Diagnosis not present

## 2022-11-02 DIAGNOSIS — G9341 Metabolic encephalopathy: Principal | ICD-10-CM | POA: Diagnosis present

## 2022-11-02 DIAGNOSIS — Z79899 Other long term (current) drug therapy: Secondary | ICD-10-CM

## 2022-11-02 DIAGNOSIS — E162 Hypoglycemia, unspecified: Secondary | ICD-10-CM | POA: Diagnosis not present

## 2022-11-02 DIAGNOSIS — Z96643 Presence of artificial hip joint, bilateral: Secondary | ICD-10-CM | POA: Diagnosis present

## 2022-11-02 DIAGNOSIS — N139 Obstructive and reflux uropathy, unspecified: Secondary | ICD-10-CM | POA: Diagnosis present

## 2022-11-02 DIAGNOSIS — Z515 Encounter for palliative care: Secondary | ICD-10-CM | POA: Diagnosis not present

## 2022-11-02 DIAGNOSIS — E11649 Type 2 diabetes mellitus with hypoglycemia without coma: Principal | ICD-10-CM | POA: Diagnosis present

## 2022-11-02 DIAGNOSIS — Z96653 Presence of artificial knee joint, bilateral: Secondary | ICD-10-CM | POA: Diagnosis present

## 2022-11-02 DIAGNOSIS — Z885 Allergy status to narcotic agent status: Secondary | ICD-10-CM | POA: Diagnosis not present

## 2022-11-02 DIAGNOSIS — M1811 Unilateral primary osteoarthritis of first carpometacarpal joint, right hand: Secondary | ICD-10-CM | POA: Diagnosis not present

## 2022-11-02 DIAGNOSIS — R9431 Abnormal electrocardiogram [ECG] [EKG]: Secondary | ICD-10-CM | POA: Diagnosis not present

## 2022-11-02 DIAGNOSIS — H9190 Unspecified hearing loss, unspecified ear: Secondary | ICD-10-CM | POA: Diagnosis not present

## 2022-11-02 DIAGNOSIS — N179 Acute kidney failure, unspecified: Principal | ICD-10-CM | POA: Diagnosis present

## 2022-11-02 DIAGNOSIS — R339 Retention of urine, unspecified: Secondary | ICD-10-CM | POA: Diagnosis present

## 2022-11-02 DIAGNOSIS — I10 Essential (primary) hypertension: Secondary | ICD-10-CM | POA: Diagnosis not present

## 2022-11-02 DIAGNOSIS — R001 Bradycardia, unspecified: Secondary | ICD-10-CM | POA: Diagnosis present

## 2022-11-02 DIAGNOSIS — I700 Atherosclerosis of aorta: Secondary | ICD-10-CM | POA: Diagnosis not present

## 2022-11-02 DIAGNOSIS — Z7189 Other specified counseling: Secondary | ICD-10-CM | POA: Diagnosis not present

## 2022-11-02 DIAGNOSIS — M79641 Pain in right hand: Secondary | ICD-10-CM | POA: Diagnosis not present

## 2022-11-02 DIAGNOSIS — E878 Other disorders of electrolyte and fluid balance, not elsewhere classified: Secondary | ICD-10-CM | POA: Diagnosis present

## 2022-11-02 DIAGNOSIS — R4182 Altered mental status, unspecified: Secondary | ICD-10-CM | POA: Diagnosis not present

## 2022-11-02 DIAGNOSIS — Z794 Long term (current) use of insulin: Secondary | ICD-10-CM | POA: Diagnosis not present

## 2022-11-02 DIAGNOSIS — R9089 Other abnormal findings on diagnostic imaging of central nervous system: Secondary | ICD-10-CM | POA: Diagnosis not present

## 2022-11-02 DIAGNOSIS — Z4889 Encounter for other specified surgical aftercare: Secondary | ICD-10-CM | POA: Diagnosis not present

## 2022-11-02 DIAGNOSIS — E871 Hypo-osmolality and hyponatremia: Secondary | ICD-10-CM | POA: Diagnosis not present

## 2022-11-02 DIAGNOSIS — R627 Adult failure to thrive: Secondary | ICD-10-CM | POA: Diagnosis not present

## 2022-11-02 DIAGNOSIS — M7989 Other specified soft tissue disorders: Secondary | ICD-10-CM | POA: Diagnosis not present

## 2022-11-02 DIAGNOSIS — E860 Dehydration: Secondary | ICD-10-CM | POA: Diagnosis present

## 2022-11-02 LAB — I-STAT CHEM 8, ED
BUN: 49 mg/dL — ABNORMAL HIGH (ref 8–23)
Calcium, Ion: 1.2 mmol/L (ref 1.15–1.40)
Chloride: 101 mmol/L (ref 98–111)
Creatinine, Ser: 2.8 mg/dL — ABNORMAL HIGH (ref 0.61–1.24)
Glucose, Bld: 79 mg/dL (ref 70–99)
HCT: 29 % — ABNORMAL LOW (ref 39.0–52.0)
Hemoglobin: 9.9 g/dL — ABNORMAL LOW (ref 13.0–17.0)
Potassium: 4.4 mmol/L (ref 3.5–5.1)
Sodium: 132 mmol/L — ABNORMAL LOW (ref 135–145)
TCO2: 18 mmol/L — ABNORMAL LOW (ref 22–32)

## 2022-11-02 LAB — BASIC METABOLIC PANEL
Anion gap: 9 (ref 5–15)
BUN: 60 mg/dL — ABNORMAL HIGH (ref 8–23)
CO2: 20 mmol/L — ABNORMAL LOW (ref 22–32)
Calcium: 8.9 mg/dL (ref 8.9–10.3)
Chloride: 98 mmol/L (ref 98–111)
Creatinine, Ser: 2.31 mg/dL — ABNORMAL HIGH (ref 0.61–1.24)
GFR, Estimated: 27 mL/min — ABNORMAL LOW (ref >60)
Glucose, Bld: 80 mg/dL (ref 70–99)
Potassium: 4.3 mmol/L (ref 3.5–5.1)
Sodium: 127 mmol/L — ABNORMAL LOW (ref 135–145)

## 2022-11-02 LAB — RAPID URINE DRUG SCREEN, HOSP PERFORMED
Amphetamines: NOT DETECTED
Barbiturates: NOT DETECTED
Benzodiazepines: NOT DETECTED
Cocaine: NOT DETECTED
Opiates: NOT DETECTED
Tetrahydrocannabinol: NOT DETECTED

## 2022-11-02 LAB — HEPATIC FUNCTION PANEL
ALT: 14 U/L (ref 0–44)
AST: 29 U/L (ref 15–41)
Albumin: 3.2 g/dL — ABNORMAL LOW (ref 3.5–5.0)
Alkaline Phosphatase: 70 U/L (ref 38–126)
Bilirubin, Direct: 0.1 mg/dL (ref 0.0–0.2)
Indirect Bilirubin: 0.4 mg/dL (ref 0.3–0.9)
Total Bilirubin: 0.5 mg/dL (ref 0.3–1.2)
Total Protein: 7.6 g/dL (ref 6.5–8.1)

## 2022-11-02 LAB — URINALYSIS, W/ REFLEX TO CULTURE (INFECTION SUSPECTED)
Bacteria, UA: NONE SEEN
Bilirubin Urine: NEGATIVE
Glucose, UA: NEGATIVE mg/dL
Hgb urine dipstick: NEGATIVE
Ketones, ur: NEGATIVE mg/dL
Leukocytes,Ua: NEGATIVE
Nitrite: NEGATIVE
Protein, ur: NEGATIVE mg/dL
Specific Gravity, Urine: 1.01 (ref 1.005–1.030)
pH: 5 (ref 5.0–8.0)

## 2022-11-02 LAB — BLOOD GAS, VENOUS
Acid-base deficit: 2.2 mmol/L — ABNORMAL HIGH (ref 0.0–2.0)
Bicarbonate: 23.2 mmol/L (ref 20.0–28.0)
Drawn by: 43739
O2 Saturation: 91.1 %
Patient temperature: 36.6
pCO2, Ven: 40 mm[Hg] — ABNORMAL LOW (ref 44–60)
pH, Ven: 7.37 (ref 7.25–7.43)
pO2, Ven: 55 mm[Hg] — ABNORMAL HIGH (ref 32–45)

## 2022-11-02 LAB — CBC WITH DIFFERENTIAL/PLATELET
Abs Immature Granulocytes: 0.02 10*3/uL (ref 0.00–0.07)
Basophils Absolute: 0 10*3/uL (ref 0.0–0.1)
Basophils Relative: 1 %
Eosinophils Absolute: 0 10*3/uL (ref 0.0–0.5)
Eosinophils Relative: 0 %
HCT: 31 % — ABNORMAL LOW (ref 39.0–52.0)
Hemoglobin: 9.8 g/dL — ABNORMAL LOW (ref 13.0–17.0)
Immature Granulocytes: 0 %
Lymphocytes Relative: 12 %
Lymphs Abs: 0.8 10*3/uL (ref 0.7–4.0)
MCH: 31.8 pg (ref 26.0–34.0)
MCHC: 31.6 g/dL (ref 30.0–36.0)
MCV: 100.6 fL — ABNORMAL HIGH (ref 80.0–100.0)
Monocytes Absolute: 0.9 10*3/uL (ref 0.1–1.0)
Monocytes Relative: 14 %
Neutro Abs: 4.4 10*3/uL (ref 1.7–7.7)
Neutrophils Relative %: 73 %
Platelets: 260 10*3/uL (ref 150–400)
RBC: 3.08 MIL/uL — ABNORMAL LOW (ref 4.22–5.81)
RDW: 13.4 % (ref 11.5–15.5)
WBC: 6.1 10*3/uL (ref 4.0–10.5)
nRBC: 0 % (ref 0.0–0.2)

## 2022-11-02 LAB — MAGNESIUM: Magnesium: 2.5 mg/dL — ABNORMAL HIGH (ref 1.7–2.4)

## 2022-11-02 LAB — TSH: TSH: 2.264 u[IU]/mL (ref 0.350–4.500)

## 2022-11-02 LAB — CBG MONITORING, ED
Glucose-Capillary: 99 mg/dL (ref 70–99)
Glucose-Capillary: 88 mg/dL (ref 70–99)
Glucose-Capillary: 55 mg/dL — ABNORMAL LOW (ref 70–99)
Glucose-Capillary: 115 mg/dL — ABNORMAL HIGH (ref 70–99)
Glucose-Capillary: 75 mg/dL (ref 70–99)
Glucose-Capillary: 86 mg/dL (ref 70–99)

## 2022-11-02 LAB — ETHANOL: Alcohol, Ethyl (B): <10 mg/dL (ref <10)

## 2022-11-02 LAB — LACTIC ACID, PLASMA: Lactic Acid, Venous: 0.7 mmol/L (ref 0.5–1.9)

## 2022-11-02 LAB — AMMONIA: Ammonia: 12 umol/L (ref 9–35)

## 2022-11-02 MED ORDER — DEXTROSE 50 % IV SOLN
1.0000 | Freq: Once | INTRAVENOUS | Status: AC
  Administered 2022-11-02: 50 mL via INTRAVENOUS
  Filled 2022-11-02: qty 50

## 2022-11-02 MED ORDER — DEXTROSE 10 % IV SOLN: Freq: Once | INTRAVENOUS | Status: AC

## 2022-11-02 MED ORDER — SODIUM CHLORIDE 0.9 % IV BOLUS
1000.0000 mL | Freq: Once | INTRAVENOUS | Status: AC
  Administered 2022-11-02: 1000 mL via INTRAVENOUS

## 2022-11-02 MED ORDER — DEXTROSE-SODIUM CHLORIDE 5-0.9 % IV SOLN: INTRAVENOUS | Status: DC

## 2022-11-02 MED ORDER — ACETAMINOPHEN 325 MG PO TABS
650.0000 mg | ORAL_TABLET | Freq: Once | ORAL | Status: AC
  Administered 2022-11-02: 650 mg via ORAL
  Filled 2022-11-02: qty 2

## 2022-11-02 NOTE — ED Notes (Signed)
On assessment, pt nods head, and follows simple commands, but not verbal with this RN.

## 2022-11-02 NOTE — H&P (Signed)
History and Physical    Patient: [name] UUV:[id number] DOB: [birth year] DOA: 11/02/2022 DOS: the patient was seen and examined on 11/02/2022 PCP: [name], MD  Patient coming from: {Point_of_Origin:[address]}  Chief Complaint:  Chief Complaint  Patient presents with   Hypoglycemia   Altered Mental Status   HPI: [name] is a 89 y.o. male with medical history significant of ***  Review of Systems: {ROS_Text:26778} Past Medical History:  Diagnosis Date   Arthritis    Cataracts, both eyes    Diabetes mellitus without complication (HCC)    takes lisinopril for kidneys   S/P hip replacement, bilateral    S/P knee replacement left 2011, right 2012   Past Surgical History:  Procedure Laterality Date   BACK SURGERY  March 23, 2011   lower back    IRRIGATION AND DEBRIDEMENT KNEE Left 07/29/2014   Procedure: IRRIGATION AND DEBRIDEMENT WITH BURSECTOMY OF LEFT  KNEE ;  Surgeon: Ollen Gross, MD;  Location: [facility];  Service: Orthopedics;  Laterality: Left;   JOINT REPLACEMENT     knee X 2    TOTAL HIP ARTHROPLASTY  11/02/2011   Procedure: TOTAL HIP ARTHROPLASTY;  Surgeon: Loanne Drilling, MD;  Location: [facility];  Service: Orthopedics;  Laterality: Left;   TOTAL HIP ARTHROPLASTY Right 02/24/2012   Procedure: TOTAL HIP ARTHROPLASTY;  Surgeon: Loanne Drilling, MD;  Location: [facility];  Service: Orthopedics;  Laterality: Right;   Social History:  reports that he quit smoking about 43 years ago. His smoking use included cigarettes. He started smoking about 63 years ago. He has a 20 pack-year smoking history. He quit smokeless tobacco use about 11 years ago. He reports that he does not currently use alcohol. He reports that he does not use drugs.  Allergies  Allergen Reactions   Morphine And Codeine Nausea And Vomiting   Morphine Nausea And Vomiting    Family History  Problem Relation Age of Onset   Colon cancer Neg Hx     Prior to Admission medications    Medication Sig Start Date End Date Taking? Authorizing Provider  acetaminophen (TYLENOL) 500 MG tablet Take 500 mg by mouth every 6 (six) hours as needed for mild pain (pain score 1-3).   Yes [provider]  cyanocobalamin (VITAMIN B12) 1000 MCG/ML injection Inject 1 mL (1,000 mcg total) into the skin every 30 (thirty) days. 08/22/22  Yes Stacks, Broadus John, MD  dorzolamide-timolol (COSOPT) 22.3-6.8 MG/ML ophthalmic solution Place 1 drop into both eyes 2 (two) times daily.   Yes [provider]  insulin NPH-regular Human (70-30) 100 UNIT/ML injection Taking 15 units in the morning just before breakfast and 10 just before supper Patient taking differently: Inject 10-15 Units into the skin daily with breakfast. 09/23/21  Yes [name], MD  midodrine (PROAMATINE) 2.5 MG tablet Take 1 tablet (2.5 mg total) by mouth 3 (three) times daily with meals. To raise blood pressure 10/31/22  Yes Stacks, Broadus John, MD  Multiple Vitamin (MULTIVITAMIN WITH MINERALS) TABS tablet Take 1 tablet by mouth daily.   Yes [provider]  Sennosides 25 MG TABS Take 1 tablet by mouth See admin instructions. Every third day take 25 mg by mouth.   Yes [provider]  sennosides-docusate sodium (SENOKOT-S) 8.6-50 MG tablet Take 1 tablet by mouth See admin instructions. Every third day take 25 mg by mouth.   Yes [provider]  simethicone (MYLICON) 80 MG chewable tablet Chew 80 mg by mouth every 6 (  six) hours as needed for flatulence.   Yes [provider]  VITAMIN D, ERGOCALCIFEROL, PO Take 1 tablet by mouth daily.   Yes [provider]  Zinc Acetate, Oral, (ZINC ACETATE PO) Take 1 tablet by mouth daily.   Yes [provider]  Insulin Syringe-Needle U-100 (RELION INSULIN SYRINGE) 31G X 15/64" 0.3 ML MISC Used to inject insulin BID prn 08/24/22   [name], MD  solifenacin (VESICARE) 5 MG tablet Take 1 tablet (5 mg total) by mouth daily. For bladder  control Patient taking differently: Take 5 mg by mouth daily. Discontinued by provider on 10-24-22. 09/21/22   [name], MD    Physical Exam: Vitals:   11/02/22 1930 11/02/22 2025 11/02/22 2045 11/02/22 2140  BP: 129/69 118/62 (!) 149/63 (!) 118/37  Pulse: 72 68 68 76  Resp: 15 17 20 18   Temp: 97.7 F (36.5 C) 98.1 F (36.7 C)    TempSrc: Oral Oral    SpO2: 95% 100% 100% 95%   *** Data Reviewed: {Tip this will not be part of the note when signed- Document your independent interpretation of telemetry tracing, EKG, lab, Radiology test or any other diagnostic tests. Add any new diagnostic test ordered today. (Optional):26781} {Results:26384}  Assessment and Plan: No notes have been filed under this hospital service. Service: Hospitalist     Advance Care Planning:   Code Status: Full Code ***  Consults: ***  Family Communication: ***  Severity of Illness: {Observation/Inpatient:21159}  Author: Lilyan Gilford, DO 11/02/2022 11:25 PM  For on call review [email or web address].

## 2022-11-02 NOTE — ED Notes (Signed)
Pt requesting tylenol for chronic back pain.  

## 2022-11-02 NOTE — ED Notes (Signed)
Ok per [place], [name] if pt eats- pt given [place] sandwich and ice water.

## 2022-11-02 NOTE — ED Notes (Signed)
ED TO INPATIENT HANDOFF REPORT  ED Nurse Name and Phone #: Niranjan Rufener [phone]  S Name/Age/Gender [place] 89 y.o. male Room/Bed: APA04/APA04  Code Status   Code Status: Full Code  Home/SNF/Other Home Patient oriented to: self, place, time, and situation Is this baseline? Yes   Triage Complete: Triage complete  Chief Complaint Acute metabolic encephalopathy [G93.41]  Triage Note Pt to ED via [facility] from home c/o hypoglycemia. Pt initial cbg 39. Pt given d10, initial recheck 206, 10 mins later cbg 150. Pt also AMS. Apparently pt baseline is A&O X 4, but per family, past 4 days more confused and non verbal. Family noticed foul urine.  #20 R forearm.   Last VS: 110/80, P 64, 100%RA. Temp 98.1. AV40    Allergies Allergies  Allergen Reactions   Morphine And Codeine Nausea And Vomiting   Morphine Nausea And Vomiting    Level of Care/Admitting Diagnosis ED Disposition     ED Disposition  Admit   Condition  --   Comment  Hospital Area: [facility] [[id number]]  Level of Care: Telemetry [5]  Covid Evaluation: Asymptomatic - no recent exposure (last 10 days) testing not required  Diagnosis: Acute metabolic encephalopathy [9811914]  Admitting Physician: Lilyan Gilford [[id number]]  Attending Physician: Lilyan Gilford [[id number]]  Certification:: I certify this patient will need inpatient services for at least 2 midnights  Expected Medical Readiness: 11/06/2022          B Medical/Surgery History Past Medical History:  Diagnosis Date   Arthritis    Cataracts, both eyes    Diabetes mellitus without complication (HCC)    takes lisinopril for kidneys   S/P hip replacement, bilateral    S/P knee replacement left 2011, right 2012   Past Surgical History:  Procedure Laterality Date   BACK SURGERY  March 23, 2011   lower back    IRRIGATION AND DEBRIDEMENT KNEE Left 07/29/2014   Procedure: IRRIGATION AND DEBRIDEMENT WITH BURSECTOMY OF LEFT  KNEE  ;  Surgeon: Ollen Gross, MD;  Location: [facility];  Service: Orthopedics;  Laterality: Left;   JOINT REPLACEMENT     knee X 2    TOTAL HIP ARTHROPLASTY  11/02/2011   Procedure: TOTAL HIP ARTHROPLASTY;  Surgeon: Loanne Drilling, MD;  Location: [facility];  Service: Orthopedics;  Laterality: Left;   TOTAL HIP ARTHROPLASTY Right 02/24/2012   Procedure: TOTAL HIP ARTHROPLASTY;  Surgeon: Loanne Drilling, MD;  Location: [facility];  Service: Orthopedics;  Laterality: Right;     A IV Location/Drains/Wounds Patient Lines/Drains/Airways Status     Active Line/Drains/Airways     Name Placement date Placement time Site Days   Peripheral IV 11/02/22 20 G Right Arm 11/02/22  1439  Arm  less than 1   Urethral Catheter NJG Triple-lumen 16 Fr. 11/02/22  1727  Triple-lumen  less than 1   Incision (Closed) 07/29/14 Knee Left 07/29/14  1651  -- 3018   Wound / Incision (Open or Dehisced) 07/28/14 Leg 07/28/14  1945  Leg  3019            Intake/Output Last 24 hours  Intake/Output Summary (Last 24 hours) at 11/02/2022 2327 Last data filed at 11/02/2022 1757 Gross per 24 hour  Intake --  Output 2400 ml  Net -2400 ml    Labs/Imaging Results for orders placed or performed during the hospital encounter of 11/02/22 (from the past 48 hour(s))  CBG monitoring, ED     Status: None  Collection Time: 11/02/22  2:21 PM  Result Value Ref Range   Glucose-Capillary 99 70 - 99 mg/dL    Comment: Glucose reference range applies only to samples taken after fasting for at least 8 hours.  CBG monitoring, ED     Status: None   Collection Time: 11/02/22  2:42 PM  Result Value Ref Range   Glucose-Capillary 88 70 - 99 mg/dL    Comment: Glucose reference range applies only to samples taken after fasting for at least 8 hours.  Ammonia     Status: None   Collection Time: 11/02/22  2:48 PM  Result Value Ref Range   Ammonia 12 9 - 35 umol/L    Comment: Performed at [facility], [address] [place], [place] [address]   Lactic acid, plasma     Status: None   Collection Time: 11/02/22  2:48 PM  Result Value Ref Range   Lactic Acid, Venous 0.7 0.5 - 1.9 mmol/L    Comment: Performed at [facility], [address] [place], [place] [address]  Blood gas, venous     Status: Abnormal   Collection Time: 11/02/22  2:48 PM  Result Value Ref Range   pH, Ven 7.37 7.25 - 7.43   pCO2, Ven 40 (L) 44 - 60 mmHg   pO2, Ven 55 (H) 32 - 45 mmHg   Bicarbonate 23.2 20.0 - 28.0 mmol/L   Acid-base deficit 2.2 (H) 0.0 - 2.0 mmol/L   O2 Saturation 91.1 %   Patient temperature 36.6    Collection site BLOOD RIGHT ARM    Drawn by [phone]     Comment: Performed at [facility], [address] [place], [place] [address]  TSH     Status: None   Collection Time: 11/02/22  2:48 PM  Result Value Ref Range   TSH 2.264 0.350 - 4.500 uIU/mL    Comment: Performed by a 3rd Generation assay with a functional sensitivity of <=0.01 uIU/mL. Performed at [facility], [address] [place], [place] [address]   Ethanol     Status: None   Collection Time: 11/02/22  2:48 PM  Result Value Ref Range   Alcohol, Ethyl (B) <10 <10 mg/dL    Comment: (NOTE) Lowest detectable limit for serum alcohol is 10 mg/dL.  For medical purposes only. Performed at [facility], [address] [place], [place] [address]   CBC WITH DIFFERENTIAL     Status: Abnormal   Collection Time: 11/02/22  2:56 PM  Result Value Ref Range   WBC 6.1 4.0 - 10.5 K/uL   RBC 3.08 (L) 4.22 - 5.81 MIL/uL   Hemoglobin 9.8 (L) 13.0 - 17.0 g/dL   HCT 52.8 (L) 41.3 - 24.4 %   MCV 100.6 (H) 80.0 - 100.0 fL   MCH 31.8 26.0 - 34.0 pg   MCHC 31.6 30.0 - 36.0 g/dL   RDW 01.0 27.2 - 53.6 %   Platelets 260 150 - 400 K/uL   nRBC 0.0 0.0 - 0.2 %   Neutrophils Relative % 73 %   Neutro Abs 4.4 1.7 - 7.7 K/uL   Lymphocytes Relative 12 %   Lymphs Abs 0.8 0.7 - 4.0 K/uL   Monocytes Relative 14 %   Monocytes Absolute 0.9 0.1 - 1.0 K/uL   Eosinophils Relative 0 %   Eosinophils Absolute  0.0 0.0 - 0.5 K/uL   Basophils Relative 1 %   Basophils Absolute 0.0 0.0 - 0.1 K/uL   Immature Granulocytes 0 %  Abs Immature Granulocytes 0.02 0.00 - 0.07 K/uL    Comment: Performed at [facility], [address] [place], [place] [address]  Basic metabolic panel     Status: Abnormal   Collection Time: 11/02/22  2:56 PM  Result Value Ref Range   Sodium 127 (L) 135 - 145 mmol/L   Potassium 4.3 3.5 - 5.1 mmol/L   Chloride 98 98 - 111 mmol/L   CO2 20 (L) 22 - 32 mmol/L   Glucose, Bld 80 70 - 99 mg/dL    Comment: Glucose reference range applies only to samples taken after fasting for at least 8 hours.   BUN 60 (H) 8 - 23 mg/dL   Creatinine, Ser 4.13 (H) 0.61 - 1.24 mg/dL   Calcium 8.9 8.9 - 24.4 mg/dL   GFR, Estimated 27 (L) >60 mL/min    Comment: (NOTE) Calculated using the CKD-EPI Creatinine Equation (2021)    Anion gap 9 5 - 15    Comment: Performed at [facility], [address], [place], [place] [address]  Hepatic function panel     Status: Abnormal   Collection Time: 11/02/22  2:56 PM  Result Value Ref Range   Total Protein 7.6 6.5 - 8.1 g/dL   Albumin 3.2 (L) 3.5 - 5.0 g/dL   AST 29 15 - 41 U/L   ALT 14 0 - 44 U/L   Alkaline Phosphatase 70 38 - 126 U/L   Total Bilirubin 0.5 0.3 - 1.2 mg/dL   Bilirubin, Direct 0.1 0.0 - 0.2 mg/dL   Indirect Bilirubin 0.4 0.3 - 0.9 mg/dL    Comment: Performed at [facility], [address] [place], [place] [address]  Magnesium     Status: Abnormal   Collection Time: 11/02/22  2:56 PM  Result Value Ref Range   Magnesium 2.5 (H) 1.7 - 2.4 mg/dL    Comment: Performed at [facility], [address], [place], [place] [address]  I-Stat Chem 8, ED     Status: Abnormal   Collection Time: 11/02/22  3:07 PM  Result Value Ref Range   Sodium 132 (L) 135 - 145 mmol/L   Potassium 4.4 3.5 - 5.1 mmol/L   Chloride 101 98 - 111 mmol/L   BUN 49 (H) 8 - 23 mg/dL   Creatinine, Ser 7.42 (H) 0.61 - 1.24 mg/dL   Glucose, Bld 79 70 - 99 mg/dL    Comment:  Glucose reference range applies only to samples taken after fasting for at least 8 hours.   Calcium, Ion 1.20 1.15 - 1.40 mmol/L   TCO2 18 (L) 22 - 32 mmol/L   Hemoglobin 9.9 (L) 13.0 - 17.0 g/dL   HCT 59.5 (L) 63.8 - 75.6 %  CBG monitoring, ED     Status: Abnormal   Collection Time: 11/02/22  3:47 PM  Result Value Ref Range   Glucose-Capillary 55 (L) 70 - 99 mg/dL    Comment: Glucose reference range applies only to samples taken after fasting for at least 8 hours.  Urine rapid drug screen (hosp performed)     Status: None   Collection Time: 11/02/22  4:35 PM  Result Value Ref Range   Opiates NONE DETECTED NONE DETECTED   Cocaine NONE DETECTED NONE DETECTED   Benzodiazepines NONE DETECTED NONE DETECTED   Amphetamines NONE DETECTED NONE DETECTED   Tetrahydrocannabinol NONE DETECTED NONE DETECTED   Barbiturates NONE DETECTED NONE DETECTED    Comment: (NOTE) DRUG SCREEN FOR MEDICAL PURPOSES ONLY.  IF CONFIRMATION IS NEEDED  FOR ANY PURPOSE, NOTIFY LAB WITHIN 5 DAYS.  LOWEST DETECTABLE LIMITS FOR URINE DRUG SCREEN Drug Class                     Cutoff (ng/mL) Amphetamine and metabolites    1000 Barbiturate and metabolites    200 Benzodiazepine                 200 Opiates and metabolites        300 Cocaine and metabolites        300 THC                            50 Performed at [facility], [address] [place], [place] [address]   Urinalysis, w/ Reflex to Culture (Infection Suspected) -Urine, Clean Catch     Status: None   Collection Time: 11/02/22  4:35 PM  Result Value Ref Range   Specimen Source URINE, CLEAN CATCH    Color, Urine YELLOW YELLOW   APPearance CLEAR CLEAR   Specific Gravity, Urine 1.010 1.005 - 1.030   pH 5.0 5.0 - 8.0   Glucose, UA NEGATIVE NEGATIVE mg/dL   Hgb urine dipstick NEGATIVE NEGATIVE   Bilirubin Urine NEGATIVE NEGATIVE   Ketones, ur NEGATIVE NEGATIVE mg/dL   Protein, ur NEGATIVE NEGATIVE mg/dL   Nitrite NEGATIVE NEGATIVE   Leukocytes,Ua  NEGATIVE NEGATIVE   RBC / HPF 0-5 0 - 5 RBC/hpf   WBC, UA 0-5 0 - 5 WBC/hpf    Comment:        Reflex urine culture not performed if WBC <=10, OR if Squamous epithelial cells >5. If Squamous epithelial cells >5 suggest recollection.    Bacteria, UA NONE SEEN NONE SEEN   Squamous Epithelial / HPF 0-5 0 - 5 /HPF    Comment: Performed at [facility], [address] [place], [place] [address]  CBG monitoring, ED     Status: Abnormal   Collection Time: 11/02/22  5:55 PM  Result Value Ref Range   Glucose-Capillary 115 (H) 70 - 99 mg/dL    Comment: Glucose reference range applies only to samples taken after fasting for at least 8 hours.  POC CBG, ED     Status: None   Collection Time: 11/02/22  7:03 PM  Result Value Ref Range   Glucose-Capillary 75 70 - 99 mg/dL    Comment: Glucose reference range applies only to samples taken after fasting for at least 8 hours.  CBG monitoring, ED     Status: None   Collection Time: 11/02/22  7:46 PM  Result Value Ref Range   Glucose-Capillary 86 70 - 99 mg/dL    Comment: Glucose reference range applies only to samples taken after fasting for at least 8 hours.   CT CHEST ABDOMEN PELVIS WO CONTRAST  Result Date: 11/02/2022 CLINICAL DATA:  Unintended weight loss. EXAM: CT CHEST, ABDOMEN AND PELVIS WITHOUT CONTRAST TECHNIQUE: Multidetector CT imaging of the chest, abdomen and pelvis was performed following the standard protocol without IV contrast. RADIATION DOSE REDUCTION: This exam was performed according to the departmental dose-optimization program which includes automated exposure control, adjustment of the mA and/or kV according to patient size and/or use of iterative reconstruction technique. COMPARISON:  June 23, 2009. FINDINGS: CT CHEST FINDINGS Cardiovascular: Atherosclerosis of thoracic aorta without aneurysm formation. Normal cardiac size. No pericardial effusion. Extensive coronary artery calcifications are noted. Mediastinum/Nodes: No enlarged  mediastinal, hilar, or axillary lymph nodes.  Thyroid gland, trachea, and esophagus demonstrate no significant findings. Lungs/Pleura: No pneumothorax or pleural effusion is noted. Minimal bibasilar subsegmental atelectasis or scarring is noted. Cluster of irregular densities are noted posteriorly in right upper lobe most consistent with atypical inflammation or possibly scarring. Musculoskeletal: No chest wall mass or suspicious bone lesions identified. CT ABDOMEN PELVIS FINDINGS Hepatobiliary: No focal liver abnormality is seen. No gallstones, gallbladder wall thickening, or biliary dilatation. Pancreas: Fatty replacement of the pancreas is noted. Spleen: Normal in size without focal abnormality. Adrenals/Urinary Tract: Adrenal glands and kidneys are unremarkable. Urinary bladder is not well visualized due to scatter artifact arising from bilateral hip arthroplasties. Stomach/Bowel: Stomach is unremarkable. There is no evidence of bowel obstruction or inflammation. Vascular/Lymphatic: Aortic atherosclerosis. No enlarged abdominal or pelvic lymph nodes. Reproductive: Prostate is not well visualized due to scatter artifact arising from bilateral hip arthroplasties. Other: No abdominal wall hernia or abnormality. No abdominopelvic ascites. Musculoskeletal: There is irregularity involving the adjacent endplates of T12-L1 which most likely represents degenerative change, but discitis cannot be excluded. IMPRESSION: Cluster of irregular nodular densities are noted posteriorly in right upper lobe most consistent with atypical inflammation or mycobacterium. Follow-up chest CT in 2-3 weeks is recommended to ensure resolution or stability. Irregularity involving the adjacent endplates of T12-L1 which most likely represents progressive degenerative disc disease, but discitis cannot be excluded. Extensive coronary artery calcifications are noted suggesting coronary artery disease. Fatty replacement of the pancreas. Urinary  bladder and prostate gland are not well visualized due to scatter artifact arising from bilateral hip arthroplasties. Aortic Atherosclerosis (ICD10-I70.0). Electronically Signed   By: Lupita Raider M.D.   On: 11/02/2022 20:55   CT HEAD WO CONTRAST  Result Date: 11/02/2022 CLINICAL DATA:  Altered mental status and hypoglycemia EXAM: CT HEAD WITHOUT CONTRAST TECHNIQUE: Contiguous axial images were obtained from the base of the skull through the vertex without intravenous contrast. RADIATION DOSE REDUCTION: This exam was performed according to the departmental dose-optimization program which includes automated exposure control, adjustment of the mA and/or kV according to patient size and/or use of iterative reconstruction technique. COMPARISON:  07/27/2022 FINDINGS: Brain: No evidence of acute infarction, hemorrhage, hydrocephalus, extra-axial collection or mass lesion/mass effect. Mild atrophic and chronic white matter ischemic changes are noted. Vascular: No hyperdense vessel or unexpected calcification. Skull: Normal. Negative for fracture or focal lesion. Sinuses/Orbits: No acute finding. Other: None. IMPRESSION: Chronic atrophic and ischemic changes without acute abnormality. Electronically Signed   By: Alcide Clever M.D.   On: 11/02/2022 17:45   DG Chest Port 1 View  Result Date: 11/02/2022 CLINICAL DATA:  Altered mental status and weight loss, initial encounter EXAM: PORTABLE CHEST 1 VIEW COMPARISON:  07/27/2022 FINDINGS: Cardiac shadow is within normal limits. Aortic calcifications are noted. The lungs are well aerated bilaterally. No focal infiltrate is seen. Multiple old rib fractures are noted on the left. IMPRESSION: No acute abnormality noted. Electronically Signed   By: Alcide Clever M.D.   On: 11/02/2022 17:43    Pending Labs Unresulted Labs (From admission, onward)     Start     Ordered   11/02/22 2316  Basic metabolic panel  Once,   R        11/02/22 2315   Signed and Held   Comprehensive metabolic panel  Tomorrow morning,   R        Signed and Held   Signed and Held  Magnesium  Tomorrow morning,   R  Signed and Held   Signed and Held  CBC with Differential/Platelet  Tomorrow morning,   R        Signed and Held   Signed and Held  TSH  Tomorrow morning,   R        Signed and Held   Signed and Held  Hemoglobin A1c  Tomorrow morning,   R        Signed and Held            Vitals/Pain Today's Vitals   11/02/22 2025 11/02/22 2045 11/02/22 2140 11/02/22 2236  BP: 118/62 (!) 149/63 (!) 118/37   Pulse: 68 68 76   Resp: 17 20 18    Temp: 98.1 F (36.7 C)     TempSrc: Oral     SpO2: 100% 100% 95%   PainSc:    Asleep    Isolation Precautions No active isolations  Medications Medications  dextrose 5 %-0.9 % sodium chloride infusion (has no administration in time range)  dextrose 50 % solution 50 mL (50 mLs Intravenous Given 11/02/22 1600)  dextrose 10 % infusion ( Intravenous Rate/Dose Verify 11/02/22 2033)  sodium chloride 0.9 % bolus 1,000 mL (0 mLs Intravenous Stopped 11/02/22 1702)  acetaminophen (TYLENOL) tablet 650 mg (650 mg Oral Given 11/02/22 2204)    Mobility walks with device     Focused Assessments    R Recommendations: See Admitting Provider Note  Report given to:   Additional Notes:

## 2022-11-02 NOTE — ED Notes (Signed)
Pt transported to MRI 

## 2022-11-02 NOTE — ED Provider Notes (Signed)
Patient signed out to me by Carmel Sacramento, PA-C pending MRI results.  Patient was brought in from home for evaluation of hypoglycemia and confusion.  Family had noted foul-smelling urine.  His workup today was positive for hypoglycemia he is required D50 and D10 drip.  Workup also positive for AKI and acute urinary retention.  Foley catheter was placed.  No evidence of sepsis or UTI on his workup. mild hyponatremia felt to be secondary to decreased p.o. intake given IV fluids here.  See previous provider note for complete H&P.  Patient unwilling to speak to me, nods his head when asked if he is okay and shakes his head when asked if he needs anything  MRI brain was also ordered, results are pending.  I have called [facility] radiology requesting results, informed that only 1 radiologist available for MRI reads and that he is currently behind.  Patient will need hospital admission for his AKI and acute urinary retention.  Discussed findings with [facility] hospitalist, Dr. Carren Rang who agrees to admit   CT CHEST ABDOMEN PELVIS WO CONTRAST  Result Date: 11/02/2022 CLINICAL DATA:  Unintended weight loss. EXAM: CT CHEST, ABDOMEN AND PELVIS WITHOUT CONTRAST TECHNIQUE: Multidetector CT imaging of the chest, abdomen and pelvis was performed following the standard protocol without IV contrast. RADIATION DOSE REDUCTION: This exam was performed according to the departmental dose-optimization program which includes automated exposure control, adjustment of the mA and/or kV according to patient size and/or use of iterative reconstruction technique. COMPARISON:  June 23, 2009. FINDINGS: CT CHEST FINDINGS Cardiovascular: Atherosclerosis of thoracic aorta without aneurysm formation. Normal cardiac size. No pericardial effusion. Extensive coronary artery calcifications are noted. Mediastinum/Nodes: No enlarged mediastinal, hilar, or axillary lymph nodes. Thyroid gland, trachea, and esophagus demonstrate no  significant findings. Lungs/Pleura: No pneumothorax or pleural effusion is noted. Minimal bibasilar subsegmental atelectasis or scarring is noted. Cluster of irregular densities are noted posteriorly in right upper lobe most consistent with atypical inflammation or possibly scarring. Musculoskeletal: No chest wall mass or suspicious bone lesions identified. CT ABDOMEN PELVIS FINDINGS Hepatobiliary: No focal liver abnormality is seen. No gallstones, gallbladder wall thickening, or biliary dilatation. Pancreas: Fatty replacement of the pancreas is noted. Spleen: Normal in size without focal abnormality. Adrenals/Urinary Tract: Adrenal glands and kidneys are unremarkable. Urinary bladder is not well visualized due to scatter artifact arising from bilateral hip arthroplasties. Stomach/Bowel: Stomach is unremarkable. There is no evidence of bowel obstruction or inflammation. Vascular/Lymphatic: Aortic atherosclerosis. No enlarged abdominal or pelvic lymph nodes. Reproductive: Prostate is not well visualized due to scatter artifact arising from bilateral hip arthroplasties. Other: No abdominal wall hernia or abnormality. No abdominopelvic ascites. Musculoskeletal: There is irregularity involving the adjacent endplates of T12-L1 which most likely represents degenerative change, but discitis cannot be excluded. IMPRESSION: Cluster of irregular nodular densities are noted posteriorly in right upper lobe most consistent with atypical inflammation or mycobacterium. Follow-up chest CT in 2-3 weeks is recommended to ensure resolution or stability. Irregularity involving the adjacent endplates of T12-L1 which most likely represents progressive degenerative disc disease, but discitis cannot be excluded. Extensive coronary artery calcifications are noted suggesting coronary artery disease. Fatty replacement of the pancreas. Urinary bladder and prostate gland are not well visualized due to scatter artifact arising from bilateral hip  arthroplasties. Aortic Atherosclerosis (ICD10-I70.0). Electronically Signed   By: Lupita Raider M.D.   On: 11/02/2022 20:55   CT HEAD WO CONTRAST  Result Date: 11/02/2022 CLINICAL DATA:  Altered mental status and hypoglycemia EXAM:  CT HEAD WITHOUT CONTRAST TECHNIQUE: Contiguous axial images were obtained from the base of the skull through the vertex without intravenous contrast. RADIATION DOSE REDUCTION: This exam was performed according to the departmental dose-optimization program which includes automated exposure control, adjustment of the mA and/or kV according to patient size and/or use of iterative reconstruction technique. COMPARISON:  07/27/2022 FINDINGS: Brain: No evidence of acute infarction, hemorrhage, hydrocephalus, extra-axial collection or mass lesion/mass effect. Mild atrophic and chronic white matter ischemic changes are noted. Vascular: No hyperdense vessel or unexpected calcification. Skull: Normal. Negative for fracture or focal lesion. Sinuses/Orbits: No acute finding. Other: None. IMPRESSION: Chronic atrophic and ischemic changes without acute abnormality. Electronically Signed   By: Alcide Clever M.D.   On: 11/02/2022 17:45   DG Chest Port 1 View  Result Date: 11/02/2022 CLINICAL DATA:  Altered mental status and weight loss, initial encounter EXAM: PORTABLE CHEST 1 VIEW COMPARISON:  07/27/2022 FINDINGS: Cardiac shadow is within normal limits. Aortic calcifications are noted. The lungs are well aerated bilaterally. No focal infiltrate is seen. Multiple old rib fractures are noted on the left. IMPRESSION: No acute abnormality noted. Electronically Signed   By: Alcide Clever M.D.   On: 11/02/2022 17:43      Pauline Aus, PA-C 11/02/22 2314    Edwin Dada P, DO 11/10/22 0710

## 2022-11-02 NOTE — ED Notes (Signed)
Pt more alert. Asked for more sheets. Educated on the need for foley cathter placement. Pt verbalized understanding.

## 2022-11-02 NOTE — ED Triage Notes (Addendum)
Pt to ED via [facility] from home c/o hypoglycemia. Pt initial cbg 39. Pt given d10, initial recheck 206, 10 mins later cbg 150. Pt also AMS. Apparently pt baseline is A&O X 4, but per family, past 4 days more confused and non verbal. Family noticed foul urine.  #20 R forearm.   Last VS: 110/80, P 64, 100%RA. Temp 98.1. ZO10

## 2022-11-02 NOTE — ED Notes (Signed)
Bladder scanned pt. found

## 2022-11-02 NOTE — ED Notes (Addendum)
CBG: 115 

## 2022-11-02 NOTE — ED Provider Notes (Signed)
[place] EMERGENCY DEPARTMENT AT [facility] Provider Note   CSN: [id number] Arrival date & time: 11/02/22  1411     History  Chief Complaint  Patient presents with   Hypoglycemia   Altered Mental Status    [name] is a 89 y.o. male.  He has PMH of diabetes, grade 1 diastolic dysfunction on echo in July of this year.  He presents the ER today with his son for further evaluation.  Son reports for the past several days he had some increased somnolence and has been "seeing things".  Had strong smelling urine, patient states his wife is a Engineer, civil (consulting) and noted that he may have a UTI.  They brought him into the ER today specifically because he asked his son to give him his insulin this morning, was not given his usual dose of insulin and was giving him breakfast and noticed his blood sugar was low when he rechecked it so he gave him juice and more food.  His sugar came up to the 80s but then dropped back down to the 50s, EMS came, gave patient D10 and route.  Patient's son knows he is been having trouble with low blood pressure recently, recently had his triamterene hydrochlorothiazide stopped and his Vesicare stopped and was started on midodrine 3 times daily this was about 2 weeks ago he reports.   Hypoglycemia Associated symptoms: altered mental status   Altered Mental Status      Home Medications Prior to Admission medications   Medication Sig Start Date End Date Taking? Authorizing Provider  cyanocobalamin (VITAMIN B12) 1000 MCG/ML injection Inject 1 mL (1,000 mcg total) into the skin every 30 (thirty) days. 08/22/22   [name], MD  dorzolamide-timolol (COSOPT) 22.3-6.8 MG/ML ophthalmic solution Place 1 drop into both eyes 2 (two) times daily.    [provider]  insulin NPH-regular Human (70-30) 100 UNIT/ML injection Taking 15 units in the morning just before breakfast and 10 just before supper Patient taking differently: Taking 15 units in the  morning just before breakfast and 12 just before supper 09/23/21   [name], MD  Insulin Syringe-Needle U-100 (RELION INSULIN SYRINGE) 31G X 15/64" 0.3 ML MISC Used to inject insulin BID prn 08/24/22   [name], MD  midodrine (PROAMATINE) 2.5 MG tablet Take 1 tablet (2.5 mg total) by mouth 3 (three) times daily with meals. To raise blood pressure 10/31/22   [name], MD  solifenacin (VESICARE) 5 MG tablet Take 1 tablet (5 mg total) by mouth daily. For bladder control 09/21/22   [name], MD      Allergies    Morphine and codeine    Review of Systems   Review of Systems  Physical Exam Updated Vital Signs BP 133/61 (BP Location: Left Arm)   Pulse (!) 58   Temp 98.3 F (36.8 C) (Oral)   Resp 16   SpO2 98%  Physical Exam  ED Results / Procedures / Treatments   Labs (all labs ordered are listed, but only abnormal results are displayed) Labs Reviewed  CBC WITH DIFFERENTIAL/PLATELET  AMMONIA  BASIC METABOLIC PANEL  HEPATIC FUNCTION PANEL  MAGNESIUM  LACTIC ACID, PLASMA  BLOOD GAS, VENOUS  TSH  ETHANOL  RAPID URINE DRUG SCREEN, HOSP PERFORMED  URINALYSIS, W/ REFLEX TO CULTURE (INFECTION SUSPECTED)  CBG MONITORING, ED  CBG MONITORING, ED  I-STAT CHEM 8, ED    EKG None  Radiology No results found.  Procedures Procedures    Medications Ordered  in ED Medications - No data to display  ED Course/ Medical Decision Making/ A&P Clinical Course as of 11/02/22 1845  Wed Nov 02, 2022  1843 MR BRAIN WO CONTRAST [CB]    Clinical Course User Index [CB] Ma Rings, PA-C                                 Medical Decision Making This patient presents to the ED for concern of altered mental status, hypoglycemia, this involves an extensive number of treatment options, and is a complaint that carries with it a high risk of complications and morbidity.  The differential diagnosis includes AKI, medication reaction, UTI, sepsis, CVA, metabolic  encephalopathy, electrolyte derangement, other     Additional history obtained:  Additional history obtained from EMR External records from outside source obtained and reviewed including notes, labs   Lab Tests:  I Ordered, and personally interpreted labs.  The pertinent results include: Patient has AKI, no UTI, hypoglycemic which is corrected with D50, mild hyponatremia   Imaging Studies ordered:  I ordered imaging studies including CT head which shows no acute intracranial hemorrhage or large territory infarct I independently visualized and interpreted imaging within scope of identifying emergent findings  I agree with the radiologist interpretation   Cardiac Monitoring: / EKG:  The patient was maintained on a cardiac monitor.  I personally viewed and interpreted the cardiac monitored which showed an underlying rhythm of: Sinus rhythm      Problem List / ED Course / Critical interventions / Medication management  Patient here for several days altered mental status with hypoglycemia today as well, found to be retaining almost 2 L of urine, no UTI, given D50 for his repeat hypoglycemia, pending recheck.  He also has AKI presumably from his urinary retention though may also be somewhat prerenal as he has been somewhat confused and having decreased p.o. intake for the past couple of days.  Sodium is also slightly low likely hypovolemic from decreased intake for the past couple of days as well, patient given some IV fluids, CT abdomen pelvis is pending to rule out any infectious cause of his hypoglycemia.  Discussed with patient's son who is his caregiver that patient will ultimately be admitted for AKI and hypoglycemia and urinary retention.  Signed out to [employer] I ordered medication including D50  for hypoglycemia  Reevaluation of the patient after these medicines showed that the patient improved I have reviewed the patients home medicines and have made adjustments  as needed   Social Determinants of Health:  Patient lives with family.      Amount and/or Complexity of Data Reviewed Labs: ordered. Radiology: ordered. Decision-making details documented in ED Course.  Risk Prescription drug management.           Final Clinical Impression(s) / ED Diagnoses Final diagnoses:  None    Rx / DC Orders ED Discharge Orders     None         Ma Rings, PA-C 11/02/22 1914    Franne Forts, DO 11/10/22 [phone]

## 2022-11-02 NOTE — ED Provider Triage Note (Signed)
Emergency Medicine Provider Triage Evaluation Note  [name] , a 89 y.o. male  was evaluated in triage.  Pt complains of altered mental status.  Review of Systems  Unable to be obtained due to patient's current mental status.  Physical Exam  BP 133/61 (BP Location: Left Arm)   Pulse (!) 58   Temp 98.3 F (36.8 C) (Oral)   Resp 16   SpO2 98%  Gen:   Somnolent, will respond to voice and follow commands Resp:  Normal effort  MSK:   Global weakness Other:  Nontender abdominal distention  Medical Decision Making  Medically screening exam initiated at 2:50 PM.  Appropriate orders placed.  [name] was informed that the remainder of the evaluation will be completed by another provider, this initial triage assessment does not replace that evaluation, and the importance of remaining in the ED until their evaluation is complete.  Patient presenting from home for altered mental status progressive over the past 4 days.  Recent discharge from rehab facility.  EMS initial CBG was in the 30s.  CBG on arrival was normal.  Patient is somnolent and not talking at this time.  He will slowly respond to voice.  Initial workup was ordered.   Gloris Manchester, MD 11/02/22 [phone]

## 2022-11-03 DIAGNOSIS — R338 Other retention of urine: Secondary | ICD-10-CM

## 2022-11-03 DIAGNOSIS — N179 Acute kidney failure, unspecified: Secondary | ICD-10-CM | POA: Diagnosis not present

## 2022-11-03 DIAGNOSIS — E871 Hypo-osmolality and hyponatremia: Secondary | ICD-10-CM | POA: Insufficient documentation

## 2022-11-03 DIAGNOSIS — Z4889 Encounter for other specified surgical aftercare: Secondary | ICD-10-CM | POA: Diagnosis not present

## 2022-11-03 DIAGNOSIS — E11649 Type 2 diabetes mellitus with hypoglycemia without coma: Secondary | ICD-10-CM | POA: Diagnosis not present

## 2022-11-03 DIAGNOSIS — G9341 Metabolic encephalopathy: Secondary | ICD-10-CM | POA: Diagnosis not present

## 2022-11-03 LAB — CBC WITH DIFFERENTIAL/PLATELET
Abs Immature Granulocytes: 0.02 10*3/uL (ref 0.00–0.07)
Basophils Absolute: 0 10*3/uL (ref 0.0–0.1)
Basophils Relative: 0 %
Eosinophils Absolute: 0 10*3/uL (ref 0.0–0.5)
Eosinophils Relative: 0 %
HCT: 31 % — ABNORMAL LOW (ref 39.0–52.0)
Hemoglobin: 10.2 g/dL — ABNORMAL LOW (ref 13.0–17.0)
Immature Granulocytes: 0 %
Lymphocytes Relative: 14 %
Lymphs Abs: 0.9 10*3/uL (ref 0.7–4.0)
MCH: 32.8 pg (ref 26.0–34.0)
MCHC: 32.9 g/dL (ref 30.0–36.0)
MCV: 99.7 fL (ref 80.0–100.0)
Monocytes Absolute: 0.8 10*3/uL (ref 0.1–1.0)
Monocytes Relative: 14 %
Neutro Abs: 4.5 10*3/uL (ref 1.7–7.7)
Neutrophils Relative %: 72 %
Platelets: 264 10*3/uL (ref 150–400)
RBC: 3.11 MIL/uL — ABNORMAL LOW (ref 4.22–5.81)
RDW: 13.5 % (ref 11.5–15.5)
WBC: 6.2 10*3/uL (ref 4.0–10.5)
nRBC: 0 % (ref 0.0–0.2)

## 2022-11-03 LAB — BASIC METABOLIC PANEL
Anion gap: 9 (ref 5–15)
BUN: 49 mg/dL — ABNORMAL HIGH (ref 8–23)
CO2: 22 mmol/L (ref 22–32)
Calcium: 9 mg/dL (ref 8.9–10.3)
Chloride: 102 mmol/L (ref 98–111)
Creatinine, Ser: 1.98 mg/dL — ABNORMAL HIGH (ref 0.61–1.24)
GFR, Estimated: 32 mL/min — ABNORMAL LOW (ref >60)
Glucose, Bld: 111 mg/dL — ABNORMAL HIGH (ref 70–99)
Potassium: 4.1 mmol/L (ref 3.5–5.1)
Sodium: 133 mmol/L — ABNORMAL LOW (ref 135–145)

## 2022-11-03 LAB — COMPREHENSIVE METABOLIC PANEL
ALT: 12 U/L (ref 0–44)
AST: 26 U/L (ref 15–41)
Albumin: 2.9 g/dL — ABNORMAL LOW (ref 3.5–5.0)
Alkaline Phosphatase: 70 U/L (ref 38–126)
Anion gap: 8 (ref 5–15)
BUN: 46 mg/dL — ABNORMAL HIGH (ref 8–23)
CO2: 23 mmol/L (ref 22–32)
Calcium: 8.8 mg/dL — ABNORMAL LOW (ref 8.9–10.3)
Chloride: 101 mmol/L (ref 98–111)
Creatinine, Ser: 1.97 mg/dL — ABNORMAL HIGH (ref 0.61–1.24)
GFR, Estimated: 32 mL/min — ABNORMAL LOW (ref >60)
Glucose, Bld: 148 mg/dL — ABNORMAL HIGH (ref 70–99)
Potassium: 4 mmol/L (ref 3.5–5.1)
Sodium: 132 mmol/L — ABNORMAL LOW (ref 135–145)
Total Bilirubin: 0.5 mg/dL (ref 0.3–1.2)
Total Protein: 7 g/dL (ref 6.5–8.1)

## 2022-11-03 LAB — HEMOGLOBIN A1C
Hgb A1c MFr Bld: 5.8 % — ABNORMAL HIGH (ref 4.8–5.6)
Mean Plasma Glucose: 119.76 mg/dL

## 2022-11-03 LAB — GLUCOSE, CAPILLARY
Glucose-Capillary: 115 mg/dL — ABNORMAL HIGH (ref 70–99)
Glucose-Capillary: 120 mg/dL — ABNORMAL HIGH (ref 70–99)
Glucose-Capillary: 150 mg/dL — ABNORMAL HIGH (ref 70–99)
Glucose-Capillary: 169 mg/dL — ABNORMAL HIGH (ref 70–99)
Glucose-Capillary: 212 mg/dL — ABNORMAL HIGH (ref 70–99)
Glucose-Capillary: 249 mg/dL — ABNORMAL HIGH (ref 70–99)
Glucose-Capillary: 340 mg/dL — ABNORMAL HIGH (ref 70–99)

## 2022-11-03 LAB — MAGNESIUM: Magnesium: 2.1 mg/dL (ref 1.7–2.4)

## 2022-11-03 LAB — TSH: TSH: 1.598 u[IU]/mL (ref 0.350–4.500)

## 2022-11-03 LAB — PROCALCITONIN: Procalcitonin: <0.1 ng/mL

## 2022-11-03 MED ORDER — CHLORHEXIDINE GLUCONATE CLOTH 2 % EX PADS
6.0000 | MEDICATED_PAD | Freq: Every day | CUTANEOUS | Status: DC
Start: 2022-11-03 — End: 2022-11-07
  Administered 2022-11-03 – 2022-11-07 (×4): 6 via TOPICAL

## 2022-11-03 MED ORDER — TAMSULOSIN HCL 0.4 MG PO CAPS
0.4000 mg | ORAL_CAPSULE | Freq: Every day | ORAL | Status: DC
  Administered 2022-11-03 – 2022-11-06 (×4): 0.4 mg via ORAL
  Filled 2022-11-03 (×4): qty 1

## 2022-11-03 MED ORDER — MIDODRINE HCL 5 MG PO TABS
2.5000 mg | ORAL_TABLET | Freq: Three times a day (TID) | ORAL | Status: DC
  Administered 2022-11-03 – 2022-11-07 (×14): 2.5 mg via ORAL
  Filled 2022-11-03 (×14): qty 1

## 2022-11-03 MED ORDER — DEXTROSE 5 % IV SOLN: INTRAVENOUS | Status: DC

## 2022-11-03 MED ORDER — HEPARIN SODIUM (PORCINE) 5000 UNIT/ML IJ SOLN
5000.0000 [IU] | Freq: Three times a day (TID) | INTRAMUSCULAR | Status: DC
  Administered 2022-11-03 – 2022-11-07 (×11): 5000 [IU] via SUBCUTANEOUS
  Filled 2022-11-03 (×13): qty 1

## 2022-11-03 MED ORDER — OXYCODONE HCL 5 MG PO TABS
5.0000 mg | ORAL_TABLET | ORAL | Status: DC | PRN
  Administered 2022-11-04: 5 mg via ORAL
  Filled 2022-11-03: qty 1

## 2022-11-03 MED ORDER — INSULIN ASPART 100 UNIT/ML IJ SOLN
3.0000 [IU] | Freq: Three times a day (TID) | INTRAMUSCULAR | Status: DC
  Administered 2022-11-03 – 2022-11-06 (×8): 3 [IU] via SUBCUTANEOUS

## 2022-11-03 MED ORDER — ONDANSETRON HCL 4 MG/2ML IJ SOLN: 4.0000 mg | Freq: Four times a day (QID) | INTRAMUSCULAR | Status: DC | PRN

## 2022-11-03 MED ORDER — INSULIN ASPART 100 UNIT/ML IJ SOLN
0.0000 [IU] | Freq: Three times a day (TID) | INTRAMUSCULAR | Status: DC
  Administered 2022-11-03: 7 [IU] via SUBCUTANEOUS
  Administered 2022-11-04: 3 [IU] via SUBCUTANEOUS
  Administered 2022-11-04: 2 [IU] via SUBCUTANEOUS
  Administered 2022-11-04: 1 [IU] via SUBCUTANEOUS
  Administered 2022-11-05 – 2022-11-06 (×4): 2 [IU] via SUBCUTANEOUS
  Administered 2022-11-06: 3 [IU] via SUBCUTANEOUS
  Administered 2022-11-06: 2 [IU] via SUBCUTANEOUS
  Administered 2022-11-07: 3 [IU] via SUBCUTANEOUS
  Administered 2022-11-07: 2 [IU] via SUBCUTANEOUS

## 2022-11-03 MED ORDER — ACETAMINOPHEN 325 MG PO TABS
650.0000 mg | ORAL_TABLET | Freq: Four times a day (QID) | ORAL | Status: DC | PRN
Start: 2022-11-03 — End: 2022-11-05

## 2022-11-03 MED ORDER — ONDANSETRON HCL 4 MG PO TABS: 4.0000 mg | ORAL_TABLET | Freq: Four times a day (QID) | ORAL | Status: DC | PRN

## 2022-11-03 MED ORDER — ACETAMINOPHEN 650 MG RE SUPP: 650.0000 mg | Freq: Four times a day (QID) | RECTAL | Status: DC | PRN

## 2022-11-03 NOTE — Assessment & Plan Note (Signed)
-   Creatinine at baseline 1.3, creatinine today 2.3 - Multifactorial, patient is slightly dehydrated but also had urinary retention - Foley catheter in place - Monitor output - Continue IV fluids - Creatinine already improving to 1.98

## 2022-11-03 NOTE — Progress Notes (Signed)
Mobility Specialist Progress Note:    11/03/22 1315  Mobility  Activity Ambulated with assistance in hallway;Stood at bedside  Level of Assistance Minimal assist, patient does 75% or more  Assistive Device Front wheel walker  Distance Ambulated (ft) 25 ft  Range of Motion/Exercises Active;All extremities  Activity Response Tolerated well  Mobility Referral Yes  $Mobility charge 1 Mobility  Mobility Specialist Start Time (ACUTE ONLY) 1315  Mobility Specialist Stop Time (ACUTE ONLY) 1335  Mobility Specialist Time Calculation (min) (ACUTE ONLY) 20 min   Pt received in bed, son in room. Agreeable to mobility, required MinA to stand and ambulate with RW. Tolerated well, used a gait belt for stability. Returned pt to room, left supine. Alarm on, all needs met.   Lawerance Bach Mobility Specialist Please contact via Special educational needs teacher or  Rehab office at [phone]

## 2022-11-03 NOTE — Progress Notes (Signed)
PROGRESS NOTE   [name]  ZOX:[id number] DOB: Oct 17, 1932 DOA: 11/02/2022 PCP: [name], MD   Chief Complaint  Patient presents with   Hypoglycemia   Altered Mental Status   Level of care: Telemetry  Brief Admission History:  89 y.o. male with medical history significant of diabetes mellitus type 2, previously had hypertension, but is now being treated outpatient for hypotension and all antihypertensives have been discontinued, and more presents to the ED with a chief complaint of altered mental status.  Patient is not able to provide any history.  He is very hard of hearing.  He does wake up and tell you his name.  He knows that were in [place] but could not think of the name of the hospital.  He does not know why he is here.  He is irritable but it is the middle of the night.  It is reported that patient was hallucinating for several days, fatigue, and had malodorous urine.  He was brought into the ER today to evaluate for UTI.  He was found to be hypoglycemic down to the 50s.  EMS started him on D10 and route and he was on D10 at time of admission.  We switched this to D5 so that we could do combination fluid of D5 NS as patient was also slightly hyponatremic.  Patient appears volume down, had an AKI, but also had urinary retention which is likely contributing to the AKI.  He had 2 L of urine output upon Foley insertion.  CT was unrevealing.  UA is not indicative of UTI.  Patient is admitted for AKI hypoglycemia.    Assessment and Plan:  Acute metabolic encephalopathy - likely from severe hypoglycemia and postobstructive renal failure - Alcohol levels less than 10 - MRI brain shows no acute intracranial abnormality - He continues to improve to baseline after foley cath placed;   Hyponatremia - Secondary to acute volume overload from urinary obstruction, improving with diuresis  Acute urinary retention - 2 L of urine output after Foley insertion - CT chest abdomen  pelvis shows cluster of irregular nodular densities noted posteriorly in the right upper lobe consistent with atypical inflammation or Mycobacterium, Irregularity of T12 and L1, Extensive coronary artery calcifications, and the urinary bladder and prostate gland are not well-visualized due to scatter artifact from bilateral hip arthroplasties - Plan to remove foley in AM 10/25 and do a voiding trial, if cannot void  he will discharge home with foley in place, discussed with son, and follow up with urology clinic outpatient  - UA is not indicative of UTI - tamsulosin 0.4 mg daily with supper   AKI (acute kidney injury) - postobstructive - Creatinine at baseline 1.3, creatinine 2.3 on admission - Multifactorial, patient is slightly dehydrated but also had urinary retention - Foley catheter in place - Monitor output - Continue IV fluids - Creatinine already improving to 1.98, recheck in AM   Diabetic hypoglycemia  - Holding home NPH for now - can stop dextrose infusion now  - added SSI coverage (sensitive, renal) -- frequent CBG monitoring ordered   Hypotension - Continue home midodrine  DVT prophylaxis: [place] heparin  Code Status: full  Family Communication: son updated telephone 10/24  Disposition: Status is: Inpatient   Consultants:   Procedures:   Antimicrobials:     Subjective: Pt reports he is feeling cool but no complaints today.    Objective: Vitals:   11/02/22 2300 11/03/22 0000 11/03/22 0040 11/03/22 0523  BP: 117/88 Marland Kitchen)  125/56 129/73 (!) 104/53  Pulse: 70 69 69 (!) 58  Resp: 18 13 18 16   Temp: 98.1 F (36.7 C) 98.3 F (36.8 C) 97.9 F (36.6 C) 97.9 F (36.6 C)  TempSrc:   Oral Oral  SpO2: 97% 97% 98% 96%    Intake/Output Summary (Last 24 hours) at 11/03/2022 1413 Last data filed at 11/03/2022 1359 Gross per 24 hour  Intake 762.37 ml  Output 5900 ml  Net -5137.63 ml   There were no vitals filed for this visit. Examination:  General exam: Appears calm  and comfortable  Respiratory system: Clear to auscultation. Respiratory effort normal. Cardiovascular system: normal S1 & S2 heard. No JVD, murmurs, rubs, gallops or clicks. No pedal edema. Gastrointestinal system: Abdomen is nondistended, soft and nontender. No organomegaly or masses felt. Normal bowel sounds heard. GU: foley cath in place: draining amber clear urine.  Central nervous system: Alert and oriented. No focal neurological deficits. Extremities: Symmetric 5 x 5 power. Skin: No rashes, lesions or ulcers. Psychiatry: Judgement and insight appear normal. Mood & affect appropriate.   Data Reviewed: I have personally reviewed following labs and imaging studies  CBC: Recent Labs  Lab 11/02/22 1456 11/02/22 1507 11/03/22 0428  WBC 6.1  --  6.2  NEUTROABS 4.4  --  4.5  HGB 9.8* 9.9* 10.2*  HCT 31.0* 29.0* 31.0*  MCV 100.6*  --  99.7  PLT 260  --  264    Basic Metabolic Panel: Recent Labs  Lab 11/02/22 1456 11/02/22 1507 11/02/22 2350 11/03/22 0428  NA 127* 132* 133* 132*  K 4.3 4.4 4.1 4.0  CL 98 101 102 101  CO2 20*  --  22 23  GLUCOSE 80 79 111* 148*  BUN 60* 49* 49* 46*  CREATININE 2.31* 2.80* 1.98* 1.97*  CALCIUM 8.9  --  9.0 8.8*  MG 2.5*  --   --  2.1    CBG: Recent Labs  Lab 11/03/22 0038 11/03/22 0210 11/03/22 0533 11/03/22 0723 11/03/22 1126  GLUCAP 115* 120* 169* 150* 212*    No results found for this or any previous visit (from the past 240 hour(s)).   Radiology Studies: MR BRAIN WO CONTRAST  Result Date: 11/02/2022 CLINICAL DATA:  Altered mental status EXAM: MRI HEAD WITHOUT CONTRAST TECHNIQUE: Multiplanar, multiecho pulse sequences of the brain and surrounding structures were obtained without intravenous contrast. COMPARISON:  None Available. FINDINGS: Motion degraded examination. Brain: No acute infarct, mass effect or extra-axial collection. No acute or chronic hemorrhage. There is multifocal hyperintense T2-weighted signal within the  white matter. Generalized volume loss. The midline structures are normal. Vascular: Normal flow voids. Skull and upper cervical spine: Hypertrophic, pannus like appearance at the C1-2 interface causing severe stenosis of the spinal canal. Sinuses/Orbits:No paranasal sinus fluid levels or advanced mucosal thickening. No mastoid or middle ear effusion. Normal orbits. Ocular lens replacements. IMPRESSION: 1. No acute intracranial abnormality. 2. Findings of chronic small vessel ischemia and volume loss. 3. Hypertrophic, pannus like appearance at the C1-2 interface causing severe stenosis of the spinal canal. Electronically Signed   By: Deatra Robinson M.D.   On: 11/02/2022 23:49   CT CHEST ABDOMEN PELVIS WO CONTRAST  Result Date: 11/02/2022 CLINICAL DATA:  Unintended weight loss. EXAM: CT CHEST, ABDOMEN AND PELVIS WITHOUT CONTRAST TECHNIQUE: Multidetector CT imaging of the chest, abdomen and pelvis was performed following the standard protocol without IV contrast. RADIATION DOSE REDUCTION: This exam was performed according to the departmental dose-optimization program which includes automated  exposure control, adjustment of the mA and/or kV according to patient size and/or use of iterative reconstruction technique. COMPARISON:  June 23, 2009. FINDINGS: CT CHEST FINDINGS Cardiovascular: Atherosclerosis of thoracic aorta without aneurysm formation. Normal cardiac size. No pericardial effusion. Extensive coronary artery calcifications are noted. Mediastinum/Nodes: No enlarged mediastinal, hilar, or axillary lymph nodes. Thyroid gland, trachea, and esophagus demonstrate no significant findings. Lungs/Pleura: No pneumothorax or pleural effusion is noted. Minimal bibasilar subsegmental atelectasis or scarring is noted. Cluster of irregular densities are noted posteriorly in right upper lobe most consistent with atypical inflammation or possibly scarring. Musculoskeletal: No chest wall mass or suspicious bone lesions  identified. CT ABDOMEN PELVIS FINDINGS Hepatobiliary: No focal liver abnormality is seen. No gallstones, gallbladder wall thickening, or biliary dilatation. Pancreas: Fatty replacement of the pancreas is noted. Spleen: Normal in size without focal abnormality. Adrenals/Urinary Tract: Adrenal glands and kidneys are unremarkable. Urinary bladder is not well visualized due to scatter artifact arising from bilateral hip arthroplasties. Stomach/Bowel: Stomach is unremarkable. There is no evidence of bowel obstruction or inflammation. Vascular/Lymphatic: Aortic atherosclerosis. No enlarged abdominal or pelvic lymph nodes. Reproductive: Prostate is not well visualized due to scatter artifact arising from bilateral hip arthroplasties. Other: No abdominal wall hernia or abnormality. No abdominopelvic ascites. Musculoskeletal: There is irregularity involving the adjacent endplates of T12-L1 which most likely represents degenerative change, but discitis cannot be excluded. IMPRESSION: Cluster of irregular nodular densities are noted posteriorly in right upper lobe most consistent with atypical inflammation or mycobacterium. Follow-up chest CT in 2-3 weeks is recommended to ensure resolution or stability. Irregularity involving the adjacent endplates of T12-L1 which most likely represents progressive degenerative disc disease, but discitis cannot be excluded. Extensive coronary artery calcifications are noted suggesting coronary artery disease. Fatty replacement of the pancreas. Urinary bladder and prostate gland are not well visualized due to scatter artifact arising from bilateral hip arthroplasties. Aortic Atherosclerosis (ICD10-I70.0). Electronically Signed   By: Lupita Raider M.D.   On: 11/02/2022 20:55   CT HEAD WO CONTRAST  Result Date: 11/02/2022 CLINICAL DATA:  Altered mental status and hypoglycemia EXAM: CT HEAD WITHOUT CONTRAST TECHNIQUE: Contiguous axial images were obtained from the base of the skull through  the vertex without intravenous contrast. RADIATION DOSE REDUCTION: This exam was performed according to the departmental dose-optimization program which includes automated exposure control, adjustment of the mA and/or kV according to patient size and/or use of iterative reconstruction technique. COMPARISON:  07/27/2022 FINDINGS: Brain: No evidence of acute infarction, hemorrhage, hydrocephalus, extra-axial collection or mass lesion/mass effect. Mild atrophic and chronic white matter ischemic changes are noted. Vascular: No hyperdense vessel or unexpected calcification. Skull: Normal. Negative for fracture or focal lesion. Sinuses/Orbits: No acute finding. Other: None. IMPRESSION: Chronic atrophic and ischemic changes without acute abnormality. Electronically Signed   By: Alcide Clever M.D.   On: 11/02/2022 17:45   DG Chest Port 1 View  Result Date: 11/02/2022 CLINICAL DATA:  Altered mental status and weight loss, initial encounter EXAM: PORTABLE CHEST 1 VIEW COMPARISON:  07/27/2022 FINDINGS: Cardiac shadow is within normal limits. Aortic calcifications are noted. The lungs are well aerated bilaterally. No focal infiltrate is seen. Multiple old rib fractures are noted on the left. IMPRESSION: No acute abnormality noted. Electronically Signed   By: Alcide Clever M.D.   On: 11/02/2022 17:43    Scheduled Meds:  Chlorhexidine Gluconate Cloth  6 each Topical Daily   heparin  5,000 Units Subcutaneous Q8H   insulin aspart  0-9 Units Subcutaneous TID  WC   insulin aspart  3 Units Subcutaneous TID WC   midodrine  2.5 mg Oral TID WC   tamsulosin  0.4 mg Oral QPC supper   Continuous Infusions:   LOS: 1 day   Time spent: 49 mins  Jamorion Gomillion Laural Benes, MD How to contact the [facility] Attending or Consulting provider 7A - 7P or covering provider during after hours 7P -7A, for this patient?  Check the care team in [facility] and look for a) attending/consulting TR[facility] provider listed and b) the [facility] team listed Log into  [email or web address] to find provider on call.  Locate the [facility] provider you are looking for under [facility] and page to a number that you can be directly reached. If you still have difficulty reaching the provider, please page the [facility] (Director on Call) for the Hospitalists listed on amion for assistance.  11/03/2022, 2:13 PM

## 2022-11-03 NOTE — Assessment & Plan Note (Signed)
-   Sodium 127 - Hypovolemic hyponatremia - Change fluids from D10-D5 NS - Trend sodium - Sodium has improved adequately for tonight up to 133, changing fluids to D5

## 2022-11-03 NOTE — Progress Notes (Signed)
   11/03/22 1222  TOC Brief Assessment  Insurance and Status Reviewed  Patient has primary care physician Yes  Home environment has been reviewed Home with family  Prior level of function: Needs assistance  Prior/Current Home Services No current home services  Social Determinants of Health Reivew SDOH reviewed no interventions necessary  Readmission risk has been reviewed Yes  Transition of care needs no transition of care needs at this time   Transition of Care Department [facility]) has reviewed patient and no TOC needs have been identified at this time. We will continue to monitor patient advancement through interdisciplinary progression rounds. If new patient transition needs arise, please place a TOC consult.

## 2022-11-03 NOTE — Plan of Care (Signed)

## 2022-11-03 NOTE — Assessment & Plan Note (Signed)
-   CT head shows no acute findings - UA is not indicative of UTI - Chest x-ray has no acute findings - CT chest abdomen pelvis shows possible right upper lobe pneumonia - No leukocytosis, afebrile - Patient is hypoglycemic and this is most likely the cause of his confusion - Keep treating per hypoglycemia assessment and plan - UDS is negative - Alcohol levels less than 10 - MRI brain shows no acute intracranial abnormality - Continue to monitor

## 2022-11-03 NOTE — Assessment & Plan Note (Signed)
-   2 L of urine output upon Foley insertion - CT chest abdomen pelvis shows cluster of irregular nodular densities noted posteriorly in the right upper lobe consistent with atypical inflammation or Mycobacterium, Irregularity of T12 and L1, Extensive coronary artery calcifications, and the urinary bladder and prostate gland are not well-visualized due to scatter artifact from bilateral hip arthroplasties - Patient has no leukocytosis there is been no complaint of cough, does not have any fever, Mycobacterium pneumonia is low on the differential.  Will check a procalcitonin - UA is not indicative of UTI - Continue to monitor

## 2022-11-03 NOTE — Assessment & Plan Note (Signed)
- 

## 2022-11-03 NOTE — Hospital Course (Signed)
89 y.o. male with medical history significant of diabetes mellitus type 2, previously had hypertension, but is now being treated outpatient for hypotension and all antihypertensives have been discontinued, and more presents to the ED with a chief complaint of altered mental status.  Patient is not able to provide any history.  He is very hard of hearing.  He does wake up and tell you his name.  He knows that were in [place] but could not think of the name of the hospital.  He does not know why he is here.  He is irritable but it is the middle of the night.  It is reported that patient was hallucinating for several days, fatigue, and had malodorous urine.  He was brought into the ER today to evaluate for UTI.  He was found to be hypoglycemic down to the 50s.  EMS started him on D10 and route and he was on D10 at time of admission.  We switched this to D5 so that we could do combination fluid of D5 NS as patient was also slightly hyponatremic.  Patient appears volume down, had an AKI, but also had urinary retention which is likely contributing to the AKI.  He had 2 L of urine output upon Foley insertion.  CT was unrevealing.  UA is not indicative of UTI.  Patient is admitted for AKI hypoglycemia.

## 2022-11-03 NOTE — Assessment & Plan Note (Signed)
-   Holding home NPH - Continue D5 NS - Blood glucose every 2 hours x 2 and then every 4 hours after that - Regular diet - Glucose has been stable the last readings being 115 and 120 - Likely related to decreased insulin metabolism in the setting of AKI - Continue to monitor

## 2022-11-04 ENCOUNTER — Inpatient Hospital Stay (HOSPITAL_COMMUNITY): Payer: Medicare Other

## 2022-11-04 DIAGNOSIS — R627 Adult failure to thrive: Secondary | ICD-10-CM | POA: Diagnosis not present

## 2022-11-04 DIAGNOSIS — G9341 Metabolic encephalopathy: Secondary | ICD-10-CM | POA: Diagnosis not present

## 2022-11-04 DIAGNOSIS — E11649 Type 2 diabetes mellitus with hypoglycemia without coma: Secondary | ICD-10-CM | POA: Diagnosis not present

## 2022-11-04 DIAGNOSIS — N179 Acute kidney failure, unspecified: Secondary | ICD-10-CM | POA: Diagnosis not present

## 2022-11-04 DIAGNOSIS — Z66 Do not resuscitate: Secondary | ICD-10-CM | POA: Diagnosis not present

## 2022-11-04 DIAGNOSIS — Z515 Encounter for palliative care: Secondary | ICD-10-CM | POA: Diagnosis not present

## 2022-11-04 DIAGNOSIS — R338 Other retention of urine: Secondary | ICD-10-CM | POA: Diagnosis not present

## 2022-11-04 DIAGNOSIS — Z7189 Other specified counseling: Secondary | ICD-10-CM

## 2022-11-04 LAB — BASIC METABOLIC PANEL
Anion gap: 10 (ref 5–15)
BUN: 37 mg/dL — ABNORMAL HIGH (ref 8–23)
CO2: 24 mmol/L (ref 22–32)
Calcium: 9 mg/dL (ref 8.9–10.3)
Chloride: 99 mmol/L (ref 98–111)
Creatinine, Ser: 1.55 mg/dL — ABNORMAL HIGH (ref 0.61–1.24)
GFR, Estimated: 43 mL/min — ABNORMAL LOW (ref >60)
Glucose, Bld: 134 mg/dL — ABNORMAL HIGH (ref 70–99)
Potassium: 4.2 mmol/L (ref 3.5–5.1)
Sodium: 133 mmol/L — ABNORMAL LOW (ref 135–145)

## 2022-11-04 LAB — GLUCOSE, CAPILLARY
Glucose-Capillary: 145 mg/dL — ABNORMAL HIGH (ref 70–99)
Glucose-Capillary: 149 mg/dL — ABNORMAL HIGH (ref 70–99)
Glucose-Capillary: 183 mg/dL — ABNORMAL HIGH (ref 70–99)
Glucose-Capillary: 250 mg/dL — ABNORMAL HIGH (ref 70–99)

## 2022-11-04 NOTE — TOC Transition Note (Signed)
Transition of Care [facility]) - CM/SW Discharge Note   Patient Details  Name: [name] MRN: [id number] Date of Birth: March 26, 1932  Transition of Care [facility]) CM/SW Contact:  Leitha Bleak, RN Phone Number: 11/04/2022, 10:30 AM   Clinical Narrative:   Patient discharging home, lives with his son. TOC at the bedside to offer HHPT. Patient walks with a walker. He is not interested in any home health services. TOC explained he can also ask PCP to order if needed in the future.    Final next level of care: Home/Self Care Barriers to Discharge: Barriers Resolved  Patient Goals and CMS Choice CMS Medicare.gov Compare Post Acute Care list provided to:: Patient Choice offered to / list presented to : Patient  Discharge Placement      Patient and family notified of of transfer: 11/04/22  Discharge Plan and Services Additional resources added to the After Visit Summary for       Social Determinants of Health (SDOH) Interventions SDOH Screenings   Depression (PHQ2-9): Medium Risk (10/31/2022)  Tobacco Use: Medium Risk (10/31/2022)    Readmission Risk Interventions    11/04/2022   10:30 AM  Readmission Risk Prevention Plan  Post Dischage Appt Not Complete  Medication Screening Complete  Transportation Screening Complete

## 2022-11-04 NOTE — Consult Note (Signed)
Consultation Note Date: 11/04/2022   Patient Name: [name]  DOB: September 30, 1932  MRN: [id number]  Age / Sex: 89 y.o., male  PCP: [name], MD Referring Physician: Cleora Fleet, MD  Reason for Consultation: Establishing goals of care  HPI/Patient Profile: 89 y.o. male  with past medical history of diabetes mellitus type 2, previously hypertension, hip replacements, knee replacement admitted on 11/02/2022 with hallucinations, fatigue, malodorous urine with hypoglycemia found to have urinary retention with AKI.   Clinical Assessment and Goals of Care: Consult received and chart review completed. [name] was sound asleep my first visit attempt and then working with PT. I called and spoke with son, Alinda Money. Alinda Money shares that his father was living at ALF up until ~2.5 weeks ago when he brought him to live with he and his wife. Alinda Money shares that his father does not like living in a facility. Alinda Money shares that his father has had gradual physical health decline. He began to need depends in August. He walks with walker but only very short distances. He has had multiple issues with hospitalizations for dehydration and heart rate issues. I see that [name] had Holter monitor but he cancelled his appointment with cardiologist. Alinda Money expresses that he does not force his father to do anything he does not want too although he does not want to be in the hospital but he had no choice. Alinda Money is concerned that his father seems weaker today and is concerned that he is too weak to return home. He is interested in pursuing SNF rehab if this is an option.   I spoke further with Alinda Money about goals of care. We discussed code status and Alinda Money tells me that his father already has DNR. He knows that his father would not want any invasive measures to prolong life. We discussed goals of care and Alinda Money shares that his father is  used to living alone and being independent and now he even needs help with feeding due to his arthritis. Alinda Money shares that his father has told him many times about being ready to die. I encouraged Alinda Money to consider options for how to care for his father after SNF rehab if this is an option. We discussed the need for long term care vs returning home with Alinda Money. I discussed with Alinda Money that if his father truly doesn't want to pursue further care and if he continues to decline then hospice support may be an option to help care for him at home or at a facility.   I went to discuss further with [name]. He is asleep in recliner following PT evaluation. He opens eyes briefly and looks at me but immediately shuts them and returns to sleep. I did not attempt to awaken him further.   All questions/concerns addressed. Emotional support provided. Discussed with Dr. Laural Benes.   Primary Decision Maker PATIENT    SUMMARY OF RECOMMENDATIONS   - DNR - Hopeful for SNF rehab - Recommend outpatient palliative to follow  Code Status/Advance Care Planning: DNR  Symptom Management:  Per attending.   Prognosis:  Poor prognosis with gradual failure to thrive.   Discharge Planning: Skilled Nursing Facility for rehab with Palliative care service follow-up      Primary Diagnoses: Present on Admission:  Acute metabolic encephalopathy  Diabetic hypoglycemia (HCC)  Hypotension  (Resolved) Postop Hyponatremia   I have reviewed the medical record, interviewed the patient and family, and examined the patient. The following aspects are pertinent.  Past Medical History:  Diagnosis Date   Arthritis    Cataracts, both eyes    Diabetes mellitus without complication (HCC)    takes lisinopril for kidneys   S/P hip replacement, bilateral    S/P knee replacement left 2011, right 2012   Social History   Socioeconomic History   Marital status: Divorced    Spouse name: Not on file   Number of children: 2    Years of education: Not on file   Highest education level: Not on file  Occupational History   Not on file  Tobacco Use   Smoking status: Former    Current packs/day: 0.00    Average packs/day: 1 pack/day for 20.0 years (20.0 ttl pk-yrs)    Types: Cigarettes    Start date: 01/11/1959    Quit date: 01/11/1979    Years since quitting: 43.8   Smokeless tobacco: Former    Quit date: 08/23/2011  Vaping Use   Vaping status: Never Used  Substance and Sexual Activity   Alcohol use: Not Currently    Comment: occasionally    Drug use: No   Sexual activity: Never  Other Topics Concern   Not on file  Social History Narrative   Not on file   Social Determinants of Health   Financial Resource Strain: Not on file  Food Insecurity: Not on file  Transportation Needs: Not on file  Physical Activity: Not on file  Stress: Not on file  Social Connections: Not on file   Family History  Problem Relation Age of Onset   Colon cancer Neg Hx    Scheduled Meds:  Chlorhexidine Gluconate Cloth  6 each Topical Daily   heparin  5,000 Units Subcutaneous Q8H   insulin aspart  0-9 Units Subcutaneous TID WC   insulin aspart  3 Units Subcutaneous TID WC   midodrine  2.5 mg Oral TID WC   tamsulosin  0.4 mg Oral QPC supper   Continuous Infusions: PRN Meds:.acetaminophen **OR** acetaminophen, ondansetron **OR** ondansetron (ZOFRAN) IV, oxyCODONE Allergies  Allergen Reactions   Morphine And Codeine Nausea And Vomiting   Morphine Nausea And Vomiting   Review of Systems  Unable to perform ROS: Other  Constitutional:        Sleeping - would not awaken to visit/discuss with me    Physical Exam Vitals and nursing note reviewed.  Constitutional:      General: He is not in acute distress. Cardiovascular:     Rate and Rhythm: Bradycardia present.  Pulmonary:     Effort: No tachypnea, accessory muscle usage or respiratory distress.  Abdominal:     General: Abdomen is flat.  Neurological:      Comments: Sleeping     Vital Signs: BP (!) 106/54 (BP Location: Right Arm)   Pulse 69   Temp 98.5 F (36.9 C) (Oral)   Resp 16   SpO2 96%  Pain Scale: 0-10   Pain Score: 0-No pain   SpO2: SpO2: 96 % O2 Device:SpO2: 96 % O2 Flow Rate: .   IO: Intake/output  summary:  Intake/Output Summary (Last 24 hours) at 11/04/2022 0919 Last data filed at 11/04/2022 9562 Gross per 24 hour  Intake 480 ml  Output 3550 ml  Net -3070 ml    LBM: Last BM Date : 11/03/22 Baseline Weight:   Most recent weight:       Palliative Assessment/Data:    Time Total: 75 min  Greater than 50%  of this time was spent counseling and coordinating care related to the above assessment and plan.  Signed by: Yong Channel, NP Palliative Medicine Team Pager # [phone] (M-F 8a-5p) Team Phone # [phone] (Nights/Weekends)

## 2022-11-04 NOTE — Progress Notes (Signed)
Pt with PT AT BEDSIDE. PT WAS ABLE TO TAKE STEPS AND TRANSPORT TO THE CHAIR.

## 2022-11-04 NOTE — Evaluation (Signed)
Physical Therapy Evaluation Patient Details Name: [name] MRN: [id number] DOB: [birth year] Today's Date: 11/04/2022  History of Present Illness  [name] is a 89 y.o. male with medical history significant of diabetes mellitus type 2, previously had hypertension, but is now being treated outpatient for hypotension and all antihypertensives have been discontinued, and more presents to the ED with a chief complaint of altered mental status.  Patient is not able to provide any history.  He is very hard of hearing.  He does wake up and tell you his name.  He knows that were in [place] but could not think of the name of the hospital.  He does not know why he is here.  He is irritable but it is the middle of the night.  It is reported that patient was hallucinating for several days, fatigue, and had malodorous urine.  He was brought into the ER today to evaluate for UTI.  He was found to be hypoglycemic down to the 50s.  EMS started him on D10 and route and he was on D10 at time of admission.  We switched this to D5 so that we could do combination fluid of D5 NS as patient was also slightly hyponatremic.  Patient appears volume down, had an AKI, but also had urinary retention which is likely contributing to the AKI.  He had 2 L of urine output upon Foley insertion.  CT was unrevealing.  UA is not indicative of UTI.  Patient is admitted for AKI hypoglycemia.  Review of Systems: As mentioned in the history of present illness. All other systems reviewed and are negative.   Clinical Impression  Patient demonstrates slightly labored movement for sitting up at bedside, required repeated attempts before able to complete sit to stand from bedside due to BLE weakness, able to take a few steps at bedside before having to sit due to fatigue, weakness and buckling of knees.  Patient tolerated sitting up in chair after therapy - nurse aware.  Patient will benefit from continued skilled physical therapy in  hospital and recommended venue below to increase strength, balance, endurance for safe ADLs and gait.        If plan is discharge home, recommend the following: A lot of help with bathing/dressing/bathroom;A lot of help with walking and/or transfers;Help with stairs or ramp for entrance;Assistance with cooking/housework   Can travel by private vehicle   No    Equipment Recommendations None recommended by PT  Recommendations for Other Services       Functional Status Assessment Patient has had a recent decline in their functional status and demonstrates the ability to make significant improvements in function in a reasonable and predictable amount of time.     Precautions / Restrictions Precautions Precautions: Fall Restrictions Weight Bearing Restrictions: No      Mobility  Bed Mobility Overal bed mobility: Needs Assistance Bed Mobility: Supine to Sit     Supine to sit: Min assist     General bed mobility comments: increased time, labored movement    Transfers Overall transfer level: Needs assistance Equipment used: Rolling walker (2 wheels) Transfers: Sit to/from Stand, Bed to chair/wheelchair/BSC Sit to Stand: Min assist, Mod assist   Step pivot transfers: Min assist, Mod assist       General transfer comment: requird verbal/tactile cueing for completing sit to stands due to BLE weakness    Ambulation/Gait Ambulation/Gait assistance: Mod assist Gait Distance (Feet): 10 Feet Assistive device: Rolling walker (2 wheels) Gait  Pattern/deviations: Decreased step length - right, Decreased step length - left, Decreased stride length, Knees buckling Gait velocity: slow     General Gait Details: limited to a few steps forward/backward at bedside before having to sit due to c/o fatigue and generalized weakness  Stairs            Wheelchair Mobility     Tilt Bed    Modified Rankin (Stroke Patients Only)       Balance Overall balance assessment:  Needs assistance Sitting-balance support: Feet supported, No upper extremity supported Sitting balance-Leahy Scale: Fair Sitting balance - Comments: fair/good seated at EOB   Standing balance support: Reliant on assistive device for balance, During functional activity, Bilateral upper extremity supported Standing balance-Leahy Scale: Poor Standing balance comment: fair/poor using RW                             Pertinent Vitals/Pain Pain Assessment Pain Assessment: Faces Faces Pain Scale: Hurts little more Pain Location: right wrist/hand (possibly due to fall) Pain Descriptors / Indicators: Sore, Grimacing, Guarding Pain Intervention(s): Limited activity within patient's tolerance, Monitored during session, Repositioned    Home Living Family/patient expects to be discharged to:: Private residence Living Arrangements: Children Available Help at Discharge: Family;Available 24 hours/day Type of Home: House Home Access: Stairs to enter Entrance Stairs-Rails: Right Entrance Stairs-Number of Steps: 1   Home Layout: One level Home Equipment: Agricultural consultant (2 wheels);Cane - single point      Prior Function Prior Level of Function : Needs assist       Physical Assist : Mobility (physical);ADLs (physical) Mobility (physical): Bed mobility;Transfers;Gait;Stairs   Mobility Comments: Household ambulator using RW ADLs Comments: Assisted by family     Extremity/Trunk Assessment   Upper Extremity Assessment Upper Extremity Assessment: Generalized weakness;Right hand dominant;RUE deficits/detail RUE Deficits / Details: grossly 4-/5 except righ hand/wrist -3/5 RUE: Unable to fully assess due to pain RUE Sensation: WNL RUE Coordination: WNL    Lower Extremity Assessment Lower Extremity Assessment: Generalized weakness    Cervical / Trunk Assessment Cervical / Trunk Assessment: Kyphotic  Communication   Communication Communication: No apparent difficulties   Cognition Arousal: Alert Behavior During Therapy: WFL for tasks assessed/performed Overall Cognitive Status: Within Functional Limits for tasks assessed                                          General Comments      Exercises     Assessment/Plan    PT Assessment Patient needs continued PT services  PT Problem List Decreased strength;Decreased activity tolerance;Decreased balance;Decreased mobility       PT Treatment Interventions DME instruction;Gait training;Stair training;Functional mobility training;Therapeutic activities;Therapeutic exercise;Balance training;Patient/family education    PT Goals (Current goals can be found in the Care Plan section)  Acute Rehab PT Goals Patient Stated Goal: return home after rehab PT Goal Formulation: With patient Time For Goal Achievement: 11/18/22 Potential to Achieve Goals: Good    Frequency Min 3X/week     Co-evaluation               AM-PAC PT "6 Clicks" Mobility  Outcome Measure Help needed turning from your back to your side while in a flat bed without using bedrails?: A Little Help needed moving from lying on your back to sitting on the side of a flat bed  without using bedrails?: A Little Help needed moving to and from a bed to a chair (including a wheelchair)?: A Lot Help needed standing up from a chair using your arms (e.g., wheelchair or bedside chair)?: A Lot Help needed to walk in hospital room?: A Lot Help needed climbing 3-5 steps with a railing? : A Lot 6 Click Score: 14    End of Session   Activity Tolerance: Patient tolerated treatment well;Patient limited by fatigue Patient left: in chair;with call bell/phone within reach Nurse Communication: Mobility status PT Visit Diagnosis: Unsteadiness on feet (R26.81);Other abnormalities of gait and mobility (R26.89);Muscle weakness (generalized) (M62.81)    Time: 8469-6295 PT Time Calculation (min) (ACUTE ONLY): 28 min   Charges:   PT  Evaluation $PT Eval Moderate Complexity: 1 Mod PT Treatments $Therapeutic Activity: 23-37 mins PT General Charges $$ ACUTE PT VISIT: 1 Visit        3:10 PM, 11/04/22 Ocie Bob, MPT Physical Therapist with [facility] [phone] office [phone] mobile phone

## 2022-11-04 NOTE — [place] FL2 (Signed)
[place] MEDICAID FL2 LEVEL OF CARE FORM     IDENTIFICATION  Patient Name: [name] Birthdate: 08-02-1932 Sex: male Admission Date (Current Location): 11/02/2022  [facility] and [place] Number:  [employer] and Address:  [facility],  [address] [place] [address]      Provider Number: [id number]  Attending Physician Name and Address:  Cleora Fleet, MD  Relative Name and Phone Number:  Demonte, Wright)  [phone]    Current Level of Care: Hospital Recommended Level of Care: Skilled Nursing Facility Prior Approval Number:    Date Approved/Denied:   PASRR Number: [id number]  Discharge Plan: SNF    Current Diagnoses: Patient Active Problem List   Diagnosis Date Noted   AKI (acute kidney injury) (HCC) 11/03/2022   Acute urinary retention 11/03/2022   Hyponatremia 11/03/2022   Acute metabolic encephalopathy 11/02/2022   Diabetic hypoglycemia (HCC) 12/14/2021   Hypotension 10/28/2021   Flatulence 11/20/2015   Prepatellar abscess, left knee 07/28/2014   Postop Acute blood loss anemia 11/04/2011   OA (osteoarthritis) of hip 11/02/2011    Orientation RESPIRATION BLADDER Height & Weight     Self, Time, Place  Normal Continent Weight:   Height:     BEHAVIORAL SYMPTOMS/MOOD NEUROLOGICAL BOWEL NUTRITION STATUS      Continent Diet (Carb modified)  AMBULATORY STATUS COMMUNICATION OF NEEDS Skin   Extensive Assist Verbally Normal                       Personal Care Assistance Level of Assistance  Bathing, Feeding, Dressing Bathing Assistance: Limited assistance Feeding assistance: Independent Dressing Assistance: Limited assistance     Functional Limitations Info  Sight, Hearing, Speech Sight Info: Adequate Hearing Info: Impaired Speech Info: Adequate    SPECIAL CARE FACTORS FREQUENCY  PT (By licensed PT), OT (By licensed OT)     PT Frequency: 5 times weekly OT Frequency: 5 times weekly             Contractures Contractures Info: Not present    Additional Factors Info  Code Status, Allergies Code Status Info: DNR-Limited Allergies Info: Morphine and Codeine           Current Medications (11/04/2022):  This is the current hospital active medication list Current Facility-Administered Medications  Medication Dose Route Frequency Provider Last Rate Last Admin   acetaminophen (TYLENOL) tablet 650 mg  650 mg Oral Q6H PRN Zierle-Ghosh, Asia B, DO       Or   acetaminophen (TYLENOL) suppository 650 mg  650 mg Rectal Q6H PRN Zierle-Ghosh, Asia B, DO       Chlorhexidine Gluconate Cloth 2 % PADS 6 each  6 each Topical Daily Johnson, Clanford L, MD   6 each at 11/03/22 0948   heparin injection 5,000 Units  5,000 Units Subcutaneous Q8H Zierle-Ghosh, Asia B, DO   5,000 Units at 11/04/22 0617   insulin aspart (novoLOG) injection 0-9 Units  0-9 Units Subcutaneous TID WC Johnson, Clanford L, MD   2 Units at 11/04/22 1141   insulin aspart (novoLOG) injection 3 Units  3 Units Subcutaneous TID WC Johnson, Clanford L, MD   3 Units at 11/04/22 1141   midodrine (PROAMATINE) tablet 2.5 mg  2.5 mg Oral TID WC Zierle-Ghosh, Asia B, DO   2.5 mg at 11/04/22 1133   ondansetron (ZOFRAN) tablet 4 mg  4 mg Oral Q6H PRN Zierle-Ghosh, Asia B, DO       Or   ondansetron (  ZOFRAN) injection 4 mg  4 mg Intravenous Q6H PRN Zierle-Ghosh, Asia B, DO       oxyCODONE (Oxy IR/ROXICODONE) immediate release tablet 5 mg  5 mg Oral Q4H PRN Zierle-Ghosh, Asia B, DO   5 mg at 11/04/22 1440   tamsulosin (FLOMAX) capsule 0.4 mg  0.4 mg Oral QPC supper Laural Benes, Clanford L, MD   0.4 mg at 11/03/22 1757     Discharge Medications: Please see discharge summary for a list of discharge medications.  Relevant Imaging Results:  Relevant Lab Results:   Additional Information SSN: [id number]  Leitha Bleak, [place]

## 2022-11-04 NOTE — Progress Notes (Signed)
PROGRESS NOTE   [name]  ZOX:[id number] DOB: Jun 04, 1932 DOA: 11/02/2022 PCP: [name], MD   Chief Complaint  Patient presents with   Hypoglycemia   Altered Mental Status   Level of care: Telemetry  Brief Admission History:  89 y.o. male with medical history significant of diabetes mellitus type 2, previously had hypertension, but is now being treated outpatient for hypotension and all antihypertensives have been discontinued, and more presents to the ED with a chief complaint of altered mental status.  Patient is not able to provide any history.  He is very hard of hearing.  He does wake up and tell you his name.  He knows that were in [place] but could not think of the name of the hospital.  He does not know why he is here.  He is irritable but it is the middle of the night.  It is reported that patient was hallucinating for several days, fatigue, and had malodorous urine.  He was brought into the ER today to evaluate for UTI.  He was found to be hypoglycemic down to the 50s.  EMS started him on D10 and route and he was on D10 at time of admission.  We switched this to D5 so that we could do combination fluid of D5 NS as patient was also slightly hyponatremic.  Patient appears volume down, had an AKI, but also had urinary retention which is likely contributing to the AKI.  He had 2 L of urine output upon Foley insertion.  CT was unrevealing.  UA is not indicative of UTI.  Patient is admitted for AKI hypoglycemia.    Assessment and Plan:  Acute metabolic encephalopathy - likely from severe hypoglycemia and postobstructive renal failure - Alcohol levels less than 10 - MRI brain shows no acute intracranial abnormality - He continues to improve to baseline after foley cath placed;   Hyponatremia - Secondary to acute volume overload from urinary obstruction, improving with diuresis  Acute urinary retention - 2 L of urine output after Foley insertion - CT chest abdomen  pelvis shows cluster of irregular nodular densities noted posteriorly in the right upper lobe consistent with atypical inflammation or Mycobacterium, Irregularity of T12 and L1, Extensive coronary artery calcifications, and the urinary bladder and prostate gland are not well-visualized due to scatter artifact from bilateral hip arthroplasties - Plan to remove foley in AM 10/25 and do a voiding trial, if cannot void  he will discharge home with foley in place, discussed with son, and follow up with urology clinic outpatient  - UA is not indicative of UTI - tamsulosin 0.4 mg daily with supper   AKI (acute kidney injury) - postobstructive - Creatinine at baseline 1.3, creatinine 2.3 on admission - Multifactorial, patient is slightly dehydrated but also had urinary retention - Foley catheter removed 10/25, waiting to see if he can void on his own - Monitor output - Continue IV fluids - Creatinine already improving to 1.55, recheck in AM  - replace foley if unable to void and he will need to DC home with foley and follow up with urology outpatient  Diabetic hypoglycemia  - Holding home NPH for now - can stop dextrose infusion now  - added SSI coverage (sensitive, renal) -- frequent CBG monitoring ordered  CBG (last 3)  Recent Labs    11/04/22 0249 11/04/22 0739 11/04/22 1139  GLUCAP 145* 149* 183*    Hypotension - Continue home midodrine  DVT prophylaxis: [place] heparin  Code Status: full  Family Communication: son updated telephone 10/24  Disposition: Status is: Inpatient   Consultants:   Procedures:   Antimicrobials:     Subjective: Has not voided since foley was removed.     Objective: Vitals:   11/03/22 1300 11/03/22 1944 11/04/22 0645 11/04/22 1405  BP: (!) 146/55 (!) 123/52 (!) 106/54 (!) 102/54  Pulse: 68 70 69 74  Resp:  15 16 19   Temp: 98.5 F (36.9 C) 98.3 F (36.8 C) 98.5 F (36.9 C) 98.6 F (37 C)  TempSrc: Oral Oral Oral   SpO2: 97% 98% 96% 100%     Intake/Output Summary (Last 24 hours) at 11/04/2022 1419 Last data filed at 11/04/2022 1610 Gross per 24 hour  Intake 480 ml  Output 1950 ml  Net -1470 ml   There were no vitals filed for this visit. Examination:  General exam: Appears calm and comfortable  Respiratory system: Clear to auscultation. Respiratory effort normal. Cardiovascular system: normal S1 & S2 heard. No JVD, murmurs, rubs, gallops or clicks. No pedal edema. Gastrointestinal system: Abdomen is nondistended, soft and nontender. No organomegaly or masses felt. Normal bowel sounds heard. GU: foley cath in place: draining amber clear urine.  Central nervous system: Alert and oriented. No focal neurological deficits. Extremities: Symmetric 5 x 5 power. Skin: No rashes, lesions or ulcers. Psychiatry: Judgement and insight appear normal. Mood & affect appropriate.   Data Reviewed: I have personally reviewed following labs and imaging studies  CBC: Recent Labs  Lab 11/02/22 1456 11/02/22 1507 11/03/22 0428  WBC 6.1  --  6.2  NEUTROABS 4.4  --  4.5  HGB 9.8* 9.9* 10.2*  HCT 31.0* 29.0* 31.0*  MCV 100.6*  --  99.7  PLT 260  --  264    Basic Metabolic Panel: Recent Labs  Lab 11/02/22 1456 11/02/22 1507 11/02/22 2350 11/03/22 0428 11/04/22 0428  NA 127* 132* 133* 132* 133*  K 4.3 4.4 4.1 4.0 4.2  CL 98 101 102 101 99  CO2 20*  --  22 23 24   GLUCOSE 80 79 111* 148* 134*  BUN 60* 49* 49* 46* 37*  CREATININE 2.31* 2.80* 1.98* 1.97* 1.55*  CALCIUM 8.9  --  9.0 8.8* 9.0  MG 2.5*  --   --  2.1  --     CBG: Recent Labs  Lab 11/03/22 1720 11/03/22 1955 11/04/22 0249 11/04/22 0739 11/04/22 1139  GLUCAP 340* 249* 145* 149* 183*    No results found for this or any previous visit (from the past 240 hour(s)).   Radiology Studies: MR BRAIN WO CONTRAST  Result Date: 11/02/2022 CLINICAL DATA:  Altered mental status EXAM: MRI HEAD WITHOUT CONTRAST TECHNIQUE: Multiplanar, multiecho pulse sequences of  the brain and surrounding structures were obtained without intravenous contrast. COMPARISON:  None Available. FINDINGS: Motion degraded examination. Brain: No acute infarct, mass effect or extra-axial collection. No acute or chronic hemorrhage. There is multifocal hyperintense T2-weighted signal within the white matter. Generalized volume loss. The midline structures are normal. Vascular: Normal flow voids. Skull and upper cervical spine: Hypertrophic, pannus like appearance at the C1-2 interface causing severe stenosis of the spinal canal. Sinuses/Orbits:No paranasal sinus fluid levels or advanced mucosal thickening. No mastoid or middle ear effusion. Normal orbits. Ocular lens replacements. IMPRESSION: 1. No acute intracranial abnormality. 2. Findings of chronic small vessel ischemia and volume loss. 3. Hypertrophic, pannus like appearance at the C1-2 interface causing severe stenosis of the spinal canal. Electronically Signed   By: Chrisandra Netters.D.  On: 11/02/2022 23:49   CT CHEST ABDOMEN PELVIS WO CONTRAST  Result Date: 11/02/2022 CLINICAL DATA:  Unintended weight loss. EXAM: CT CHEST, ABDOMEN AND PELVIS WITHOUT CONTRAST TECHNIQUE: Multidetector CT imaging of the chest, abdomen and pelvis was performed following the standard protocol without IV contrast. RADIATION DOSE REDUCTION: This exam was performed according to the departmental dose-optimization program which includes automated exposure control, adjustment of the mA and/or kV according to patient size and/or use of iterative reconstruction technique. COMPARISON:  June 23, 2009. FINDINGS: CT CHEST FINDINGS Cardiovascular: Atherosclerosis of thoracic aorta without aneurysm formation. Normal cardiac size. No pericardial effusion. Extensive coronary artery calcifications are noted. Mediastinum/Nodes: No enlarged mediastinal, hilar, or axillary lymph nodes. Thyroid gland, trachea, and esophagus demonstrate no significant findings. Lungs/Pleura: No  pneumothorax or pleural effusion is noted. Minimal bibasilar subsegmental atelectasis or scarring is noted. Cluster of irregular densities are noted posteriorly in right upper lobe most consistent with atypical inflammation or possibly scarring. Musculoskeletal: No chest wall mass or suspicious bone lesions identified. CT ABDOMEN PELVIS FINDINGS Hepatobiliary: No focal liver abnormality is seen. No gallstones, gallbladder wall thickening, or biliary dilatation. Pancreas: Fatty replacement of the pancreas is noted. Spleen: Normal in size without focal abnormality. Adrenals/Urinary Tract: Adrenal glands and kidneys are unremarkable. Urinary bladder is not well visualized due to scatter artifact arising from bilateral hip arthroplasties. Stomach/Bowel: Stomach is unremarkable. There is no evidence of bowel obstruction or inflammation. Vascular/Lymphatic: Aortic atherosclerosis. No enlarged abdominal or pelvic lymph nodes. Reproductive: Prostate is not well visualized due to scatter artifact arising from bilateral hip arthroplasties. Other: No abdominal wall hernia or abnormality. No abdominopelvic ascites. Musculoskeletal: There is irregularity involving the adjacent endplates of T12-L1 which most likely represents degenerative change, but discitis cannot be excluded. IMPRESSION: Cluster of irregular nodular densities are noted posteriorly in right upper lobe most consistent with atypical inflammation or mycobacterium. Follow-up chest CT in 2-3 weeks is recommended to ensure resolution or stability. Irregularity involving the adjacent endplates of T12-L1 which most likely represents progressive degenerative disc disease, but discitis cannot be excluded. Extensive coronary artery calcifications are noted suggesting coronary artery disease. Fatty replacement of the pancreas. Urinary bladder and prostate gland are not well visualized due to scatter artifact arising from bilateral hip arthroplasties. Aortic Atherosclerosis  (ICD10-I70.0). Electronically Signed   By: Lupita Raider M.D.   On: 11/02/2022 20:55   CT HEAD WO CONTRAST  Result Date: 11/02/2022 CLINICAL DATA:  Altered mental status and hypoglycemia EXAM: CT HEAD WITHOUT CONTRAST TECHNIQUE: Contiguous axial images were obtained from the base of the skull through the vertex without intravenous contrast. RADIATION DOSE REDUCTION: This exam was performed according to the departmental dose-optimization program which includes automated exposure control, adjustment of the mA and/or kV according to patient size and/or use of iterative reconstruction technique. COMPARISON:  07/27/2022 FINDINGS: Brain: No evidence of acute infarction, hemorrhage, hydrocephalus, extra-axial collection or mass lesion/mass effect. Mild atrophic and chronic white matter ischemic changes are noted. Vascular: No hyperdense vessel or unexpected calcification. Skull: Normal. Negative for fracture or focal lesion. Sinuses/Orbits: No acute finding. Other: None. IMPRESSION: Chronic atrophic and ischemic changes without acute abnormality. Electronically Signed   By: Alcide Clever M.D.   On: 11/02/2022 17:45   DG Chest Port 1 View  Result Date: 11/02/2022 CLINICAL DATA:  Altered mental status and weight loss, initial encounter EXAM: PORTABLE CHEST 1 VIEW COMPARISON:  07/27/2022 FINDINGS: Cardiac shadow is within normal limits. Aortic calcifications are noted. The lungs are well aerated bilaterally.  No focal infiltrate is seen. Multiple old rib fractures are noted on the left. IMPRESSION: No acute abnormality noted. Electronically Signed   By: Alcide Clever M.D.   On: 11/02/2022 17:43    Scheduled Meds:  Chlorhexidine Gluconate Cloth  6 each Topical Daily   heparin  5,000 Units Subcutaneous Q8H   insulin aspart  0-9 Units Subcutaneous TID WC   insulin aspart  3 Units Subcutaneous TID WC   midodrine  2.5 mg Oral TID WC   tamsulosin  0.4 mg Oral QPC supper   Continuous Infusions:   LOS: 2 days    Time spent: 41 mins  Michael Wickey Laural Benes, MD How to contact the [facility] Attending or Consulting provider 7A - 7P or covering provider during after hours 7P -7A, for this patient?  Check the care team in [facility] and look for a) attending/consulting [facility] provider listed and b) the [facility] team listed Log into [email or web address] to find provider on call.  Locate the [facility] provider you are looking for under [facility] and page to a number that you can be directly reached. If you still have difficulty reaching the provider, please page the [facility] (Director on Call) for the Hospitalists listed on amion for assistance.  11/04/2022, 2:19 PM

## 2022-11-04 NOTE — TOC Progression Note (Signed)
Transition of Care [facility]) - Progression Note    Patient Details  Name: [name] MRN: [id number] Date of Birth: Feb 02, 1932  Transition [facility]) CM/SW Contact  Leitha Bleak, RN Phone Number: 11/04/2022, 3:24 PM  Clinical Narrative:   PT is recommending SNF. CM called his son as patient had refused HH earlier today and wants to go home. Tony(son), states he lives with him. If he is non ambulatory he can not take him home. He knows hwill want to come home. He sleeps in a recliner and will need assistance. He will come to see patient to discuss SNF. TOC sending out to see what bed offers we can get to discuss with his son. TOC following.    Expected Discharge Plan: Skilled Nursing Facility Barriers to Discharge: Continued Medical Work up, No SNF bed  Expected Discharge Plan and Services       Living arrangements for the past 2 months: Single Family Home                       Social Determinants of Health (SDOH) Interventions SDOH Screenings   Depression (PHQ2-9): Medium Risk (10/31/2022)  Tobacco Use: Medium Risk (10/31/2022)    Readmission Risk Interventions    11/04/2022   10:30 AM  Readmission Risk Prevention Plan  Post Dischage Appt Not Complete  Medication Screening Complete  Transportation Screening Complete

## 2022-11-04 NOTE — Plan of Care (Signed)
  Problem: Acute Rehab PT Goals(only PT should resolve) Goal: Pt Will Go Supine/Side To Sit Outcome: Progressing Flowsheets (Taken 11/04/2022 1512) Pt will go Supine/Side to Sit:  with modified independence  with supervision Goal: Patient Will Transfer Sit To/From Stand Outcome: Progressing Flowsheets (Taken 11/04/2022 1512) Patient will transfer sit to/from stand:  with minimal assist  with contact guard assist Goal: Pt Will Transfer Bed To Chair/Chair To Bed Outcome: Progressing Flowsheets (Taken 11/04/2022 1512) Pt will Transfer Bed to Chair/Chair to Bed:  with contact guard assist  with min assist Goal: Pt Will Ambulate Outcome: Progressing Flowsheets (Taken 11/04/2022 1512) Pt will Ambulate:  50 feet  with contact guard assist  with minimal assist  with rolling walker   3:12 PM, 11/04/22 Ocie Bob, MPT Physical Therapist with [facility] [phone] office [phone] mobile phone

## 2022-11-04 NOTE — Progress Notes (Signed)
Given in report that patient has not voided today , bladder scan revealed 220 in urinary bladder. Encouraging PO intake for patient ,   11/04/22 1506  Urine Characteristics  Bladder Scan Volume (mL) 220 mL  Mobility Assessment: RN to perform for Med/Surg, Progressive Care, Stepdown, & Geropsych at admission/transfer/every shift/prn  What is the highest level of mobility based on the progressive mobility assessment? Level 5 (Walks with assist in room/hall) - Balance while stepping forward/back and can walk in room with assist - Complete

## 2022-11-05 DIAGNOSIS — I959 Hypotension, unspecified: Secondary | ICD-10-CM

## 2022-11-05 DIAGNOSIS — N179 Acute kidney failure, unspecified: Secondary | ICD-10-CM | POA: Diagnosis not present

## 2022-11-05 DIAGNOSIS — E871 Hypo-osmolality and hyponatremia: Secondary | ICD-10-CM | POA: Diagnosis not present

## 2022-11-05 DIAGNOSIS — G9341 Metabolic encephalopathy: Secondary | ICD-10-CM | POA: Diagnosis not present

## 2022-11-05 DIAGNOSIS — R338 Other retention of urine: Secondary | ICD-10-CM | POA: Diagnosis not present

## 2022-11-05 LAB — BASIC METABOLIC PANEL
Anion gap: 8 (ref 5–15)
BUN: 38 mg/dL — ABNORMAL HIGH (ref 8–23)
CO2: 24 mmol/L (ref 22–32)
Calcium: 8.6 mg/dL — ABNORMAL LOW (ref 8.9–10.3)
Chloride: 97 mmol/L — ABNORMAL LOW (ref 98–111)
Creatinine, Ser: 1.62 mg/dL — ABNORMAL HIGH (ref 0.61–1.24)
GFR, Estimated: 41 mL/min — ABNORMAL LOW (ref >60)
Glucose, Bld: 181 mg/dL — ABNORMAL HIGH (ref 70–99)
Potassium: 4.3 mmol/L (ref 3.5–5.1)
Sodium: 129 mmol/L — ABNORMAL LOW (ref 135–145)

## 2022-11-05 LAB — GLUCOSE, CAPILLARY
Glucose-Capillary: 176 mg/dL — ABNORMAL HIGH (ref 70–99)
Glucose-Capillary: 185 mg/dL — ABNORMAL HIGH (ref 70–99)
Glucose-Capillary: 199 mg/dL — ABNORMAL HIGH (ref 70–99)
Glucose-Capillary: 210 mg/dL — ABNORMAL HIGH (ref 70–99)

## 2022-11-05 MED ORDER — ACETAMINOPHEN 650 MG RE SUPP
650.0000 mg | Freq: Four times a day (QID) | RECTAL | Status: DC
  Filled 2022-11-05: qty 1

## 2022-11-05 MED ORDER — ACETAMINOPHEN 325 MG PO TABS
650.0000 mg | ORAL_TABLET | Freq: Four times a day (QID) | ORAL | Status: DC
  Administered 2022-11-05 – 2022-11-07 (×9): 650 mg via ORAL
  Filled 2022-11-05 (×8): qty 2

## 2022-11-05 NOTE — Progress Notes (Signed)
PROGRESS NOTE   [name]  UJW:[id number] DOB: Nov 25, 1932 DOA: 11/02/2022 PCP: [name], MD   Chief Complaint  Patient presents with   Hypoglycemia   Altered Mental Status   Level of care: Telemetry  Brief Admission History:  89 y.o. male with medical history significant of diabetes mellitus type 2, previously had hypertension, but is now being treated outpatient for hypotension and all antihypertensives have been discontinued, and more presents to the ED with a chief complaint of altered mental status.  Patient is not able to provide any history.  He is very hard of hearing.  He does wake up and tell you his name.  He knows that were in [place] but could not think of the name of the hospital.  He does not know why he is here.  He is irritable but it is the middle of the night.  It is reported that patient was hallucinating for several days, fatigue, and had malodorous urine.  He was brought into the ER today to evaluate for UTI.  He was found to be hypoglycemic down to the 50s.  EMS started him on D10 and route and he was on D10 at time of admission.  We switched this to D5 so that we could do combination fluid of D5 NS as patient was also slightly hyponatremic.  Patient appears volume down, had an AKI, but also had urinary retention which is likely contributing to the AKI.  He had 2 L of urine output upon Foley insertion.  CT was unrevealing.  UA is not indicative of UTI.  Patient is admitted for AKI hypoglycemia.    Assessment and Plan:  Acute metabolic encephalopathy - likely from severe hypoglycemia and postobstructive renal failure - Alcohol levels less than 10 - MRI brain shows no acute intracranial abnormality - He continues to improve to baseline after foley cath placed;   Hyponatremia - Secondary to acute volume overload from urinary obstruction, improving with diuresis  Acute urinary retention - 2 L of urine output after Foley insertion - CT chest abdomen  pelvis shows cluster of irregular nodular densities noted posteriorly in the right upper lobe consistent with atypical inflammation or Mycobacterium, Irregularity of T12 and L1, Extensive coronary artery calcifications, and the urinary bladder and prostate gland are not well-visualized due to scatter artifact from bilateral hip arthroplasties - Plan to remove foley in AM 10/25 and do a voiding trial, if cannot void  he will discharge home with foley in place, discussed with son, and follow up with urology clinic outpatient  - UA is not indicative of UTI - tamsulosin 0.4 mg daily with supper   AKI (acute kidney injury) - postobstructive - Creatinine at baseline 1.3, creatinine 2.3 on admission - Multifactorial, patient is slightly dehydrated but also had urinary retention - Foley catheter removed 10/25, waiting to see if he can void on his own - Monitor output - Continue IV fluids - Creatinine already improving to 1.55, recheck in AM  - replace foley if unable to void and he will need to DC home with foley and follow up with urology outpatient  Diabetic hypoglycemia  - Holding home NPH for now - can stop dextrose infusion now  - added SSI coverage (sensitive, renal) -- frequent CBG monitoring ordered  CBG (last 3)  Recent Labs    11/05/22 0738 11/05/22 1157 11/05/22 1624  GLUCAP 176* 185* 199*    Hypotension - Continue home midodrine  DVT prophylaxis: [place] heparin  Code Status: full  Family Communication: son updated telephone 10/24  Disposition: working on SNF rehab placement    Consultants:   Procedures:   Antimicrobials:     Subjective: No specific complaints.      Objective: Vitals:   11/04/22 2006 11/05/22 0300 11/05/22 0920 11/05/22 1417  BP: (!) 103/54 (!) 116/51 103/68 (!) 105/52  Pulse: (!) 102 70 99 69  Resp: 16 18 13    Temp: 98.8 F (37.1 C) 98.7 F (37.1 C)  98.5 F (36.9 C)  TempSrc: Oral Oral    SpO2: 98% 97% 98% 98%    Intake/Output Summary  (Last 24 hours) at 11/05/2022 1647 Last data filed at 11/05/2022 1300 Gross per 24 hour  Intake 840 ml  Output 500 ml  Net 340 ml   There were no vitals filed for this visit. Examination:  General exam: Appears calm and comfortable  Respiratory system: Clear to auscultation. Respiratory effort normal. Cardiovascular system: normal S1 & S2 heard. No JVD, murmurs, rubs, gallops or clicks. No pedal edema. Gastrointestinal system: Abdomen is nondistended, soft and nontender. No organomegaly or masses felt. Normal bowel sounds heard. GU: foley cath in place: draining amber clear urine.  Central nervous system: Alert and oriented. No focal neurological deficits. Extremities: Symmetric 5 x 5 power. Skin: No rashes, lesions or ulcers. Psychiatry: Judgement and insight appear normal. Mood & affect appropriate.   Data Reviewed: I have personally reviewed following labs and imaging studies  CBC: Recent Labs  Lab 11/02/22 1456 11/02/22 1507 11/03/22 0428  WBC 6.1  --  6.2  NEUTROABS 4.4  --  4.5  HGB 9.8* 9.9* 10.2*  HCT 31.0* 29.0* 31.0*  MCV 100.6*  --  99.7  PLT 260  --  264    Basic Metabolic Panel: Recent Labs  Lab 11/02/22 1456 11/02/22 1507 11/02/22 2350 11/03/22 0428 11/04/22 0428 11/05/22 0413  NA 127* 132* 133* 132* 133* 129*  K 4.3 4.4 4.1 4.0 4.2 4.3  CL 98 101 102 101 99 97*  CO2 20*  --  22 23 24 24   GLUCOSE 80 79 111* 148* 134* 181*  BUN 60* 49* 49* 46* 37* 38*  CREATININE 2.31* 2.80* 1.98* 1.97* 1.55* 1.62*  CALCIUM 8.9  --  9.0 8.8* 9.0 8.6*  MG 2.5*  --   --  2.1  --   --     CBG: Recent Labs  Lab 11/04/22 1139 11/04/22 1651 11/05/22 0738 11/05/22 1157 11/05/22 1624  GLUCAP 183* 250* 176* 185* 199*    No results found for this or any previous visit (from the past 240 hour(s)).   Radiology Studies: DG Hand Complete Right  Result Date: 11/04/2022 CLINICAL DATA:  Hand pain and swelling. EXAM: RIGHT HAND - COMPLETE 3+ VIEW COMPARISON:  None  Available. FINDINGS: Evaluation is limited secondary to flexion of the fingers. There is diffuse hand soft tissue swelling. No radiopaque foreign body identified. Peripheral vascular calcifications are seen. No fracture or dislocation. There are degenerative changes affecting the interphalangeal joints diffusely and first carpometacarpal joint, mild-to-moderate. IMPRESSION: Diffuse hand soft tissue swelling. No acute fracture or dislocation. Electronically Signed   By: Darliss Cheney M.D.   On: 11/04/2022 19:39    Scheduled Meds:  acetaminophen  650 mg Oral Q6H   Or   acetaminophen  650 mg Rectal Q6H   Chlorhexidine Gluconate Cloth  6 each Topical Daily   heparin  5,000 Units Subcutaneous Q8H   insulin aspart  0-9 Units Subcutaneous TID WC   insulin  aspart  3 Units Subcutaneous TID WC   midodrine  2.5 mg Oral TID WC   tamsulosin  0.4 mg Oral QPC supper   Continuous Infusions:   LOS: 3 days   Time spent: 38 mins  Debrah Granderson Laural Benes, MD How to contact the [facility] Attending or Consulting provider 7A - 7P or covering provider during after hours 7P -7A, for this patient?  Check the care team in [facility] and look for a) attending/consulting TRH provider listed and b) the [facility] team listed Log into [email or web address] to find provider on call.  Locate the [facility] provider you are looking for under [facility] and page to a number that you can be directly reached. If you still have difficulty reaching the provider, please page the [facility] (Director on Call) for the Hospitalists listed on amion for assistance.  11/05/2022, 4:47 PM

## 2022-11-05 NOTE — TOC Progression Note (Signed)
Transition of Care [facility]) - Progression Note    Patient Details  Name: CORRELL MCNABB MRN: [id number] Date of Birth: [birth year]  Transition of Care [facility]) CM/SW Contact  Catalina Gravel, LCSW Phone Number: 11/05/2022, 10:52 AM  Clinical Narrative:    CSW reviewed bed offers, pt accepted by [facility]  CSW contacted son Alinda Money and shared the offers. Son indicated pt will not accept any of those locations, he has been at [facility] specifically and did not like the wait time for assistance.  CSW advised that there are a few pending offers, did pt have a specific location of interest, son stated no. CSW stated that we can watch pending offers but by Monday, if pt medically ready and pt refuses or is declined by pending offers-family will need to have a plan for DC, son agreed.  [place] to Dr. Roberto Scales there were bed offers but declined at this point.  TOC to follow.   Expected Discharge Plan: Skilled Nursing Facility Barriers to Discharge: SNF Pending bed offer  Expected Discharge Plan and Services       Living arrangements for the past 2 months: Single Family Home                                       Social Determinants of Health (SDOH) Interventions SDOH Screenings   Depression (PHQ2-9): Medium Risk (10/31/2022)  Tobacco Use: Medium Risk (10/31/2022)    Readmission Risk Interventions    11/04/2022   10:30 AM  Readmission Risk Prevention Plan  Post Dischage Appt Not Complete  Medication Screening Complete  Transportation Screening Complete

## 2022-11-06 DIAGNOSIS — G9341 Metabolic encephalopathy: Secondary | ICD-10-CM | POA: Diagnosis not present

## 2022-11-06 DIAGNOSIS — R338 Other retention of urine: Secondary | ICD-10-CM | POA: Diagnosis not present

## 2022-11-06 DIAGNOSIS — N179 Acute kidney failure, unspecified: Secondary | ICD-10-CM | POA: Diagnosis not present

## 2022-11-06 DIAGNOSIS — E871 Hypo-osmolality and hyponatremia: Secondary | ICD-10-CM | POA: Diagnosis not present

## 2022-11-06 LAB — GLUCOSE, CAPILLARY
Glucose-Capillary: 212 mg/dL — ABNORMAL HIGH (ref 70–99)
Glucose-Capillary: 159 mg/dL — ABNORMAL HIGH (ref 70–99)
Glucose-Capillary: 162 mg/dL — ABNORMAL HIGH (ref 70–99)
Glucose-Capillary: 143 mg/dL — ABNORMAL HIGH (ref 70–99)

## 2022-11-06 MED ORDER — INSULIN ASPART 100 UNIT/ML IJ SOLN
4.0000 [IU] | Freq: Three times a day (TID) | INTRAMUSCULAR | Status: DC
  Administered 2022-11-06 – 2022-11-07 (×4): 4 [IU] via SUBCUTANEOUS

## 2022-11-06 NOTE — Progress Notes (Signed)
Patient had a 5 beat run of vtach per telemetry Dr. Carren Rang made aware, no new orders at this time.

## 2022-11-06 NOTE — Progress Notes (Signed)
PROGRESS NOTE   [name]  UYQ:[id number] DOB: 06-11-1932 DOA: 11/02/2022 PCP: [name], MD   Chief Complaint  Patient presents with   Hypoglycemia   Altered Mental Status   Level of care: Telemetry  Brief Admission History:  89 y.o. male with medical history significant of diabetes mellitus type 2, previously had hypertension, but is now being treated outpatient for hypotension and all antihypertensives have been discontinued, and more presents to the ED with a chief complaint of altered mental status.  Patient is not able to provide any history.  He is very hard of hearing.  He does wake up and tell you his name.  He knows that were in [place] but could not think of the name of the hospital.  He does not know why he is here.  He is irritable but it is the middle of the night.  It is reported that patient was hallucinating for several days, fatigue, and had malodorous urine.  He was brought into the ER today to evaluate for UTI.  He was found to be hypoglycemic down to the 50s.  EMS started him on D10 and route and he was on D10 at time of admission.  We switched this to D5 so that we could do combination fluid of D5 NS as patient was also slightly hyponatremic.  Patient appears volume down, had an AKI, but also had urinary retention which is likely contributing to the AKI.  He had 2 L of urine output upon Foley insertion.  CT was unrevealing.  UA is not indicative of UTI.  Patient is admitted for AKI hypoglycemia.    Assessment and Plan:  Acute metabolic encephalopathy - likely from severe hypoglycemia and postobstructive renal failure - Alcohol levels less than 10 - MRI brain shows no acute intracranial abnormality - He continues to improve to baseline after foley cath placed;   Hyponatremia - Secondary to acute volume overload from urinary obstruction, improved with diuresis  Acute urinary retention - 2 L of urine output after Foley insertion - CT chest abdomen  pelvis shows cluster of irregular nodular densities noted posteriorly in the right upper lobe consistent with atypical inflammation or Mycobacterium, Irregularity of T12 and L1, Extensive coronary artery calcifications, and the urinary bladder and prostate gland are not well-visualized due to scatter artifact from bilateral hip arthroplasties - Removed foley in AM 10/25 and pt failed voiding trial, foley had to be replaced that evening, discussed with son, and follow up with urology clinic outpatient  - UA is not indicative of UTI - tamsulosin 0.4 mg daily with supper   AKI (acute kidney injury) - postobstructive - Creatinine at baseline 1.3, creatinine 2.3 on admission - Multifactorial, patient is slightly dehydrated but also had urinary retention - Foley catheter removed 10/25, waiting to see if he can void on his own - Monitor output - Continue IV fluids - Creatinine already improving to 1.55, recheck in AM  - replace foley if unable to void and he will need to DC home with foley and follow up with urology outpatient  Diabetic hypoglycemia - resolved  - Holding home NPH for now, likely resume at much lower doses when eating better - stopped dextrose infusion   - added SSI coverage (sensitive, renal) -- frequent CBG monitoring ordered  CBG (last 3)  Recent Labs    11/05/22 1624 11/05/22 2218 11/06/22 0739  GLUCAP 199* 210* 212*    Hypotension - Continue home midodrine  DVT prophylaxis: [place] heparin  Code Status: full  Family Communication: son updated telephone 10/24  Disposition: working on SNF rehab placement    Consultants:   Procedures:   Antimicrobials:     Subjective: He verbalizes no complaints.  He understands plan to keep foley in place to follow up with urologist in 1-2 weeks post discharge.  He remains agreeable to SNF rehab placement.      Objective: Vitals:   11/05/22 0920 11/05/22 1417 11/05/22 2212 11/06/22 0501  BP: 103/68 (!) 105/52 (!) 109/52 113/86   Pulse: 99 69 65 65  Resp: 13  20   Temp:  98.5 F (36.9 C) 98.3 F (36.8 C) 98.2 F (36.8 C)  TempSrc:      SpO2: 98% 98% 98%     Intake/Output Summary (Last 24 hours) at 11/06/2022 1152 Last data filed at 11/06/2022 0950 Gross per 24 hour  Intake 240 ml  Output 1800 ml  Net -1560 ml   There were no vitals filed for this visit. Examination:  General exam: Appears calm and comfortable  Respiratory system: Clear to auscultation. Respiratory effort normal. Cardiovascular system: normal S1 & S2 heard. No JVD, murmurs, rubs, gallops or clicks. No pedal edema. Gastrointestinal system: Abdomen is nondistended, soft and nontender. No organomegaly or masses felt. Normal bowel sounds heard. GU: foley cath in place: draining amber clear urine.  Central nervous system: Alert and oriented. No focal neurological deficits. Extremities: Symmetric 5 x 5 power. Skin: No rashes, lesions or ulcers. Psychiatry: Judgement and insight appear normal. Mood & affect appropriate.   Data Reviewed: I have personally reviewed following labs and imaging studies  CBC: Recent Labs  Lab 11/02/22 1456 11/02/22 1507 11/03/22 0428  WBC 6.1  --  6.2  NEUTROABS 4.4  --  4.5  HGB 9.8* 9.9* 10.2*  HCT 31.0* 29.0* 31.0*  MCV 100.6*  --  99.7  PLT 260  --  264    Basic Metabolic Panel: Recent Labs  Lab 11/02/22 1456 11/02/22 1507 11/02/22 2350 11/03/22 0428 11/04/22 0428 11/05/22 0413  NA 127* 132* 133* 132* 133* 129*  K 4.3 4.4 4.1 4.0 4.2 4.3  CL 98 101 102 101 99 97*  CO2 20*  --  22 23 24 24   GLUCOSE 80 79 111* 148* 134* 181*  BUN 60* 49* 49* 46* 37* 38*  CREATININE 2.31* 2.80* 1.98* 1.97* 1.55* 1.62*  CALCIUM 8.9  --  9.0 8.8* 9.0 8.6*  MG 2.5*  --   --  2.1  --   --     CBG: Recent Labs  Lab 11/05/22 0738 11/05/22 1157 11/05/22 1624 11/05/22 2218 11/06/22 0739  GLUCAP 176* 185* 199* 210* 212*    No results found for this or any previous visit (from the past 240 hour(s)).    Radiology Studies: DG Hand Complete Right  Result Date: 11/04/2022 CLINICAL DATA:  Hand pain and swelling. EXAM: RIGHT HAND - COMPLETE 3+ VIEW COMPARISON:  None Available. FINDINGS: Evaluation is limited secondary to flexion of the fingers. There is diffuse hand soft tissue swelling. No radiopaque foreign body identified. Peripheral vascular calcifications are seen. No fracture or dislocation. There are degenerative changes affecting the interphalangeal joints diffusely and first carpometacarpal joint, mild-to-moderate. IMPRESSION: Diffuse hand soft tissue swelling. No acute fracture or dislocation. Electronically Signed   By: Darliss Cheney M.D.   On: 11/04/2022 19:39    Scheduled Meds:  acetaminophen  650 mg Oral Q6H   Or   acetaminophen  650 mg Rectal Q6H  Chlorhexidine Gluconate Cloth  6 each Topical Daily   heparin  5,000 Units Subcutaneous Q8H   insulin aspart  0-9 Units Subcutaneous TID WC   insulin aspart  3 Units Subcutaneous TID WC   midodrine  2.5 mg Oral TID WC   tamsulosin  0.4 mg Oral QPC supper   Continuous Infusions:   LOS: 4 days   Time spent: 35 mins  Shi Grose Laural Benes, MD How to contact the [facility] Attending or Consulting provider 7A - 7P or covering provider during after hours 7P -7A, for this patient?  Check the care team in [facility] and look for a) attending/consulting TRH provider listed and b) the [facility] team listed Log into [email or web address] to find provider on call.  Locate the [facility] provider you are looking for under [facility] and page to a number that you can be directly reached. If you still have difficulty reaching the provider, please page the [facility] (Director on Call) for the Hospitalists listed on amion for assistance.  11/06/2022, 11:52 AM

## 2022-11-06 NOTE — Plan of Care (Signed)

## 2022-11-07 ENCOUNTER — Telehealth: Payer: Self-pay | Admitting: Family Medicine

## 2022-11-07 DIAGNOSIS — R338 Other retention of urine: Secondary | ICD-10-CM | POA: Diagnosis not present

## 2022-11-07 DIAGNOSIS — G9341 Metabolic encephalopathy: Secondary | ICD-10-CM | POA: Diagnosis not present

## 2022-11-07 DIAGNOSIS — N179 Acute kidney failure, unspecified: Secondary | ICD-10-CM | POA: Diagnosis not present

## 2022-11-07 DIAGNOSIS — R2681 Unsteadiness on feet: Secondary | ICD-10-CM

## 2022-11-07 DIAGNOSIS — E11649 Type 2 diabetes mellitus with hypoglycemia without coma: Secondary | ICD-10-CM | POA: Diagnosis not present

## 2022-11-07 LAB — GLUCOSE, CAPILLARY
Glucose-Capillary: 159 mg/dL — ABNORMAL HIGH (ref 70–99)
Glucose-Capillary: 201 mg/dL — ABNORMAL HIGH (ref 70–99)
Glucose-Capillary: 178 mg/dL — ABNORMAL HIGH (ref 70–99)

## 2022-11-07 MED ORDER — INSULIN NPH ISOPHANE & REGULAR (70-30) 100 UNIT/ML ~~LOC~~ SUSP: 10.0000 [IU] | Freq: Every day | SUBCUTANEOUS | Status: DC

## 2022-11-07 MED ORDER — TAMSULOSIN HCL 0.4 MG PO CAPS: 0.4000 mg | ORAL_CAPSULE | Freq: Every day | ORAL | 1 refills | Status: DC

## 2022-11-07 NOTE — Plan of Care (Signed)

## 2022-11-07 NOTE — Progress Notes (Signed)
Mobility Specialist Progress Note:    11/07/22 0920  Mobility  Activity Ambulated with assistance in room;Transferred from bed to chair  Level of Assistance Minimal assist, patient does 75% or more  Assistive Device Front wheel walker  Distance Ambulated (ft) 15 ft  Range of Motion/Exercises Active;All extremities  Activity Response Tolerated well  Mobility Referral Yes  $Mobility charge 1 Mobility  Mobility Specialist Start Time (ACUTE ONLY) 0920  Mobility Specialist Stop Time (ACUTE ONLY) 0935  Mobility Specialist Time Calculation (min) (ACUTE ONLY) 15 min   Pt received in bed, agreeable to mobility. Required MinA to stand and CGA to ambulate with RW. Tolerated well, pt weak and fatigued at EOS. Left pt in chair, alarm on. Call bell in reach, all needs met.   Lawerance Bach Mobility Specialist Please contact via Special educational needs teacher or  Rehab office at [phone]

## 2022-11-07 NOTE — Care Management Important Message (Signed)
Important Message  Patient Details  Name: [name] MRN: [id number] Date of Birth: [birth year]   Important Message Given:        [name] 11/07/2022, 11:37 AM

## 2022-11-07 NOTE — Discharge Instructions (Signed)
IMPORTANT INFORMATION: PAY CLOSE ATTENTION  ? ?PHYSICIAN DISCHARGE INSTRUCTIONS ? ?Follow with Primary care provider  Stacks, Warren, MD  and other consultants as instructed by your Hospitalist Physician ? ?SEEK MEDICAL CARE OR RETURN TO EMERGENCY ROOM IF SYMPTOMS COME BACK, WORSEN OR NEW PROBLEM DEVELOPS  ? ?Please note: ?You were cared for by a hospitalist during your hospital stay. Every effort will be made to forward records to your primary care provider.  You can request that your primary care provider send for your hospital records if they have not received them.  Once you are discharged, your primary care physician will handle any further medical issues. Please note that NO REFILLS for any discharge medications will be authorized once you are discharged, as it is imperative that you return to your primary care physician (or establish a relationship with a primary care physician if you do not have one) for your post hospital discharge needs so that they can reassess your need for medications and monitor your lab values. ? ?Please get a complete blood count and chemistry panel checked by your Primary MD at your next visit, and again as instructed by your Primary MD. ? ?Get Medicines reviewed and adjusted: ?Please take all your medications with you for your next visit with your Primary MD ? ?Laboratory/radiological data: ?Please request your Primary MD to go over all hospital tests and procedure/radiological results at the follow up, please ask your primary care provider to get all Hospital records sent to his/her office. ? ?In some cases, they will be blood work, cultures and biopsy results pending at the time of your discharge. Please request that your primary care provider follow up on these results. ? ?If you are diabetic, please bring your blood sugar readings with you to your follow up appointment with primary care.   ? ?Please call and make your follow up appointments as soon as possible.   ? ?Also Note  the following: ?If you experience worsening of your admission symptoms, develop shortness of breath, life threatening emergency, suicidal or homicidal thoughts you must seek medical attention immediately by calling 911 or calling your MD immediately  if symptoms less severe. ? ?You must read complete instructions/literature along with all the possible adverse reactions/side effects for all the Medicines you take and that have been prescribed to you. Take any new Medicines after you have completely understood and accpet all the possible adverse reactions/side effects.  ? ?Do not drive when taking Pain medications or sleeping medications (Benzodiazepines) ? ?Do not take more than prescribed Pain, Sleep and Anxiety Medications. It is not advisable to combine anxiety,sleep and pain medications without talking with your primary care practitioner ? ?Special Instructions: If you have smoked or chewed Tobacco  in the last 2 yrs please stop smoking, stop any regular Alcohol  and or any Recreational drug use. ? ?Wear Seat belts while driving.  Do not drive if taking any narcotic, mind altering or controlled substances or recreational drugs or alcohol.  ? ? ? ? ? ?

## 2022-11-07 NOTE — TOC Transition Note (Signed)
Transition of Care [facility]) - CM/SW Discharge Note   Patient Details  Name: [name] MRN: [id number] Date of Birth: 05-Sep-1932  Transition of [facility]) CM/SW Contact:  Leitha Bleak, RN Phone Number: 11/07/2022, 1:07 PM   Clinical Narrative:   CM called pending bed offers. No new offers given. Per facility patient is doing 75% of what the require already, it would be a short admission. CM called the son, he does not want the other offers and will take him home with home health. CMS choices discussed, he has not preferences. He is requesting PT/OT/Aide and RN for foley care.  K[facility]ee Keen with [facility] accepted, MD ordered. RN updated to call Alinda Money, son for pick up.    Final next level of care: Home w Home Health Services Barriers to Discharge: Barriers Resolved   Patient Goals and CMS Choice CMS Medicare.gov Compare Post Acute Care list provided to:: Patient Represenative (must comment) Choice offered to / list presented to : Adult Children  Discharge Placement               Patient chooses bed at:  (Home) Patient to be transferred to facility by: son Name of family member notified: Alinda Money - son Patient and family notified of of transfer: 11/07/22  Discharge Plan and Services Additional resources added to the After Visit Summary for        [facility] Arranged: RN, PT, OT, Nurse's Aide HH Agency: [facility] Date [facility] Agency Contacted: 11/07/22 Time HH Agency Contacted: [phone] Representative spoke with at [facility] Agency: Kandee Keen  Social Determinants of Health (SDOH) Interventions SDOH Screenings   Food Insecurity: No Food Insecurity (11/07/2022)  Housing: Low Risk  (11/07/2022)  Transportation Needs: No Transportation Needs (11/07/2022)  Utilities: Not At Risk (11/07/2022)  Depression (PHQ2-9): Medium Risk (10/31/2022)  Tobacco Use: Medium Risk (10/31/2022)    Readmission Risk Interventions    11/07/2022    1:05 PM 11/04/2022   10:30 AM  Readmission Risk Prevention Plan   Post Dischage Appt Not Complete Not Complete  Medication Screening Complete Complete  Transportation Screening Complete Complete

## 2022-11-07 NOTE — Consult Note (Signed)
[facility] Liaison Note  11/07/2022  [name] [birth year] [id number]  Location: screened the patient remotely at [facility] ED.  Insurance:  Medicare   [name] is a 89 y.o. male who is a Primary Care Patient of Stacks, Broadus John, MD ([facility]. The patient was screened for readmission hospitalization with noted low risk score for unplanned readmission risk with 1 IP in 6 months.  The patient was assessed for potential Care Management service needs for post hospital transition for care coordination. Review of patient's electronic medical record reveals patient was admitted with Acute metabolic encephalopathy.  Pt will discharge with HHealth services with [facility]. No anticipated needs for care management services.   Plan: [facility] Liaison will continue to follow progress and disposition to asess for post hospital community care coordination/management needs.  Referral request for community care coordination: referral made to [facility] Care Management/Care Guides.   [facility] Care Management/Population Health does not replace or interfere with any arrangements made by the Inpatient Transition of Care team.   For questions contact:   Elliot Cousin, RN, [facility] Liaison [place]   [facility], Population Health Office Hours MTWF  8:00 am-6:00 pm Direct Dial: [phone] mobile [phone] [Office toll free line] Office Hours are M-F 8:30 - 5 pm [email or web address] [email or web address]

## 2022-11-07 NOTE — Discharge Summary (Signed)
Physician Discharge Summary  [name]:[id number] DOB: Jan 12, 1932 DOA: 11/02/2022  PCP: [name], MD  Admit date: 11/02/2022 Discharge date: 11/07/2022  Admitted From:  Home  Disposition: Home with HH (declined SNF)  Recommendations for Outpatient Follow-up:  Follow up with PCP in 1 weeks Follow up with [facility] [place] in 1 week for foley removal, voiding trial Please obtain BMP in 1-2 weeks  Home Health:  PT, RN, OT, Aide  Discharge Condition: STABLE   CODE STATUS: DNR  DIET: resume previous home diet    Brief Hospitalization Summary: Please see all hospital notes, images, labs for full details of the hospitalization. Admission Provider HPI: 89 y.o. male with medical history significant of diabetes mellitus type 2, previously had hypertension, but is now being treated outpatient for hypotension and all antihypertensives have been discontinued, and more presents to the ED with a chief complaint of altered mental status.  Patient is not able to provide any history.  He is very hard of hearing.  He does wake up and tell you his name.  He knows that were in [place] but could not think of the name of the hospital.  He does not know why he is here.  He is irritable but it is the middle of the night.  It is reported that patient was hallucinating for several days, fatigue, and had malodorous urine.  He was brought into the ER today to evaluate for UTI.  He was found to be hypoglycemic down to the 50s.  EMS started him on D10 and route and he was on D10 at time of admission.  We switched this to D5 so that we could do combination fluid of D5 NS as patient was also slightly hyponatremic.  Patient appears volume down, had an AKI, but also had urinary retention which is likely contributing to the AKI.  He had 2 L of urine output upon Foley insertion.  CT was unrevealing.  UA is not indicative of UTI.  Patient is admitted for AKI hypoglycemia.   Hospital Course by  Problem list   Acute metabolic encephalopathy - likely from severe hypoglycemia and postobstructive renal failure - Alcohol levels less than 10 - MRI brain shows no acute intracranial abnormality - He continues to improve to baseline after foley cath placed;    Hyponatremia  - Secondary to acute volume overload from urinary obstruction, improved with diuresis   Acute urinary retention - 2 L of urine output after Foley insertion - CT chest abdomen pelvis shows cluster of irregular nodular densities noted posteriorly in the right upper lobe consistent with atypical inflammation or Mycobacterium, Irregularity of T12 and L1, Extensive coronary artery calcifications, and the urinary bladder and prostate gland are not well-visualized due to scatter artifact from bilateral hip arthroplasties - Removed foley in AM 10/25 and pt failed voiding trial, foley had to be replaced that evening, discussed with son, and follow up with urology clinic outpatient in 1 week for foley removal and voiding trial  - UA is not indicative of UTI - tamsulosin 0.4 mg daily with supper    AKI (acute kidney injury) - postobstructive - Creatinine at baseline 1.3, creatinine 2.3 on admission - Multifactorial, patient is slightly dehydrated but also had urinary retention - Foley catheter removed 10/25, waiting to see if he can void on his own - Monitor output - Continue IV fluids - Creatinine already improving to 1.55, recheck in AM  - replace foley if unable to void and he  will need to DC home with foley and follow up with urology outpatient   Diabetic hypoglycemia - resolved  - Holding home NPH for now, likely resume at much lower doses when discharged - stopped dextrose infusion   - added SSI coverage (sensitive, renal) -- frequent CBG monitoring ordered  CBG (last 3)  Recent Labs    11/06/22 1944 11/07/22 0731 11/07/22 1116  GLUCAP 143* 201* 178*    Hypotension - Continue home midodrine   Discharge  Diagnoses:  Principal Problem:   Acute metabolic encephalopathy Active Problems:   Hypotension   Diabetic hypoglycemia (HCC)   AKI (acute kidney injury) (HCC)   Acute urinary retention   Hyponatremia   Discharge Instructions: Discharge Instructions     Ambulatory referral to Urology   Complete by: As directed    Hospital follow up acute urinary retention, needs foley removal and voiding trial      Allergies as of 11/07/2022       Reactions   Morphine And Codeine Nausea And Vomiting   Morphine Nausea And Vomiting        Medication List     STOP taking these medications    solifenacin 5 MG tablet Commonly known as: VESIcare       TAKE these medications    acetaminophen 500 MG tablet Commonly known as: TYLENOL Take 500 mg by mouth every 6 (six) hours as needed for mild pain (pain score 1-3).   cyanocobalamin 1000 MCG/ML injection Commonly known as: VITAMIN B12 Inject 1 mL (1,000 mcg total) into the skin every 30 (thirty) days.   dorzolamide-timolol 2-0.5 % ophthalmic solution Commonly known as: COSOPT Place 1 drop into both eyes 2 (two) times daily.   insulin NPH-regular Human (70-30) 100 UNIT/ML injection Inject 10 Units into the skin daily with breakfast. What changed:  how much to take how to take this when to take this additional instructions   midodrine 2.5 MG tablet Commonly known as: PROAMATINE Take 1 tablet (2.5 mg total) by mouth 3 (three) times daily with meals. To raise blood pressure   multivitamin with minerals Tabs tablet Take 1 tablet by mouth daily.   ReliOn Insulin Syringe 31G X 15/64" 0.3 ML Misc Generic drug: Insulin Syringe-Needle U-100 Used to inject insulin BID prn   Sennosides 25 MG Tabs Take 1 tablet by mouth See admin instructions. Every third day take 25 mg by mouth.   sennosides-docusate sodium 8.6-50 MG tablet Commonly known as: SENOKOT-S Take 1 tablet by mouth See admin instructions. Every third day take 25 mg by  mouth.   simethicone 80 MG chewable tablet Commonly known as: MYLICON Chew 80 mg by mouth every 6 (six) hours as needed for flatulence.   tamsulosin 0.4 MG Caps capsule Commonly known as: FLOMAX Take 1 capsule (0.4 mg total) by mouth daily after supper.   VITAMIN D (ERGOCALCIFEROL) PO Take 1 tablet by mouth daily.   ZINC ACETATE PO Take 1 tablet by mouth daily.        Follow-up Information     [facility], [facility] Follow up.   Specialty: Home Health Services Why: RN/PT/OT/aide will call to schedule your first home visit. Contact information: [address] [place] [address] [phone]         [name], MD. Schedule an appointment as soon as possible for a visit in 1 week(s).   Specialty: Family Medicine Why: Hospital Follow Up Contact information: [address] [place] [address] [phone]   UROLOGY [place]. Schedule an appointment as soon as possible for a visit in 1 week(s).   Why: Hospital Follow Up for foley cath removal and voiding trial Contact information: [address] [place] [address] [phone]               Allergies  Allergen Reactions   Morphine And Codeine Nausea And Vomiting   Morphine Nausea And Vomiting   Allergies as of 11/07/2022       Reactions   Morphine And Codeine Nausea And Vomiting   Morphine Nausea And Vomiting        Medication List     STOP taking these medications    solifenacin 5 MG tablet Commonly known as: VESIcare       TAKE these medications    acetaminophen 500 MG tablet Commonly known as: TYLENOL Take 500 mg by mouth every 6 (six) hours as needed for mild pain (pain score 1-3).   cyanocobalamin 1000 MCG/ML injection Commonly known as: VITAMIN B12 Inject 1 mL (1,000 mcg total) into the skin every 30 (thirty) days.   dorzolamide-timolol 2-0.5 % ophthalmic solution Commonly known as: COSOPT Place 1  drop into both eyes 2 (two) times daily.   insulin NPH-regular Human (70-30) 100 UNIT/ML injection Inject 10 Units into the skin daily with breakfast. What changed:  how much to take how to take this when to take this additional instructions   midodrine 2.5 MG tablet Commonly known as: PROAMATINE Take 1 tablet (2.5 mg total) by mouth 3 (three) times daily with meals. To raise blood pressure   multivitamin with minerals Tabs tablet Take 1 tablet by mouth daily.   ReliOn Insulin Syringe 31G X 15/64" 0.3 ML Misc Generic drug: Insulin Syringe-Needle U-100 Used to inject insulin BID prn   Sennosides 25 MG Tabs Take 1 tablet by mouth See admin instructions. Every third day take 25 mg by mouth.   sennosides-docusate sodium 8.6-50 MG tablet Commonly known as: SENOKOT-S Take 1 tablet by mouth See admin instructions. Every third day take 25 mg by mouth.   simethicone 80 MG chewable tablet Commonly known as: MYLICON Chew 80 mg by mouth every 6 (six) hours as needed for flatulence.   tamsulosin 0.4 MG Caps capsule Commonly known as: FLOMAX Take 1 capsule (0.4 mg total) by mouth daily after supper.   VITAMIN D (ERGOCALCIFEROL) PO Take 1 tablet by mouth daily.   ZINC ACETATE PO Take 1 tablet by mouth daily.        Procedures/Studies: DG Hand Complete Right  Result Date: 11/04/2022 CLINICAL DATA:  Hand pain and swelling. EXAM: RIGHT HAND - COMPLETE 3+ VIEW COMPARISON:  None Available. FINDINGS: Evaluation is limited secondary to flexion of the fingers. There is diffuse hand soft tissue swelling. No radiopaque foreign body identified. Peripheral vascular calcifications are seen. No fracture or dislocation. There are degenerative changes affecting the interphalangeal joints diffusely and first carpometacarpal joint, mild-to-moderate. IMPRESSION: Diffuse hand soft tissue swelling. No acute fracture or dislocation. Electronically Signed   By: Darliss Cheney M.D.   On: 11/04/2022 19:39    MR BRAIN WO CONTRAST  Result Date: 11/02/2022 CLINICAL DATA:  Altered mental status EXAM: MRI HEAD WITHOUT CONTRAST TECHNIQUE: Multiplanar, multiecho pulse sequences of the brain and surrounding structures were obtained without intravenous contrast. COMPARISON:  None Available. FINDINGS: Motion degraded examination. Brain: No acute infarct, mass effect or extra-axial collection. No acute or chronic hemorrhage. There is multifocal hyperintense T2-weighted signal within the  white matter. Generalized volume loss. The midline structures are normal. Vascular: Normal flow voids. Skull and upper cervical spine: Hypertrophic, pannus like appearance at the C1-2 interface causing severe stenosis of the spinal canal. Sinuses/Orbits:No paranasal sinus fluid levels or advanced mucosal thickening. No mastoid or middle ear effusion. Normal orbits. Ocular lens replacements. IMPRESSION: 1. No acute intracranial abnormality. 2. Findings of chronic small vessel ischemia and volume loss. 3. Hypertrophic, pannus like appearance at the C1-2 interface causing severe stenosis of the spinal canal. Electronically Signed   By: Deatra Robinson M.D.   On: 11/02/2022 23:49   CT CHEST ABDOMEN PELVIS WO CONTRAST  Result Date: 11/02/2022 CLINICAL DATA:  Unintended weight loss. EXAM: CT CHEST, ABDOMEN AND PELVIS WITHOUT CONTRAST TECHNIQUE: Multidetector CT imaging of the chest, abdomen and pelvis was performed following the standard protocol without IV contrast. RADIATION DOSE REDUCTION: This exam was performed according to the departmental dose-optimization program which includes automated exposure control, adjustment of the mA and/or kV according to patient size and/or use of iterative reconstruction technique. COMPARISON:  June 23, 2009. FINDINGS: CT CHEST FINDINGS Cardiovascular: Atherosclerosis of thoracic aorta without aneurysm formation. Normal cardiac size. No pericardial effusion. Extensive coronary artery calcifications are  noted. Mediastinum/Nodes: No enlarged mediastinal, hilar, or axillary lymph nodes. Thyroid gland, trachea, and esophagus demonstrate no significant findings. Lungs/Pleura: No pneumothorax or pleural effusion is noted. Minimal bibasilar subsegmental atelectasis or scarring is noted. Cluster of irregular densities are noted posteriorly in right upper lobe most consistent with atypical inflammation or possibly scarring. Musculoskeletal: No chest wall mass or suspicious bone lesions identified. CT ABDOMEN PELVIS FINDINGS Hepatobiliary: No focal liver abnormality is seen. No gallstones, gallbladder wall thickening, or biliary dilatation. Pancreas: Fatty replacement of the pancreas is noted. Spleen: Normal in size without focal abnormality. Adrenals/Urinary Tract: Adrenal glands and kidneys are unremarkable. Urinary bladder is not well visualized due to scatter artifact arising from bilateral hip arthroplasties. Stomach/Bowel: Stomach is unremarkable. There is no evidence of bowel obstruction or inflammation. Vascular/Lymphatic: Aortic atherosclerosis. No enlarged abdominal or pelvic lymph nodes. Reproductive: Prostate is not well visualized due to scatter artifact arising from bilateral hip arthroplasties. Other: No abdominal wall hernia or abnormality. No abdominopelvic ascites. Musculoskeletal: There is irregularity involving the adjacent endplates of T12-L1 which most likely represents degenerative change, but discitis cannot be excluded. IMPRESSION: Cluster of irregular nodular densities are noted posteriorly in right upper lobe most consistent with atypical inflammation or mycobacterium. Follow-up chest CT in 2-3 weeks is recommended to ensure resolution or stability. Irregularity involving the adjacent endplates of T12-L1 which most likely represents progressive degenerative disc disease, but discitis cannot be excluded. Extensive coronary artery calcifications are noted suggesting coronary artery disease. Fatty  replacement of the pancreas. Urinary bladder and prostate gland are not well visualized due to scatter artifact arising from bilateral hip arthroplasties. Aortic Atherosclerosis (ICD10-I70.0). Electronically Signed   By: Lupita Raider M.D.   On: 11/02/2022 20:55   CT HEAD WO CONTRAST  Result Date: 11/02/2022 CLINICAL DATA:  Altered mental status and hypoglycemia EXAM: CT HEAD WITHOUT CONTRAST TECHNIQUE: Contiguous axial images were obtained from the base of the skull through the vertex without intravenous contrast. RADIATION DOSE REDUCTION: This exam was performed according to the departmental dose-optimization program which includes automated exposure control, adjustment of the mA and/or kV according to patient size and/or use of iterative reconstruction technique. COMPARISON:  07/27/2022 FINDINGS: Brain: No evidence of acute infarction, hemorrhage, hydrocephalus, extra-axial collection or mass lesion/mass effect. Mild atrophic and chronic  white matter ischemic changes are noted. Vascular: No hyperdense vessel or unexpected calcification. Skull: Normal. Negative for fracture or focal lesion. Sinuses/Orbits: No acute finding. Other: None. IMPRESSION: Chronic atrophic and ischemic changes without acute abnormality. Electronically Signed   By: Alcide Clever M.D.   On: 11/02/2022 17:45   DG Chest Port 1 View  Result Date: 11/02/2022 CLINICAL DATA:  Altered mental status and weight loss, initial encounter EXAM: PORTABLE CHEST 1 VIEW COMPARISON:  07/27/2022 FINDINGS: Cardiac shadow is within normal limits. Aortic calcifications are noted. The lungs are well aerated bilaterally. No focal infiltrate is seen. Multiple old rib fractures are noted on the left. IMPRESSION: No acute abnormality noted. Electronically Signed   By: Alcide Clever M.D.   On: 11/02/2022 17:43     Subjective: Pt without complaints.  He has been eating and drinking well and back to baseline mentation.   Discharge Exam: Vitals:    11/06/22 1928 11/07/22 0327  BP: 113/60 (!) 120/58  Pulse: (!) 59 65  Resp: 18 18  Temp: 98 F (36.7 C) 98.1 F (36.7 C)  SpO2: 97% 96%   Vitals:   11/06/22 0501 11/06/22 1319 11/06/22 1928 11/07/22 0327  BP: 113/86 (!) 98/55 113/60 (!) 120/58  Pulse: 65 65 (!) 59 65  Resp:   18 18  Temp: 98.2 F (36.8 C) 98 F (36.7 C) 98 F (36.7 C) 98.1 F (36.7 C)  TempSrc:    Oral  SpO2:  99% 97% 96%   General exam: Appears calm and comfortable  Respiratory system: Clear to auscultation. Respiratory effort normal. Cardiovascular system: normal S1 & S2 heard. No JVD, murmurs, rubs, gallops or clicks. No pedal edema. Gastrointestinal system: Abdomen is nondistended, soft and nontender. No organomegaly or masses felt. Normal bowel sounds heard. GU: foley cath in place: draining amber clear urine.  Central nervous system: Alert and oriented. No focal neurological deficits. Extremities: Symmetric 5 x 5 power. Skin: No rashes, lesions or ulcers. Psychiatry: Judgement and insight appear normal. Mood & affect appropriate.    The results of significant diagnostics from this hospitalization (including imaging, microbiology, ancillary and laboratory) are listed below for reference.     Microbiology: No results found for this or any previous visit (from the past 240 hour(s)).   Labs: BNP (last 3 results) No results for input(s): "BNP" in the last 8760 hours. Basic Metabolic Panel: Recent Labs  Lab 11/02/22 1456 11/02/22 1507 11/02/22 2350 11/03/22 0428 11/04/22 0428 11/05/22 0413  NA 127* 132* 133* 132* 133* 129*  K 4.3 4.4 4.1 4.0 4.2 4.3  CL 98 101 102 101 99 97*  CO2 20*  --  22 23 24 24   GLUCOSE 80 79 111* 148* 134* 181*  BUN 60* 49* 49* 46* 37* 38*  CREATININE 2.31* 2.80* 1.98* 1.97* 1.55* 1.62*  CALCIUM 8.9  --  9.0 8.8* 9.0 8.6*  MG 2.5*  --   --  2.1  --   --    Liver Function Tests: Recent Labs  Lab 11/02/22 1456 11/03/22 0428  AST 29 26  ALT 14 12  ALKPHOS 70 70   BILITOT 0.5 0.5  PROT 7.6 7.0  ALBUMIN 3.2* 2.9*   No results for input(s): "LIPASE", "AMYLASE" in the last 168 hours. Recent Labs  Lab 11/02/22 1448  AMMONIA 12   CBC: Recent Labs  Lab 11/02/22 1456 11/02/22 1507 11/03/22 0428  WBC 6.1  --  6.2  NEUTROABS 4.4  --  4.5  HGB 9.8* 9.9*  10.2*  HCT 31.0* 29.0* 31.0*  MCV 100.6*  --  99.7  PLT 260  --  264   Cardiac Enzymes: No results for input(s): "CKTOTAL", "CKMB", "CKMBINDEX", "TROPONINI" in the last 168 hours. BNP: Invalid input(s): "POCBNP" CBG: Recent Labs  Lab 11/06/22 1205 11/06/22 1629 11/06/22 1944 11/07/22 0731 11/07/22 1116  GLUCAP 159* 162* 143* 201* 178*   D-Dimer No results for input(s): "DDIMER" in the last 72 hours. Hgb A1c No results for input(s): "HGBA1C" in the last 72 hours. Lipid Profile No results for input(s): "CHOL", "HDL", "LDLCALC", "TRIG", "CHOLHDL", "LDLDIRECT" in the last 72 hours. Thyroid function studies No results for input(s): "TSH", "T4TOTAL", "T3FREE", "THYROIDAB" in the last 72 hours.  Invalid input(s): "FREET3" Anemia work up No results for input(s): "VITAMINB12", "FOLATE", "FERRITIN", "TIBC", "IRON", "RETICCTPCT" in the last 72 hours. Urinalysis    Component Value Date/Time   COLORURINE YELLOW 11/02/2022 1635   APPEARANCEUR CLEAR 11/02/2022 1635   APPEARANCEUR Clear 09/21/2022 1544   LABSPEC 1.010 11/02/2022 1635   PHURINE 5.0 11/02/2022 1635   GLUCOSEU NEGATIVE 11/02/2022 1635   HGBUR NEGATIVE 11/02/2022 1635   BILIRUBINUR NEGATIVE 11/02/2022 1635   BILIRUBINUR Negative 09/21/2022 1544   KETONESUR NEGATIVE 11/02/2022 1635   PROTEINUR NEGATIVE 11/02/2022 1635   UROBILINOGEN 0.2 02/15/2012 1045   NITRITE NEGATIVE 11/02/2022 1635   LEUKOCYTESUR NEGATIVE 11/02/2022 1635   Sepsis Labs Recent Labs  Lab 11/02/22 1456 11/03/22 0428  WBC 6.1 6.2   Microbiology No results found for this or any previous visit (from the past 240 hour(s)).  Time coordinating  discharge: 47 mins  SIGNED:  Standley Dakins, MD  [facility] 11/07/2022, 1:08 PM How to contact the [facility] Attending or Consulting provider 7A - 7P or covering provider during after hours 7P -7A, for this patient?  Check the care team in [facility] and look for a) attending/consulting [facility] provider listed and b) the [facility] team listed Log into [email or web address] and use [place]'s universal password to access. If you do not have the password, please contact the hospital operator. Locate the [facility] provider you are looking for under [facility] and page to a number that you can be directly reached. If you still have difficulty reaching the provider, please page the [facility] (Director on Call) for the Hospitalists listed on amion for assistance.

## 2022-11-08 NOTE — Telephone Encounter (Signed)
Was just discharged from hospital, this was not ordered. HFU not till next Monday.Can we order this and send to [facility]

## 2022-11-10 DIAGNOSIS — H9190 Unspecified hearing loss, unspecified ear: Secondary | ICD-10-CM | POA: Diagnosis not present

## 2022-11-10 DIAGNOSIS — I959 Hypotension, unspecified: Secondary | ICD-10-CM | POA: Diagnosis not present

## 2022-11-10 DIAGNOSIS — Z87891 Personal history of nicotine dependence: Secondary | ICD-10-CM | POA: Diagnosis not present

## 2022-11-10 DIAGNOSIS — Z9181 History of falling: Secondary | ICD-10-CM | POA: Diagnosis not present

## 2022-11-10 DIAGNOSIS — I10 Essential (primary) hypertension: Secondary | ICD-10-CM | POA: Diagnosis not present

## 2022-11-10 DIAGNOSIS — Z96643 Presence of artificial hip joint, bilateral: Secondary | ICD-10-CM | POA: Diagnosis not present

## 2022-11-10 DIAGNOSIS — E860 Dehydration: Secondary | ICD-10-CM | POA: Diagnosis not present

## 2022-11-10 DIAGNOSIS — N179 Acute kidney failure, unspecified: Secondary | ICD-10-CM | POA: Diagnosis not present

## 2022-11-10 DIAGNOSIS — N139 Obstructive and reflux uropathy, unspecified: Secondary | ICD-10-CM | POA: Diagnosis not present

## 2022-11-10 DIAGNOSIS — M1990 Unspecified osteoarthritis, unspecified site: Secondary | ICD-10-CM | POA: Diagnosis not present

## 2022-11-10 DIAGNOSIS — I2510 Atherosclerotic heart disease of native coronary artery without angina pectoris: Secondary | ICD-10-CM | POA: Diagnosis not present

## 2022-11-10 DIAGNOSIS — Z794 Long term (current) use of insulin: Secondary | ICD-10-CM | POA: Diagnosis not present

## 2022-11-10 DIAGNOSIS — M4800 Spinal stenosis, site unspecified: Secondary | ICD-10-CM | POA: Diagnosis not present

## 2022-11-10 DIAGNOSIS — G9341 Metabolic encephalopathy: Secondary | ICD-10-CM | POA: Diagnosis not present

## 2022-11-10 DIAGNOSIS — E878 Other disorders of electrolyte and fluid balance, not elsewhere classified: Secondary | ICD-10-CM | POA: Diagnosis not present

## 2022-11-10 DIAGNOSIS — Z466 Encounter for fitting and adjustment of urinary device: Secondary | ICD-10-CM | POA: Diagnosis not present

## 2022-11-10 DIAGNOSIS — G319 Degenerative disease of nervous system, unspecified: Secondary | ICD-10-CM | POA: Diagnosis not present

## 2022-11-10 DIAGNOSIS — R339 Retention of urine, unspecified: Secondary | ICD-10-CM | POA: Diagnosis not present

## 2022-11-10 DIAGNOSIS — E1136 Type 2 diabetes mellitus with diabetic cataract: Secondary | ICD-10-CM | POA: Diagnosis not present

## 2022-11-11 DIAGNOSIS — G9341 Metabolic encephalopathy: Secondary | ICD-10-CM | POA: Diagnosis not present

## 2022-11-11 DIAGNOSIS — N179 Acute kidney failure, unspecified: Secondary | ICD-10-CM | POA: Diagnosis not present

## 2022-11-11 DIAGNOSIS — N139 Obstructive and reflux uropathy, unspecified: Secondary | ICD-10-CM | POA: Diagnosis not present

## 2022-11-11 DIAGNOSIS — E878 Other disorders of electrolyte and fluid balance, not elsewhere classified: Secondary | ICD-10-CM | POA: Diagnosis not present

## 2022-11-11 DIAGNOSIS — E1136 Type 2 diabetes mellitus with diabetic cataract: Secondary | ICD-10-CM | POA: Diagnosis not present

## 2022-11-11 DIAGNOSIS — R339 Retention of urine, unspecified: Secondary | ICD-10-CM | POA: Diagnosis not present

## 2022-11-14 ENCOUNTER — Inpatient Hospital Stay: Payer: Medicare Other | Admitting: Family

## 2022-11-15 ENCOUNTER — Telehealth: Payer: Self-pay | Admitting: *Deleted

## 2022-11-15 DIAGNOSIS — E1136 Type 2 diabetes mellitus with diabetic cataract: Secondary | ICD-10-CM | POA: Diagnosis not present

## 2022-11-15 DIAGNOSIS — N179 Acute kidney failure, unspecified: Secondary | ICD-10-CM | POA: Diagnosis not present

## 2022-11-15 DIAGNOSIS — N139 Obstructive and reflux uropathy, unspecified: Secondary | ICD-10-CM | POA: Diagnosis not present

## 2022-11-15 DIAGNOSIS — E878 Other disorders of electrolyte and fluid balance, not elsewhere classified: Secondary | ICD-10-CM | POA: Diagnosis not present

## 2022-11-15 DIAGNOSIS — G9341 Metabolic encephalopathy: Secondary | ICD-10-CM | POA: Diagnosis not present

## 2022-11-15 DIAGNOSIS — R339 Retention of urine, unspecified: Secondary | ICD-10-CM | POA: Diagnosis not present

## 2022-11-15 NOTE — Telephone Encounter (Signed)
Go ahead with Midodrine BID until I can see him

## 2022-11-15 NOTE — Telephone Encounter (Signed)
Returned call to [place] at [place], no answer  Spoke with patients son and he is aware of med change

## 2022-11-15 NOTE — Telephone Encounter (Signed)
TC from [place] [facility] w/ [facility] Pt not feeling well today, not too specific, HA, stomach a little upset Wanted to let us know that BP low 100/50 while standing 60/40. Midodrine was given about 830 this morning Pt has appt on Thursday, also wanted to make PCP aware that he has a few sores on L heel, both kneew and daughter states that he his starting to get a pressure ulcer on his sacrum Any advise on the BP please call the Son at [phone]

## 2022-11-16 ENCOUNTER — Telehealth: Payer: Self-pay | Admitting: *Deleted

## 2022-11-16 DIAGNOSIS — N179 Acute kidney failure, unspecified: Secondary | ICD-10-CM | POA: Diagnosis not present

## 2022-11-16 DIAGNOSIS — R339 Retention of urine, unspecified: Secondary | ICD-10-CM | POA: Diagnosis not present

## 2022-11-16 DIAGNOSIS — N139 Obstructive and reflux uropathy, unspecified: Secondary | ICD-10-CM | POA: Diagnosis not present

## 2022-11-16 DIAGNOSIS — E878 Other disorders of electrolyte and fluid balance, not elsewhere classified: Secondary | ICD-10-CM | POA: Diagnosis not present

## 2022-11-16 DIAGNOSIS — E1136 Type 2 diabetes mellitus with diabetic cataract: Secondary | ICD-10-CM | POA: Diagnosis not present

## 2022-11-16 DIAGNOSIS — G9341 Metabolic encephalopathy: Secondary | ICD-10-CM | POA: Diagnosis not present

## 2022-11-16 NOTE — Telephone Encounter (Signed)
See if his home health agency can increase their visits.

## 2022-11-16 NOTE — Telephone Encounter (Unsigned)
Home health nurse given verbal approval.  Per home health nurse:I would recommend a referral to wound care center for the left heel.

## 2022-11-17 ENCOUNTER — Encounter: Payer: Self-pay | Admitting: Family Medicine

## 2022-11-17 ENCOUNTER — Ambulatory Visit (INDEPENDENT_AMBULATORY_CARE_PROVIDER_SITE_OTHER): Payer: Medicare Other | Admitting: Family Medicine

## 2022-11-17 VITALS — BP 98/61 | HR 68 | Temp 98.5°F | Ht 71.0 in | Wt 198.0 lb

## 2022-11-17 DIAGNOSIS — L89151 Pressure ulcer of sacral region, stage 1: Secondary | ICD-10-CM | POA: Diagnosis not present

## 2022-11-17 DIAGNOSIS — E119 Type 2 diabetes mellitus without complications: Secondary | ICD-10-CM | POA: Diagnosis not present

## 2022-11-17 DIAGNOSIS — I959 Hypotension, unspecified: Secondary | ICD-10-CM

## 2022-11-17 DIAGNOSIS — Z794 Long term (current) use of insulin: Secondary | ICD-10-CM

## 2022-11-17 DIAGNOSIS — G9341 Metabolic encephalopathy: Secondary | ICD-10-CM

## 2022-11-17 DIAGNOSIS — E11649 Type 2 diabetes mellitus with hypoglycemia without coma: Secondary | ICD-10-CM | POA: Diagnosis not present

## 2022-11-17 DIAGNOSIS — R9389 Abnormal findings on diagnostic imaging of other specified body structures: Secondary | ICD-10-CM | POA: Diagnosis not present

## 2022-11-17 DIAGNOSIS — L89623 Pressure ulcer of left heel, stage 3: Secondary | ICD-10-CM

## 2022-11-17 DIAGNOSIS — R0989 Other specified symptoms and signs involving the circulatory and respiratory systems: Secondary | ICD-10-CM

## 2022-11-17 DIAGNOSIS — R6889 Other general symptoms and signs: Secondary | ICD-10-CM | POA: Diagnosis not present

## 2022-11-17 NOTE — Progress Notes (Signed)
Subjective:  Patient ID: [name], male    DOB: 1932-12-28, 89 y.o.   MRN: [id number]  Patient Care Team: [name], MD as PCP - General (Family Medicine) Jena Gauss Gerrit Friends, MD as Consulting Physician (Gastroenterology)   Chief Complaint:  No chief complaint on file.  HPI: [name] is a 89 y.o. male presenting on 11/17/2022 for Hospitalization Follow-up (Acute kidney injury/Hypotension - this AM 98/48) Patient was admitted to [facility] 11/02/22 through 11/07/22 for acute metabolic encepalopathy secondary to hypoglycemia and acute kidney injury with urinary retention. Patient was admitted with AMS oriented only to self. Patient was negative for UTI. MRI negative for acute abnormalities. Patient received foley catheter due to urinary retention and has scheduled follow up with urology. Patient was discharged home with [facility] for RN/PT/OT/aide. However, they were told that aide is not available in [facility] Presents today with son, who is primary historian and caregiver.  States that he is still having issues with hypotension since coming home. States that they were instructed to give midodrine only twice daily. They are trying to increase his intake and have him exercise with PT and OT. Denies any new falls.   In addition, would like a referral to wound care for left heel possible pressure injury. States that RN from [facility] has been changing the dressing. In addition states that he has a pressure injury on his sacrum. Son has been cleaning and placing barrier cream on site daily. States that they were instructed to not use a pad on sacrum, but to keep it dry.   Furthermore, patient continues to have hypoglycemic episodes. Reports that since coming home he has had 3-4 episodes of fasting am BG in the low 50s-60s. Other mornings averaged 140-150. States that in [facility] they were using SSI. Son has been dosing 70-30 at home based on BG readings. They check BG 3 times per day.  Reports that if BG in the morning is low, then they will skip 70-30, recheck it at lunch. If it is 275-300, he will dose with 10 units of 70-30. If it is around 150-250, will only dose with 5 units. They will repeat BG in the evenings. Again dose 10 units if 275-300, 5 if 150-250. Reports that patient is not eating much during the day. He does drink water.   They have follow up scheduled with urology on Monday.   Relevant past medical, surgical, family, and social history reviewed and updated as indicated.  Allergies and medications reviewed and updated. Data reviewed: Chart in Epic.   Past Medical History:  Diagnosis Date   Arthritis    Cataracts, both eyes    Diabetes mellitus without complication (HCC)    takes lisinopril for kidneys   S/P hip replacement, bilateral    S/P knee replacement left 2011, right 2012    Past Surgical History:  Procedure Laterality Date   BACK SURGERY  March 23, 2011   lower back    IRRIGATION AND DEBRIDEMENT KNEE Left 07/29/2014   Procedure: IRRIGATION AND DEBRIDEMENT WITH BURSECTOMY OF LEFT  KNEE ;  Surgeon: Ollen Gross, MD;  Location: [facility];  Service: Orthopedics;  Laterality: Left;   JOINT REPLACEMENT     knee X 2    TOTAL HIP ARTHROPLASTY  11/02/2011   Procedure: TOTAL HIP ARTHROPLASTY;  Surgeon: Loanne Drilling, MD;  Location: [facility];  Service: Orthopedics;  Laterality: Left;   TOTAL HIP ARTHROPLASTY Right 02/24/2012   Procedure: TOTAL HIP  ARTHROPLASTY;  Surgeon: Loanne Drilling, MD;  Location: [facility];  Service: Orthopedics;  Laterality: Right;    Social History   Socioeconomic History   Marital status: Divorced    Spouse name: Not on file   Number of children: 2   Years of education: Not on file   Highest education level: Not on file  Occupational History   Not on file  Tobacco Use   Smoking status: Former    Current packs/day: 0.00    Average packs/day: 1 pack/day for 20.0 years (20.0 ttl pk-yrs)    Types: Cigarettes    Start date:  01/11/1959    Quit date: 01/11/1979    Years since quitting: 43.8   Smokeless tobacco: Former    Quit date: 08/23/2011  Vaping Use   Vaping status: Never Used  Substance and Sexual Activity   Alcohol use: Not Currently    Comment: occasionally    Drug use: No   Sexual activity: Never  Other Topics Concern   Not on file  Social History Narrative   Not on file   Social Determinants of Health   Financial Resource Strain: Not on file  Food Insecurity: No Food Insecurity (11/07/2022)   Hunger Vital Sign    Worried About Running Out of Food in the Last Year: Never true    Ran Out of Food in the Last Year: Never true  Transportation Needs: No Transportation Needs (11/07/2022)   PRAPARE - Administrator, Civil Service (Medical): No    Lack of Transportation (Non-Medical): No  Physical Activity: Not on file  Stress: Not on file  Social Connections: Not on file  Intimate Partner Violence: Not At Risk (11/07/2022)   Humiliation, Afraid, Rape, and Kick questionnaire    Fear of Current or Ex-Partner: No    Emotionally Abused: No    Physically Abused: No    Sexually Abused: No    Outpatient Encounter Medications as of 11/17/2022  Medication Sig   acetaminophen (TYLENOL) 500 MG tablet Take 500 mg by mouth every 6 (six) hours as needed for mild pain (pain score 1-3).   cyanocobalamin (VITAMIN B12) 1000 MCG/ML injection Inject 1 mL (1,000 mcg total) into the skin every 30 (thirty) days.   dorzolamide-timolol (COSOPT) 22.3-6.8 MG/ML ophthalmic solution Place 1 drop into both eyes 2 (two) times daily.   insulin NPH-regular Human (70-30) 100 UNIT/ML injection Inject 10 Units into the skin daily with breakfast.   Insulin Syringe-Needle U-100 (RELION INSULIN SYRINGE) 31G X 15/64" 0.3 ML MISC Used to inject insulin BID prn   midodrine (PROAMATINE) 2.5 MG tablet Take 1 tablet (2.5 mg total) by mouth 3 (three) times daily with meals. To raise blood pressure   Multiple Vitamin (MULTIVITAMIN  WITH MINERALS) TABS tablet Take 1 tablet by mouth daily.   Sennosides 25 MG TABS Take 1 tablet by mouth See admin instructions. Every third day take 25 mg by mouth.   sennosides-docusate sodium (SENOKOT-S) 8.6-50 MG tablet Take 1 tablet by mouth See admin instructions. Every third day take 25 mg by mouth.   simethicone (MYLICON) 80 MG chewable tablet Chew 80 mg by mouth every 6 (six) hours as needed for flatulence.   tamsulosin (FLOMAX) 0.4 MG CAPS capsule Take 1 capsule (0.4 mg total) by mouth daily after supper.   VITAMIN D, ERGOCALCIFEROL, PO Take 1 tablet by mouth daily.   Zinc Acetate, Oral, (ZINC ACETATE PO) Take 1 tablet by mouth daily.   No facility-administered encounter medications  on file as of 11/17/2022.    Allergies  Allergen Reactions   Morphine And Codeine Nausea And Vomiting   Morphine Nausea And Vomiting    Review of Systems As per HPI  Objective:  BP 98/61   Pulse 68   Temp 98.5 F (36.9 C)   Ht 5\' 11"  (1.803 m)   Wt 198 lb (89.8 kg)   SpO2 97%   BMI 27.62 kg/m    Wt Readings from Last 3 Encounters:  10/31/22 195 lb 9.6 oz (88.7 kg)  09/21/22 204 lb 6.4 oz (92.7 kg)  08/22/22 205 lb 9.6 oz (93.3 kg)   Physical Exam Constitutional:      General: He is awake. He is not in acute distress.    Appearance: He is well-developed, well-groomed and overweight. He is ill-appearing. He is not toxic-appearing or diaphoretic.     Interventions: He is not intubated. HENT:     Head:     Comments: Hard of hearing  Cardiovascular:     Rate and Rhythm: Normal rate and regular rhythm.     Pulses:          Radial pulses are 2+ on the right side and 2+ on the left side.     Heart sounds: Normal heart sounds.  Pulmonary:     Effort: Pulmonary effort is normal. No tachypnea, bradypnea, accessory muscle usage, prolonged expiration, respiratory distress or retractions. He is not intubated.     Breath sounds: No stridor, decreased air movement or transmitted upper airway  sounds. Examination of the right-lower field reveals rhonchi. Examination of the left-lower field reveals rhonchi. Rhonchi present. No decreased breath sounds, wheezing or rales.  Genitourinary:    Comments: Foley catheter in place  Musculoskeletal:     Right lower leg: No edema.     Left lower leg: No edema.       Feet:     Comments: Generalized weakness, using wheelchair   Feet:     Comments: Large, round stage 3 pressure injury on left heel with eschar tissue present  Lymphadenopathy:     Head:     Right side of head: No submental, submandibular, tonsillar, preauricular or posterior auricular adenopathy.     Left side of head: No submental, submandibular, tonsillar, preauricular or posterior auricular adenopathy.  Skin:    General: Skin is warm.     Capillary Refill: Capillary refill takes less than 2 seconds.          Comments: Erythematous skin breakdown at sacrum   Neurological:     General: No focal deficit present.     Mental Status: He is alert, oriented to person, place, and time and easily aroused. Mental status is at baseline.     Motor: Weakness present.     Gait: Gait abnormal.  Psychiatric:        Attention and Perception: Attention and perception normal.        Mood and Affect: Mood and affect normal.        Speech: Speech normal.        Behavior: Behavior normal. Behavior is cooperative.        Thought Content: Thought content normal.        Cognition and Memory: Cognition and memory normal.    Results for orders placed or performed during the hospital encounter of 11/02/22  CBC WITH DIFFERENTIAL  Result Value Ref Range   WBC 6.1 4.0 - 10.5 K/uL   RBC 3.08 (L) 4.22 - 5.81  MIL/uL   Hemoglobin 9.8 (L) 13.0 - 17.0 g/dL   HCT 60.4 (L) 54.0 - 98.1 %   MCV 100.6 (H) 80.0 - 100.0 fL   MCH 31.8 26.0 - 34.0 pg   MCHC 31.6 30.0 - 36.0 g/dL   RDW 19.1 47.8 - 29.5 %   Platelets 260 150 - 400 K/uL   nRBC 0.0 0.0 - 0.2 %   Neutrophils Relative % 73 %   Neutro Abs  4.4 1.7 - 7.7 K/uL   Lymphocytes Relative 12 %   Lymphs Abs 0.8 0.7 - 4.0 K/uL   Monocytes Relative 14 %   Monocytes Absolute 0.9 0.1 - 1.0 K/uL   Eosinophils Relative 0 %   Eosinophils Absolute 0.0 0.0 - 0.5 K/uL   Basophils Relative 1 %   Basophils Absolute 0.0 0.0 - 0.1 K/uL   Immature Granulocytes 0 %   Abs Immature Granulocytes 0.02 0.00 - 0.07 K/uL  Ammonia  Result Value Ref Range   Ammonia 12 9 - 35 umol/L  Basic metabolic panel  Result Value Ref Range   Sodium 127 (L) 135 - 145 mmol/L   Potassium 4.3 3.5 - 5.1 mmol/L   Chloride 98 98 - 111 mmol/L   CO2 20 (L) 22 - 32 mmol/L   Glucose, Bld 80 70 - 99 mg/dL   BUN 60 (H) 8 - 23 mg/dL   Creatinine, Ser 6.21 (H) 0.61 - 1.24 mg/dL   Calcium 8.9 8.9 - 30.8 mg/dL   GFR, Estimated 27 (L) >60 mL/min   Anion gap 9 5 - 15  Hepatic function panel  Result Value Ref Range   Total Protein 7.6 6.5 - 8.1 g/dL   Albumin 3.2 (L) 3.5 - 5.0 g/dL   AST 29 15 - 41 U/L   ALT 14 0 - 44 U/L   Alkaline Phosphatase 70 38 - 126 U/L   Total Bilirubin 0.5 0.3 - 1.2 mg/dL   Bilirubin, Direct 0.1 0.0 - 0.2 mg/dL   Indirect Bilirubin 0.4 0.3 - 0.9 mg/dL  Magnesium  Result Value Ref Range   Magnesium 2.5 (H) 1.7 - 2.4 mg/dL  Lactic acid, plasma  Result Value Ref Range   Lactic Acid, Venous 0.7 0.5 - 1.9 mmol/L  Blood gas, venous  Result Value Ref Range   pH, Ven 7.37 7.25 - 7.43   pCO2, Ven 40 (L) 44 - 60 mmHg   pO2, Ven 55 (H) 32 - 45 mmHg   Bicarbonate 23.2 20.0 - 28.0 mmol/L   Acid-base deficit 2.2 (H) 0.0 - 2.0 mmol/L   O2 Saturation 91.1 %   Patient temperature 36.6    Collection site BLOOD RIGHT ARM    Drawn by [id number]   TSH  Result Value Ref Range   TSH 2.264 0.350 - 4.500 uIU/mL  Ethanol  Result Value Ref Range   Alcohol, Ethyl (B) <10 <10 mg/dL  Urine rapid drug screen (hosp performed)  Result Value Ref Range   Opiates NONE DETECTED NONE DETECTED   Cocaine NONE DETECTED NONE DETECTED   Benzodiazepines NONE DETECTED NONE  DETECTED   Amphetamines NONE DETECTED NONE DETECTED   Tetrahydrocannabinol NONE DETECTED NONE DETECTED   Barbiturates NONE DETECTED NONE DETECTED  Urinalysis, w/ Reflex to Culture (Infection Suspected) -Urine, Clean Catch  Result Value Ref Range   Specimen Source URINE, CLEAN CATCH    Color, Urine YELLOW YELLOW   APPearance CLEAR CLEAR   Specific Gravity, Urine 1.010 1.005 - 1.030   pH 5.0 5.0 -  8.0   Glucose, UA NEGATIVE NEGATIVE mg/dL   Hgb urine dipstick NEGATIVE NEGATIVE   Bilirubin Urine NEGATIVE NEGATIVE   Ketones, ur NEGATIVE NEGATIVE mg/dL   Protein, ur NEGATIVE NEGATIVE mg/dL   Nitrite NEGATIVE NEGATIVE   Leukocytes,Ua NEGATIVE NEGATIVE   RBC / HPF 0-5 0 - 5 RBC/hpf   WBC, UA 0-5 0 - 5 WBC/hpf   Bacteria, UA NONE SEEN NONE SEEN   Squamous Epithelial / HPF 0-5 0 - 5 /HPF  Basic metabolic panel  Result Value Ref Range   Sodium 133 (L) 135 - 145 mmol/L   Potassium 4.1 3.5 - 5.1 mmol/L   Chloride 102 98 - 111 mmol/L   CO2 22 22 - 32 mmol/L   Glucose, Bld 111 (H) 70 - 99 mg/dL   BUN 49 (H) 8 - 23 mg/dL   Creatinine, Ser 1.61 (H) 0.61 - 1.24 mg/dL   Calcium 9.0 8.9 - 09.6 mg/dL   GFR, Estimated 32 (L) >60 mL/min   Anion gap 9 5 - 15  Comprehensive metabolic panel  Result Value Ref Range   Sodium 132 (L) 135 - 145 mmol/L   Potassium 4.0 3.5 - 5.1 mmol/L   Chloride 101 98 - 111 mmol/L   CO2 23 22 - 32 mmol/L   Glucose, Bld 148 (H) 70 - 99 mg/dL   BUN 46 (H) 8 - 23 mg/dL   Creatinine, Ser 0.45 (H) 0.61 - 1.24 mg/dL   Calcium 8.8 (L) 8.9 - 10.3 mg/dL   Total Protein 7.0 6.5 - 8.1 g/dL   Albumin 2.9 (L) 3.5 - 5.0 g/dL   AST 26 15 - 41 U/L   ALT 12 0 - 44 U/L   Alkaline Phosphatase 70 38 - 126 U/L   Total Bilirubin 0.5 0.3 - 1.2 mg/dL   GFR, Estimated 32 (L) >60 mL/min   Anion gap 8 5 - 15  Magnesium  Result Value Ref Range   Magnesium 2.1 1.7 - 2.4 mg/dL  CBC with Differential/Platelet  Result Value Ref Range   WBC 6.2 4.0 - 10.5 K/uL   RBC 3.11 (L) 4.22 - 5.81  MIL/uL   Hemoglobin 10.2 (L) 13.0 - 17.0 g/dL   HCT 40.9 (L) 81.1 - 91.4 %   MCV 99.7 80.0 - 100.0 fL   MCH 32.8 26.0 - 34.0 pg   MCHC 32.9 30.0 - 36.0 g/dL   RDW 78.2 95.6 - 21.3 %   Platelets 264 150 - 400 K/uL   nRBC 0.0 0.0 - 0.2 %   Neutrophils Relative % 72 %   Neutro Abs 4.5 1.7 - 7.7 K/uL   Lymphocytes Relative 14 %   Lymphs Abs 0.9 0.7 - 4.0 K/uL   Monocytes Relative 14 %   Monocytes Absolute 0.8 0.1 - 1.0 K/uL   Eosinophils Relative 0 %   Eosinophils Absolute 0.0 0.0 - 0.5 K/uL   Basophils Relative 0 %   Basophils Absolute 0.0 0.0 - 0.1 K/uL   Immature Granulocytes 0 %   Abs Immature Granulocytes 0.02 0.00 - 0.07 K/uL  TSH  Result Value Ref Range   TSH 1.598 0.350 - 4.500 uIU/mL  Hemoglobin A1c  Result Value Ref Range   Hgb A1c MFr Bld 5.8 (H) 4.8 - 5.6 %   Mean Plasma Glucose 119.76 mg/dL  Glucose, capillary  Result Value Ref Range   Glucose-Capillary 115 (H) 70 - 99 mg/dL  Glucose, capillary  Result Value Ref Range   Glucose-Capillary 120 (H) 70 -  99 mg/dL  Procalcitonin  Result Value Ref Range   Procalcitonin <0.10 ng/mL  Glucose, capillary  Result Value Ref Range   Glucose-Capillary 169 (H) 70 - 99 mg/dL  Glucose, capillary  Result Value Ref Range   Glucose-Capillary 150 (H) 70 - 99 mg/dL  Glucose, capillary  Result Value Ref Range   Glucose-Capillary 212 (H) 70 - 99 mg/dL  Basic metabolic panel  Result Value Ref Range   Sodium 133 (L) 135 - 145 mmol/L   Potassium 4.2 3.5 - 5.1 mmol/L   Chloride 99 98 - 111 mmol/L   CO2 24 22 - 32 mmol/L   Glucose, Bld 134 (H) 70 - 99 mg/dL   BUN 37 (H) 8 - 23 mg/dL   Creatinine, Ser 1.30 (H) 0.61 - 1.24 mg/dL   Calcium 9.0 8.9 - 86.5 mg/dL   GFR, Estimated 43 (L) >60 mL/min   Anion gap 10 5 - 15  Glucose, capillary  Result Value Ref Range   Glucose-Capillary 340 (H) 70 - 99 mg/dL  Glucose, capillary  Result Value Ref Range   Glucose-Capillary 249 (H) 70 - 99 mg/dL  Glucose, capillary  Result Value Ref  Range   Glucose-Capillary 145 (H) 70 - 99 mg/dL  Glucose, capillary  Result Value Ref Range   Glucose-Capillary 149 (H) 70 - 99 mg/dL  Glucose, capillary  Result Value Ref Range   Glucose-Capillary 183 (H) 70 - 99 mg/dL  Glucose, capillary  Result Value Ref Range   Glucose-Capillary 250 (H) 70 - 99 mg/dL  Basic metabolic panel  Result Value Ref Range   Sodium 129 (L) 135 - 145 mmol/L   Potassium 4.3 3.5 - 5.1 mmol/L   Chloride 97 (L) 98 - 111 mmol/L   CO2 24 22 - 32 mmol/L   Glucose, Bld 181 (H) 70 - 99 mg/dL   BUN 38 (H) 8 - 23 mg/dL   Creatinine, Ser 7.84 (H) 0.61 - 1.24 mg/dL   Calcium 8.6 (L) 8.9 - 10.3 mg/dL   GFR, Estimated 41 (L) >60 mL/min   Anion gap 8 5 - 15  Glucose, capillary  Result Value Ref Range   Glucose-Capillary 176 (H) 70 - 99 mg/dL  Glucose, capillary  Result Value Ref Range   Glucose-Capillary 185 (H) 70 - 99 mg/dL  Glucose, capillary  Result Value Ref Range   Glucose-Capillary 199 (H) 70 - 99 mg/dL  Glucose, capillary  Result Value Ref Range   Glucose-Capillary 210 (H) 70 - 99 mg/dL   Comment 1 Notify RN    Comment 2 Document in Chart   Glucose, capillary  Result Value Ref Range   Glucose-Capillary 212 (H) 70 - 99 mg/dL  Glucose, capillary  Result Value Ref Range   Glucose-Capillary 159 (H) 70 - 99 mg/dL  Glucose, capillary  Result Value Ref Range   Glucose-Capillary 162 (H) 70 - 99 mg/dL  Glucose, capillary  Result Value Ref Range   Glucose-Capillary 143 (H) 70 - 99 mg/dL  Glucose, capillary  Result Value Ref Range   Glucose-Capillary 201 (H) 70 - 99 mg/dL  Glucose, capillary  Result Value Ref Range   Glucose-Capillary 159 (H) 70 - 99 mg/dL  Glucose, capillary  Result Value Ref Range   Glucose-Capillary 178 (H) 70 - 99 mg/dL   Comment 1 Notify RN    Comment 2 Document in Chart   CBG monitoring, ED  Result Value Ref Range   Glucose-Capillary 99 70 - 99 mg/dL  CBG monitoring, ED  Result  Value Ref Range   Glucose-Capillary 55 (L)  70 - 99 mg/dL  I-Stat Chem 8, ED  Result Value Ref Range   Sodium 132 (L) 135 - 145 mmol/L   Potassium 4.4 3.5 - 5.1 mmol/L   Chloride 101 98 - 111 mmol/L   BUN 49 (H) 8 - 23 mg/dL   Creatinine, Ser 1.61 (H) 0.61 - 1.24 mg/dL   Glucose, Bld 79 70 - 99 mg/dL   Calcium, Ion 0.96 1.15 - 1.40 mmol/L   TCO2 18 (L) 22 - 32 mmol/L   Hemoglobin 9.9 (L) 13.0 - 17.0 g/dL   HCT 04.5 (L) 40.9 - 81.1 %  CBG monitoring, ED  Result Value Ref Range   Glucose-Capillary 88 70 - 99 mg/dL  POC CBG, ED  Result Value Ref Range   Glucose-Capillary 75 70 - 99 mg/dL  CBG monitoring, ED  Result Value Ref Range   Glucose-Capillary 115 (H) 70 - 99 mg/dL  CBG monitoring, ED  Result Value Ref Range   Glucose-Capillary 86 70 - 99 mg/dL       91/47/8295   62:13 AM 09/21/2022    3:41 PM 09/21/2022    3:27 PM 08/22/2022    3:08 PM 08/22/2022    2:56 PM  Depression screen PHQ 2/9  Decreased Interest 2 2 0 2 0  Down, Depressed, Hopeless 2 1 0 2 0  PHQ - 2 Score 4 3 0 4 0  Altered sleeping 2 0  1   Tired, decreased energy 0 2  1   Change in appetite 0 0  0   Feeling bad or failure about yourself  1 1  0   Trouble concentrating 1 0  0   Moving slowly or fidgety/restless 1 0  0   Suicidal thoughts 0 0  0   PHQ-9 Score 9 6  6    Difficult doing work/chores Somewhat difficult Very difficult  Somewhat difficult        10/31/2022   11:48 AM 09/21/2022    3:42 PM 08/22/2022    3:09 PM 05/03/2022   10:40 AM  GAD 7 : Generalized Anxiety Score  Nervous, Anxious, on Edge 0 0 0 1  Control/stop worrying 1 0 0 0  Worry too much - different things 1 2 1 1   Trouble relaxing 2 2 1  0  Restless 0 0 0 0  Easily annoyed or irritable 1 3 2 2   Afraid - awful might happen 2 0 0 0  Total GAD 7 Score 7 7 4 4   Anxiety Difficulty Somewhat difficult Somewhat difficult Somewhat difficult Somewhat difficult   Pertinent labs & imaging results that were available during my care of the patient were reviewed by me and considered  in my medical decision making.  Assessment & Plan:  Kentrel was seen today for hospitalization follow-up.  Diagnoses and all orders for this visit:  Acute metabolic encephalopathy Patient oriented to self, time, place, and situation. Receiving at home care from PT/OT and nursing. Has support system with son. Labs as below. Will communicate results to patient once available. Will await results to determine next steps. Patient to follow up with urology for urinary retention and voiding trial.  -     CMP14+EGFR -     CBC with Differential/Platelet -     Magnesium  Diabetic hypoglycemia (HCC) Discussed with patient and son not dosing 70-30 two times per day and encouraged them to administer in the mornings. Discussed with them risk of hypoglycemia  and to permit patient to have slightly elevated BG during the day, especially given A1C of 5.8%. Recommend that patient decrease dose to 5 units daily. Continue to monitor BG 3 times per day.  -     CMP14+EGFR -     CBC with Differential/Platelet -     Magnesium  Diabetes mellitus type 2, insulin dependent (HCC) As above.  -     CMP14+EGFR -     CBC with Differential/Platelet -     Magnesium  Pressure injury of left heel, stage 3 (HCC) Wound dressed with mepilex in office today. Referral placed as below. Provided patient son with additional supplies in office today. Discussed off-loading pressure with pillows and floating heel at home. -     Ambulatory referral to Wound Clinic  Pressure injury of sacral region, stage 1 Referral placed as below. Did not have sacral wound dressing in office today. Discussed frequently changing positions and off-loading pressure from sacrum. Discussed cleaning wound.  -     Ambulatory referral to Wound Clinic  Hypotension, unspecified hypotension type Encouraged patient to increase midodrine to 3 times per day. Patient to follow   Rhonchi at both lung bases Discussed with patient and son use of incentive  spirometer. Encouraged him to turn, cough, deep breathe. No signs of infection. Encouraged close follow up for patient to be evaluated. Will consider imaging if symptoms persist.  Will order repeat CT scan based on review of CT scan from 11/02/22. If patient has any systemic symptoms of infection, would treat with Antibiotics.  - CT Chest Wo Contrast; Future  IMPRESSION: Cluster of irregular nodular densities are noted posteriorly in right upper lobe most consistent with atypical inflammation or mycobacterium. Follow-up chest CT in 2-3 weeks is recommended to ensure resolution or stability.   Irregularity involving the adjacent endplates of T12-L1 which most likely represents progressive degenerative disc disease, but discitis cannot be excluded.   Extensive coronary artery calcifications are noted suggesting coronary artery disease.   Fatty replacement of the pancreas.   Urinary bladder and prostate gland are not well visualized due to scatter artifact arising from bilateral hip arthroplasties.   Aortic Atherosclerosis (ICD10-I70.0).     Electronically Signed   By: Lupita Raider M.D.   On: 11/02/2022 20:55  Abnormal chest CT Imaging as below. Will communicate results to patient once available. Will await results to determine next steps.  - CT Chest Wo Contrast; Future  9. Suspected soft tissue infection Imaging as below. Will communicate results to patient once available. Will await results to determine next steps.  - CT Chest Wo Contrast; Future  Continue all other maintenance medications.  Follow up plan: Return in about 2 weeks (around 12/01/2022) for BP follow up.  Written and verbal instructions provided   The above assessment and management plan was discussed with the patient. The patient verbalized understanding of and has agreed to the management plan. Patient is aware to call the clinic if they develop any new symptoms or if symptoms persist or worsen. Patient  is aware when to return to the clinic for a follow-up visit. Patient educated on when it is appropriate to go to the emergency department.   Neale Burly, DNP-FNP [facility] [address] [place], [place] [address] [phone]

## 2022-11-17 NOTE — Progress Notes (Signed)
Name: [name] DOB: 09-29-1932 MRN: [id number]  History of Present Illness: [name] is a 89 y.o. male who presents today as a new patient at [facility]. All available relevant medical records have been reviewed. He is accompanied by his son [name], who assists with providing history.  He reports chief complaint of urinary retention.  Recent history:  11/02/2022 - 11/07/2022: Admitted for acute metabolic encephalopathy, AKI, acute urinary retention.  - Had 2 L of urine output after Foley insertion.  - Failed voiding trial on 11/04/2022; Foley replaced that evening. - Normal UA; no evidence of UTI. - Started on Flomax 0.4 mg daily.  Today: He denies history of urinary retention prior to current episode. He states that about 6-8 months ago he started having urinary incontinence without sensory awareness, being unable to sense the urge to void, and being unable to void volitionally even when he would try. He has been wearing Depends. He denies flank pain or abdominal pain today.  Fall Screening: Do you usually have a device to assist in your mobility? Yes - wheelchair  Medications: Current Outpatient Medications  Medication Sig Dispense Refill   acetaminophen (TYLENOL) 500 MG tablet Take 500 mg by mouth every 6 (six) hours as needed for mild pain (pain score 1-3).     AMBULATORY NON FORMULARY MEDICATION Order for home health nursing. Exchange indwelling Foley catheter on or around 12/05/2022 and once every 4 weeks after that. Irrigate catheter with sterile water daily as needed to prevent clogging. 1 each 0   cyanocobalamin (VITAMIN B12) 1000 MCG/ML injection Inject 1 mL (1,000 mcg total) into the skin every 30 (thirty) days. 3 mL 3   dorzolamide-timolol (COSOPT) 22.3-6.8 MG/ML ophthalmic solution Place 1 drop into both eyes 2 (two) times daily.     insulin NPH-regular Human (70-30) 100 UNIT/ML injection Inject 10 Units into the skin daily with breakfast.      Insulin Syringe-Needle U-100 (RELION INSULIN SYRINGE) 31G X 15/64" 0.3 ML MISC Used to inject insulin BID prn 90 each 1   midodrine (PROAMATINE) 2.5 MG tablet Take 1 tablet (2.5 mg total) by mouth 3 (three) times daily with meals. To raise blood pressure 90 tablet 5   Multiple Vitamin (MULTIVITAMIN WITH MINERALS) TABS tablet Take 1 tablet by mouth daily.     Sennosides 25 MG TABS Take 1 tablet by mouth See admin instructions. Every third day take 25 mg by mouth.     sennosides-docusate sodium (SENOKOT-S) 8.6-50 MG tablet Take 1 tablet by mouth See admin instructions. Every third day take 25 mg by mouth.     simethicone (MYLICON) 80 MG chewable tablet Chew 80 mg by mouth every 6 (six) hours as needed for flatulence.     tamsulosin (FLOMAX) 0.4 MG CAPS capsule Take 1 capsule (0.4 mg total) by mouth daily after supper. 30 capsule 1   VITAMIN D, ERGOCALCIFEROL, PO Take 1 tablet by mouth daily.     Zinc Acetate, Oral, (ZINC ACETATE PO) Take 1 tablet by mouth daily.     No current facility-administered medications for this visit.    Allergies: Allergies  Allergen Reactions   Morphine And Codeine Nausea And Vomiting   Morphine Nausea And Vomiting    Past Medical History:  Diagnosis Date   Arthritis    Cataracts, both eyes    Diabetes mellitus without complication (HCC)    takes lisinopril for kidneys   S/P hip replacement, bilateral    S/P knee replacement left  2011, right 2012   Past Surgical History:  Procedure Laterality Date   BACK SURGERY  March 23, 2011   lower back    IRRIGATION AND DEBRIDEMENT KNEE Left 07/29/2014   Procedure: IRRIGATION AND DEBRIDEMENT WITH BURSECTOMY OF LEFT  KNEE ;  Surgeon: Ollen Gross, MD;  Location: [facility];  Service: Orthopedics;  Laterality: Left;   JOINT REPLACEMENT     knee X 2    TOTAL HIP ARTHROPLASTY  11/02/2011   Procedure: TOTAL HIP ARTHROPLASTY;  Surgeon: Loanne Drilling, MD;  Location: [facility];  Service: Orthopedics;  Laterality: Left;    TOTAL HIP ARTHROPLASTY Right 02/24/2012   Procedure: TOTAL HIP ARTHROPLASTY;  Surgeon: Loanne Drilling, MD;  Location: [facility];  Service: Orthopedics;  Laterality: Right;   Family History  Problem Relation Age of Onset   Colon cancer Neg Hx    Social History   Socioeconomic History   Marital status: Divorced    Spouse name: Not on file   Number of children: 2   Years of education: Not on file   Highest education level: Not on file  Occupational History   Not on file  Tobacco Use   Smoking status: Former    Current packs/day: 0.00    Average packs/day: 1 pack/day for 20.0 years (20.0 ttl pk-yrs)    Types: Cigarettes    Start date: 01/11/1959    Quit date: 01/11/1979    Years since quitting: 43.8   Smokeless tobacco: Former    Quit date: 08/23/2011  Vaping Use   Vaping status: Never Used  Substance and Sexual Activity   Alcohol use: Not Currently    Comment: occasionally    Drug use: No   Sexual activity: Never  Other Topics Concern   Not on file  Social History Narrative   Not on file   Social Determinants of Health   Financial Resource Strain: Not on file  Food Insecurity: No Food Insecurity (11/07/2022)   Hunger Vital Sign    Worried About Running Out of Food in the Last Year: Never true    Ran Out of Food in the Last Year: Never true  Transportation Needs: No Transportation Needs (11/07/2022)   PRAPARE - Administrator, Civil Service (Medical): No    Lack of Transportation (Non-Medical): No  Physical Activity: Not on file  Stress: Not on file  Social Connections: Not on file  Intimate Partner Violence: Not At Risk (11/07/2022)   Humiliation, Afraid, Rape, and Kick questionnaire    Fear of Current or Ex-Partner: No    Emotionally Abused: No    Physically Abused: No    Sexually Abused: No    SUBJECTIVE  Review of Systems - limited Constitutional: Patient denies any unintentional weight loss or change in strength Cardiovascular: Patient denies chest  pain  Respiratory: Patient denies shortness of breath Gastrointestinal: Patient denies nausea, vomiting, constipation, or diarrhea Musculoskeletal: Patient reports weakness Allergic/Immunologic: Patient denies recent allergic reaction(s) Hematologic/Lymphatic: Patient denies bleeding tendencies Endocrine: Patient denies heat/cold intolerance  GU: As per HPI.  OBJECTIVE Vitals:   11/21/22 0951  BP: (!) 93/54  Pulse: (!) 56  Temp: 98.6 F (37 C)   Body mass index is 27.62 kg/m.  Physical Examination Constitutional: No obvious distress; patient is non-toxic appearing  Cardiovascular: No visible lower extremity edema.  Respiratory: The patient does not have audible wheezing/stridor; respirations do not appear labored  Gastrointestinal: Abdomen non-distended Musculoskeletal: Normal ROM of UEs  Skin: No obvious rashes/open  sores  Neurologic: CN 2-12 grossly intact Psychiatric: Answered questions appropriately; mildly tangential Hematologic/Lymphatic/Immunologic: No obvious bruises or sites of spontaneous bleeding   ASSESSMENT Acute urinary retention - Plan: AMBULATORY NON FORMULARY MEDICATION  AKI (acute kidney injury) (HCC)  Foley catheter in place - Plan: AMBULATORY NON FORMULARY MEDICATION  Hospital discharge follow-up  Physical debility  We discussed pt's urinary retention and possible etiologies including temporary detrusor areflexia, neurogenic bladder, BPH, constipation, anticholinergic medication use. His risk factors for neurogenic bladder were discussed including age, T2DM, prior nicotine use. 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   We discussed risk related to chronic urinary retention / incomplete bladder emptying including but not limited to: bladder discomfort I pain, overflow urinary incontinence (which can result in skin breakdown / wounds), recurrent UTls, pyelonephritis, urosepsis, kidney damage/  failure, decreased kidney function requiring dialysis.  Not a candidate for clean intermittent catheterization (CIC) based on his weakness and ambulatory dysfunction.   We discussed the option to do an office voiding trial, however he was advised he is likely at high risk for recurrent urinary retention. We discussed option to further evaluate with office cystoscopy and/or urodynamic testing. He elected to forgo voiding trial or further evaluation in favor of keeping indwelling Foley catheter long term.   We discussed that one significant disadvantage with long term use of an indwelling Foley catheter is the risk for erosion of the urethral sphincter, which may result in irreversible gross urinary incontinence over time. Discussed option to consider SP tube placement by IR as an alternative; patient declined.   He was informed that the Foley catheter should be exchanged every 4 weeks by a nurse (either in office or via home health) to prevent catheter from becoming clogged with sediment. He elected to have his home health nursing perform that; printed order given to patient's son for that team.  Will plan for follow up with Urology in 3 months or sooner if needed. Pt verbalized understanding and agreement. All questions were answered.  PLAN Advised the following: Foley catheter continued. Catheter exchange monthly by home health. Return in about 3 months (around 02/21/2023) for f/u with Evette Georges, NP (chronic Foley catheter).  No orders of the defined types were placed in this encounter.  Total time spent caring for the patient today was over 45 minutes. This includes time spent on the date of the visit reviewing the patient's chart before the visit, time spent during the visit, and time spent after the visit on documentation. Over 50% of that time was spent in face-to-face time with this patient for direct counseling. E&M based on time and complexity of medical decision making.  It has been  explained that the patient is to follow regularly with their PCP in addition to all other providers involved in their care and to follow instructions provided by these respective offices. Patient advised to contact urology clinic if any urologic-pertaining questions, concerns, new symptoms or problems arise in the interim period.  There are no Patient Instructions on file for this visit.  Electronically signed by:  Donnita Falls, MSN, FNP-C, CUNP 11/21/2022 11:45 AM

## 2022-11-18 ENCOUNTER — Ambulatory Visit: Payer: Self-pay | Admitting: Urology

## 2022-11-18 LAB — CBC WITH DIFFERENTIAL/PLATELET
Basophils Absolute: 0 10*3/uL (ref 0.0–0.2)
Basos: 1 %
EOS (ABSOLUTE): 0 10*3/uL (ref 0.0–0.4)
Eos: 0 %
Hematocrit: 29.3 % — ABNORMAL LOW (ref 37.5–51.0)
Hemoglobin: 9.3 g/dL — ABNORMAL LOW (ref 13.0–17.7)
Immature Grans (Abs): 0 10*3/uL (ref 0.0–0.1)
Immature Granulocytes: 1 %
Lymphocytes Absolute: 0.9 10*3/uL (ref 0.7–3.1)
Lymphs: 14 %
MCH: 31.5 pg (ref 26.6–33.0)
MCHC: 31.7 g/dL (ref 31.5–35.7)
MCV: 99 fL — ABNORMAL HIGH (ref 79–97)
Monocytes Absolute: 0.6 10*3/uL (ref 0.1–0.9)
Monocytes: 9 %
Neutrophils Absolute: 5 10*3/uL (ref 1.4–7.0)
Neutrophils: 75 %
Platelets: 438 10*3/uL (ref 150–450)
RBC: 2.95 x10E6/uL — ABNORMAL LOW (ref 4.14–5.80)
RDW: 12.3 % (ref 11.6–15.4)
WBC: 6.5 10*3/uL (ref 3.4–10.8)

## 2022-11-18 LAB — CMP14+EGFR
ALT: 13 [IU]/L (ref 0–44)
AST: 13 [IU]/L (ref 0–40)
Albumin: 3.1 g/dL — ABNORMAL LOW (ref 3.7–4.7)
Alkaline Phosphatase: 96 [IU]/L (ref 44–121)
BUN/Creatinine Ratio: 15 (ref 10–24)
BUN: 21 mg/dL (ref 8–27)
Bilirubin Total: 0.4 mg/dL (ref 0.0–1.2)
CO2: 21 mmol/L (ref 20–29)
Calcium: 9.1 mg/dL (ref 8.6–10.2)
Chloride: 97 mmol/L (ref 96–106)
Creatinine, Ser: 1.4 mg/dL — ABNORMAL HIGH (ref 0.76–1.27)
Globulin, Total: 3.9 g/dL (ref 1.5–4.5)
Glucose: 232 mg/dL — ABNORMAL HIGH (ref 70–99)
Potassium: 4.8 mmol/L (ref 3.5–5.2)
Sodium: 131 mmol/L — ABNORMAL LOW (ref 134–144)
Total Protein: 7 g/dL (ref 6.0–8.5)
eGFR: 48 mL/min/{1.73_m2} — ABNORMAL LOW (ref >59)

## 2022-11-18 LAB — MAGNESIUM: Magnesium: 1.7 mg/dL (ref 1.6–2.3)

## 2022-11-20 ENCOUNTER — Other Ambulatory Visit: Payer: Self-pay | Admitting: Family Medicine

## 2022-11-20 DIAGNOSIS — L89151 Pressure ulcer of sacral region, stage 1: Secondary | ICD-10-CM

## 2022-11-20 DIAGNOSIS — L89623 Pressure ulcer of left heel, stage 3: Secondary | ICD-10-CM

## 2022-11-20 NOTE — Telephone Encounter (Signed)
Referral placed, as requested WS 

## 2022-11-21 ENCOUNTER — Ambulatory Visit (INDEPENDENT_AMBULATORY_CARE_PROVIDER_SITE_OTHER): Payer: Medicare Other | Admitting: Urology

## 2022-11-21 ENCOUNTER — Encounter: Payer: Self-pay | Admitting: Urology

## 2022-11-21 VITALS — BP 93/54 | HR 56 | Temp 98.6°F | Ht 71.0 in | Wt 198.0 lb

## 2022-11-21 DIAGNOSIS — N179 Acute kidney failure, unspecified: Secondary | ICD-10-CM

## 2022-11-21 DIAGNOSIS — Z09 Encounter for follow-up examination after completed treatment for conditions other than malignant neoplasm: Secondary | ICD-10-CM

## 2022-11-21 DIAGNOSIS — Z978 Presence of other specified devices: Secondary | ICD-10-CM

## 2022-11-21 DIAGNOSIS — R338 Other retention of urine: Secondary | ICD-10-CM

## 2022-11-21 DIAGNOSIS — R5381 Other malaise: Secondary | ICD-10-CM

## 2022-11-21 MED ORDER — AMBULATORY NON FORMULARY MEDICATION: 0 refills | Status: DC

## 2022-11-22 ENCOUNTER — Other Ambulatory Visit: Payer: Self-pay | Admitting: Family Medicine

## 2022-11-22 ENCOUNTER — Ambulatory Visit: Payer: Medicare Other

## 2022-11-22 ENCOUNTER — Telehealth: Payer: Self-pay | Admitting: *Deleted

## 2022-11-22 DIAGNOSIS — I10 Essential (primary) hypertension: Secondary | ICD-10-CM | POA: Diagnosis not present

## 2022-11-22 DIAGNOSIS — G319 Degenerative disease of nervous system, unspecified: Secondary | ICD-10-CM

## 2022-11-22 DIAGNOSIS — N179 Acute kidney failure, unspecified: Secondary | ICD-10-CM | POA: Diagnosis not present

## 2022-11-22 DIAGNOSIS — E1136 Type 2 diabetes mellitus with diabetic cataract: Secondary | ICD-10-CM | POA: Diagnosis not present

## 2022-11-22 DIAGNOSIS — N139 Obstructive and reflux uropathy, unspecified: Secondary | ICD-10-CM

## 2022-11-22 DIAGNOSIS — I2510 Atherosclerotic heart disease of native coronary artery without angina pectoris: Secondary | ICD-10-CM

## 2022-11-22 DIAGNOSIS — E878 Other disorders of electrolyte and fluid balance, not elsewhere classified: Secondary | ICD-10-CM | POA: Diagnosis not present

## 2022-11-22 DIAGNOSIS — R339 Retention of urine, unspecified: Secondary | ICD-10-CM | POA: Diagnosis not present

## 2022-11-22 DIAGNOSIS — G9341 Metabolic encephalopathy: Secondary | ICD-10-CM

## 2022-11-22 DIAGNOSIS — E860 Dehydration: Secondary | ICD-10-CM

## 2022-11-22 MED ORDER — MIDODRINE HCL 5 MG PO TABS: 5.0000 mg | ORAL_TABLET | Freq: Three times a day (TID) | ORAL | 1 refills | Status: DC

## 2022-11-22 NOTE — Telephone Encounter (Signed)
[name] at [facility] aware

## 2022-11-22 NOTE — Telephone Encounter (Signed)
 Copied from CRM [id number]. Topic: General - Other >> Nov 22, 2022  1:38 PM Roswell Nickel wrote: Reason for CRM: [facility], calling to inform doctor about pt BP siting 120/50,  standing 80/50  patient complaining of legs been weak . Kelly contact [phone]

## 2022-11-22 NOTE — Telephone Encounter (Signed)
I sent in a scrip for a stronger dose of midodrine. Take it (5 mg) three times a day.

## 2022-11-24 ENCOUNTER — Telehealth: Payer: Self-pay | Admitting: Family Medicine

## 2022-11-24 DIAGNOSIS — N179 Acute kidney failure, unspecified: Secondary | ICD-10-CM | POA: Diagnosis not present

## 2022-11-24 DIAGNOSIS — R339 Retention of urine, unspecified: Secondary | ICD-10-CM | POA: Diagnosis not present

## 2022-11-24 DIAGNOSIS — G9341 Metabolic encephalopathy: Secondary | ICD-10-CM | POA: Diagnosis not present

## 2022-11-24 DIAGNOSIS — E878 Other disorders of electrolyte and fluid balance, not elsewhere classified: Secondary | ICD-10-CM | POA: Diagnosis not present

## 2022-11-24 DIAGNOSIS — N139 Obstructive and reflux uropathy, unspecified: Secondary | ICD-10-CM | POA: Diagnosis not present

## 2022-11-24 DIAGNOSIS — E1136 Type 2 diabetes mellitus with diabetic cataract: Secondary | ICD-10-CM | POA: Diagnosis not present

## 2022-11-24 NOTE — Telephone Encounter (Signed)
Copied from CRM [id number]. Topic: Clinical - Home Health Verbal Orders >> Nov 24, 2022  1:14 PM Theodis Sato wrote: Reason for CRM Tresa Endo from [facility] needs to speak with Dr. Darlyn Read nurse regarding PT's blood pressure and 2 recent black out episodes-potential seizure episodes

## 2022-11-24 NOTE — Telephone Encounter (Signed)
PT, KELLY, FROM HOME HEALTH, PATIENTS FAMILY REPORT BLACK OUT EPISODES, SEIZURE LIKE ACTIVITY, EYES OPEN, HEAD DROPS, GOES RIGID, DOESN'T RESPOND, THEN COMES TO, PT REPORTS WITNESSING ONE TODAY, SITTING BP 118/96, STANDING BP DROPS TO 96/50.  REVIEWED WITH DR Darlyn Read AND ADVISED PT PER DR STACKS ORDERS IF ANOTHER EPISODE HAPPENS TO GO TO ER. NO MED ADJUSTMENTS AT THIS TIME. PT VOICES UNDERSTANDING AND WILL ADVISE FAMILY.

## 2022-11-24 NOTE — Progress Notes (Signed)
Labs stable. Recommend patient follow up with PCP and specialty.

## 2022-11-24 NOTE — Telephone Encounter (Signed)
Returned call, no answer, left message to return call.

## 2022-11-24 NOTE — Telephone Encounter (Signed)
CALLED AGAIN, NO ANSWER  °

## 2022-11-25 DIAGNOSIS — N139 Obstructive and reflux uropathy, unspecified: Secondary | ICD-10-CM | POA: Diagnosis not present

## 2022-11-25 DIAGNOSIS — N179 Acute kidney failure, unspecified: Secondary | ICD-10-CM | POA: Diagnosis not present

## 2022-11-25 DIAGNOSIS — E1136 Type 2 diabetes mellitus with diabetic cataract: Secondary | ICD-10-CM | POA: Diagnosis not present

## 2022-11-25 DIAGNOSIS — E878 Other disorders of electrolyte and fluid balance, not elsewhere classified: Secondary | ICD-10-CM | POA: Diagnosis not present

## 2022-11-25 DIAGNOSIS — R339 Retention of urine, unspecified: Secondary | ICD-10-CM | POA: Diagnosis not present

## 2022-11-25 DIAGNOSIS — G9341 Metabolic encephalopathy: Secondary | ICD-10-CM | POA: Diagnosis not present

## 2022-11-28 ENCOUNTER — Ambulatory Visit: Payer: Medicare Other | Admitting: Family Medicine

## 2022-11-28 ENCOUNTER — Ambulatory Visit (HOSPITAL_COMMUNITY): Admission: RE | Admit: 2022-11-28 | Payer: Medicare Other | Source: Ambulatory Visit

## 2022-11-28 DIAGNOSIS — Z794 Long term (current) use of insulin: Secondary | ICD-10-CM | POA: Diagnosis not present

## 2022-11-28 DIAGNOSIS — R0989 Other specified symptoms and signs involving the circulatory and respiratory systems: Secondary | ICD-10-CM | POA: Diagnosis not present

## 2022-11-28 DIAGNOSIS — Z885 Allergy status to narcotic agent status: Secondary | ICD-10-CM | POA: Diagnosis not present

## 2022-11-28 DIAGNOSIS — R001 Bradycardia, unspecified: Secondary | ICD-10-CM | POA: Diagnosis not present

## 2022-11-28 DIAGNOSIS — J189 Pneumonia, unspecified organism: Secondary | ICD-10-CM | POA: Diagnosis not present

## 2022-11-28 DIAGNOSIS — S2242XD Multiple fractures of ribs, left side, subsequent encounter for fracture with routine healing: Secondary | ICD-10-CM | POA: Diagnosis not present

## 2022-11-28 DIAGNOSIS — R918 Other nonspecific abnormal finding of lung field: Secondary | ICD-10-CM | POA: Diagnosis not present

## 2022-11-28 DIAGNOSIS — E119 Type 2 diabetes mellitus without complications: Secondary | ICD-10-CM | POA: Diagnosis not present

## 2022-11-28 DIAGNOSIS — R531 Weakness: Secondary | ICD-10-CM | POA: Diagnosis not present

## 2022-11-28 DIAGNOSIS — Z20822 Contact with and (suspected) exposure to covid-19: Secondary | ICD-10-CM | POA: Diagnosis not present

## 2022-11-28 DIAGNOSIS — I959 Hypotension, unspecified: Secondary | ICD-10-CM | POA: Diagnosis not present

## 2022-11-28 DIAGNOSIS — R739 Hyperglycemia, unspecified: Secondary | ICD-10-CM | POA: Diagnosis not present

## 2022-11-28 DIAGNOSIS — E860 Dehydration: Secondary | ICD-10-CM | POA: Diagnosis not present

## 2022-11-29 ENCOUNTER — Telehealth: Payer: Self-pay | Admitting: Family Medicine

## 2022-11-29 ENCOUNTER — Other Ambulatory Visit: Payer: Self-pay | Admitting: Family Medicine

## 2022-11-29 DIAGNOSIS — E878 Other disorders of electrolyte and fluid balance, not elsewhere classified: Secondary | ICD-10-CM | POA: Diagnosis not present

## 2022-11-29 DIAGNOSIS — N139 Obstructive and reflux uropathy, unspecified: Secondary | ICD-10-CM | POA: Diagnosis not present

## 2022-11-29 DIAGNOSIS — G9341 Metabolic encephalopathy: Secondary | ICD-10-CM | POA: Diagnosis not present

## 2022-11-29 DIAGNOSIS — N179 Acute kidney failure, unspecified: Secondary | ICD-10-CM | POA: Diagnosis not present

## 2022-11-29 DIAGNOSIS — R339 Retention of urine, unspecified: Secondary | ICD-10-CM | POA: Diagnosis not present

## 2022-11-29 DIAGNOSIS — E1136 Type 2 diabetes mellitus with diabetic cataract: Secondary | ICD-10-CM | POA: Diagnosis not present

## 2022-11-29 MED ORDER — MIDODRINE HCL 10 MG PO TABS: 10.0000 mg | ORAL_TABLET | Freq: Three times a day (TID) | ORAL | 2 refills | Status: DC

## 2022-11-29 NOTE — Telephone Encounter (Signed)
SON AWARE

## 2022-11-29 NOTE — Telephone Encounter (Signed)
I sent an incease in the BP raising med, midodrine to [facility] [employer]

## 2022-11-29 NOTE — Telephone Encounter (Signed)
Copied from CRM [id number]. Topic: General - Other >> Nov 29, 2022  2:50 PM Judeth Cornfield R wrote: Reason for CRM: Marcelino Duster at [facility], states that PT's seated BP is 116/64 and his standing BP is 64/40 and PT claims not to be dizzy. PT's BP returned to seated number after 5 minutes of being seated. Marcelino Duster states PT was taken to ER yesterday and given fluids then sent him home so they didn't want to bring him to the ER again.

## 2022-11-30 ENCOUNTER — Telehealth: Payer: Self-pay | Admitting: Family Medicine

## 2022-11-30 DIAGNOSIS — G9341 Metabolic encephalopathy: Secondary | ICD-10-CM | POA: Diagnosis not present

## 2022-11-30 DIAGNOSIS — N179 Acute kidney failure, unspecified: Secondary | ICD-10-CM | POA: Diagnosis not present

## 2022-11-30 DIAGNOSIS — E1136 Type 2 diabetes mellitus with diabetic cataract: Secondary | ICD-10-CM | POA: Diagnosis not present

## 2022-11-30 DIAGNOSIS — R339 Retention of urine, unspecified: Secondary | ICD-10-CM | POA: Diagnosis not present

## 2022-11-30 DIAGNOSIS — E878 Other disorders of electrolyte and fluid balance, not elsewhere classified: Secondary | ICD-10-CM | POA: Diagnosis not present

## 2022-11-30 DIAGNOSIS — N139 Obstructive and reflux uropathy, unspecified: Secondary | ICD-10-CM | POA: Diagnosis not present

## 2022-11-30 NOTE — Telephone Encounter (Signed)
Copied from CRM [id number]. Topic: Clinical - Medical Advice >> Nov 30, 2022 11:02 AM Dollene Primrose wrote: Reason for CRM: [facility] [phone] Calling to get advised on next steps for patient. He was seen in ER and has possible pneumonia but Zithromax wasn't sent, also patient experiencing passing out spells.

## 2022-11-30 NOTE — Telephone Encounter (Signed)
Copied from CRM [id number]. Topic: Clinical - Prescription Issue >> Nov 30, 2022  9:50 AM Prudencio Pair wrote: Reason for CRM: Patient's son, Ethelene Browns, called stating that the Home Health nurse stated that she got a message stating that a prescription was called in for patient. Son states that nurse said it's either one or two antibiotics because they think pt has pneumonia. Ethelene Browns states he called the pharmacy and was advised that do not have a prescription for any antibiotics. Son says he was only aware of the medication that was sent to increase his father's blood pressure. Ethelene Browns would like for either Dr. Darlyn Read or nurse to give him a call in regards to this. CB # [phone].

## 2022-11-30 NOTE — Telephone Encounter (Signed)
Pt. Needs to be seen

## 2022-12-01 ENCOUNTER — Encounter: Payer: Self-pay | Admitting: Family Medicine

## 2022-12-01 ENCOUNTER — Ambulatory Visit (INDEPENDENT_AMBULATORY_CARE_PROVIDER_SITE_OTHER): Payer: Medicare Other | Admitting: Family Medicine

## 2022-12-01 VITALS — BP 90/53 | HR 64 | Temp 97.8°F | Ht 71.0 in | Wt 200.0 lb

## 2022-12-01 DIAGNOSIS — I959 Hypotension, unspecified: Secondary | ICD-10-CM

## 2022-12-01 DIAGNOSIS — G9341 Metabolic encephalopathy: Secondary | ICD-10-CM | POA: Diagnosis not present

## 2022-12-01 NOTE — Progress Notes (Signed)
Subjective:  Patient ID: [name], male    DOB: [birth year]  Age: 89 y.o. MRN: [id number]  CC: Near Syncope (spells) and Hypotension   HPI [name] presents for low blood pressure. BP low in spite of DC of all BP lowering meds and addition of midodrine at maximal dose to support BP. Pt. Has been drinking fluids with sodium as well to support BP. Nothing has succeeded in raising BP. As a result, every time he is assisted to standing position he becomes dizzy and faint for several minutes. He would fall without someone to support him. He cannot walk or make transfers without support due to the risk. This problem has progressed since recent hospital stay for acute metabolic encephalopathy last month.      11/17/2022    9:37 AM 10/31/2022   11:47 AM 09/21/2022    3:41 PM  Depression screen PHQ 2/9  Decreased Interest 2 2 2   Down, Depressed, Hopeless 2 2 1   PHQ - 2 Score 4 4 3   Altered sleeping 2 2 0  Tired, decreased energy 3 0 2  Change in appetite 1 0 0  Feeling bad or failure about yourself  2 1 1   Trouble concentrating 1 1 0  Moving slowly or fidgety/restless 0 1 0  Suicidal thoughts 0 0 0  PHQ-9 Score 13 9 6   Difficult doing work/chores Very difficult Somewhat difficult Very difficult    History Kayd has a past medical history of Arthritis, Cataracts, both eyes, Diabetes mellitus without complication (HCC), S/P hip replacement, bilateral, and S/P knee replacement (left 2011, right 2012).   He has a past surgical history that includes Joint replacement; Back surgery (March 23, 2011); Total hip arthroplasty (11/02/2011); Total hip arthroplasty (Right, 02/24/2012); and Irrigation and debridement knee (Left, 07/29/2014).   His family history is not on file.He reports that he quit smoking about 43 years ago. His smoking use included cigarettes. He started smoking about 63 years ago. He has a 20 pack-year smoking history. He quit smokeless tobacco use about 11 years  ago. He reports that he does not currently use alcohol. He reports that he does not use drugs.    ROS Review of Systems  Constitutional:  Positive for activity change (decreased) and fatigue.  HENT: Negative.    Eyes:  Negative for visual disturbance.  Respiratory:  Negative for cough and shortness of breath.   Cardiovascular:  Negative for chest pain and leg swelling.  Gastrointestinal:  Negative for abdominal pain, diarrhea, nausea and vomiting.  Musculoskeletal:  Negative for arthralgias and myalgias.  Skin:  Negative for rash.  Neurological:  Positive for dizziness, weakness and light-headedness.  Psychiatric/Behavioral:  Negative for sleep disturbance.     Objective:  BP (!) 90/53   Pulse 64   Temp 97.8 F (36.6 C)   Ht 5\' 11"  (1.803 m)   Wt 200 lb (90.7 kg)   SpO2 99%   BMI 27.89 kg/m   BP Readings from Last 3 Encounters:  12/01/22 (!) 90/53  11/21/22 (!) 93/54  11/17/22 98/61    Wt Readings from Last 3 Encounters:  12/01/22 200 lb (90.7 kg)  11/21/22 198 lb (89.8 kg)  11/17/22 198 lb (89.8 kg)     Physical Exam Constitutional:      General: He is not in acute distress.    Appearance: He is well-developed.  HENT:     Head: Normocephalic and atraumatic.     Right Ear: External ear normal.  Left Ear: External ear normal.     Nose: Nose normal.  Eyes:     Conjunctiva/sclera: Conjunctivae normal.     Pupils: Pupils are equal, round, and reactive to light.  Cardiovascular:     Rate and Rhythm: Normal rate and regular rhythm.     Heart sounds: Normal heart sounds. No murmur heard. Pulmonary:     Effort: Pulmonary effort is normal. No respiratory distress.     Breath sounds: Normal breath sounds. No wheezing or rales.  Abdominal:     Palpations: Abdomen is soft.     Tenderness: There is no abdominal tenderness.  Musculoskeletal:        General: Normal range of motion.     Cervical back: Normal range of motion and neck supple.     Comments: Unable to  stand. WC bound   Skin:    General: Skin is warm and dry.  Neurological:     Mental Status: He is alert and oriented to person, place, and time.     Deep Tendon Reflexes: Reflexes are normal and symmetric.  Psychiatric:        Behavior: Behavior normal.        Thought Content: Thought content normal.        Judgment: Judgment normal.       Assessment & Plan:   Burnie was seen today for near syncope and hypotension.  Diagnoses and all orders for this visit:  Hypotension, unspecified hypotension type -     Ambulatory referral to Hospice  Acute metabolic encephalopathy    Due to severity of hypotension and co morbidities, I discussed with Pt. And son that he has very limited life expectancy. He has declined interventions including cardiology evaluation for possible pacemaker that could help with bradycardia and hypotension. He declined suprapubic catheter. Pt. States he no longer wants any medical intervention. He is ready for his demise, and feels it is imminent.  He Requests his son to be POA. He wants comfort care only and requests hospice. I confirmed that prognosis is less than 6 mos longevity remaining.    I am having Holley R. Mattia maintain his dorzolamide-timolol, cyanocobalamin, ReliOn Insulin Syringe, acetaminophen, (VITAMIN D, ERGOCALCIFEROL, PO), (Zinc Acetate, Oral, (ZINC ACETATE PO)), multivitamin with minerals, sennosides-docusate sodium, Sennosides, simethicone, insulin NPH-regular Human, tamsulosin, AMBULATORY NON FORMULARY MEDICATION, and midodrine.  Allergies as of 12/01/2022       Reactions   Morphine And Codeine Nausea And Vomiting   Morphine Nausea And Vomiting        Medication List        Accurate as of December 01, 2022 11:59 PM. If you have any questions, ask your nurse or doctor.          acetaminophen 500 MG tablet Commonly known as: TYLENOL Take 500 mg by mouth every 6 (six) hours as needed for mild pain (pain score 1-3).    AMBULATORY NON FORMULARY MEDICATION Order for home health nursing. Exchange indwelling Foley catheter on or around 12/05/2022 and once every 4 weeks after that. Irrigate catheter with sterile water daily as needed to prevent clogging.   cyanocobalamin 1000 MCG/ML injection Commonly known as: VITAMIN B12 Inject 1 mL (1,000 mcg total) into the skin every 30 (thirty) days.   dorzolamide-timolol 2-0.5 % ophthalmic solution Commonly known as: COSOPT Place 1 drop into both eyes 2 (two) times daily.   insulin NPH-regular Human (70-30) 100 UNIT/ML injection Inject 10 Units into the skin daily with breakfast.   midodrine  10 MG tablet Commonly known as: PROAMATINE Take 1 tablet (10 mg total) by mouth 3 (three) times daily with meals. To raise blood pressure   multivitamin with minerals Tabs tablet Take 1 tablet by mouth daily.   ReliOn Insulin Syringe 31G X 15/64" 0.3 ML Misc Generic drug: Insulin Syringe-Needle U-100 Used to inject insulin BID prn   Sennosides 25 MG Tabs Take 1 tablet by mouth See admin instructions. Every third day take 25 mg by mouth.   sennosides-docusate sodium 8.6-50 MG tablet Commonly known as: SENOKOT-S Take 1 tablet by mouth See admin instructions. Every third day take 25 mg by mouth.   simethicone 80 MG chewable tablet Commonly known as: MYLICON Chew 80 mg by mouth every 6 (six) hours as needed for flatulence.   tamsulosin 0.4 MG Caps capsule Commonly known as: FLOMAX Take 1 capsule (0.4 mg total) by mouth daily after supper.   VITAMIN D (ERGOCALCIFEROL) PO Take 1 tablet by mouth daily.   ZINC ACETATE PO Take 1 tablet by mouth daily.       1 hour was spent with pt care today. More than 1/2 in planning for future care and explaining his options including hospice.  Follow-up: No follow-ups on file.  [name], M.D.

## 2022-12-01 NOTE — Telephone Encounter (Signed)
Pt scheduled to see Dr Darlyn Read 12/01/2022

## 2022-12-05 ENCOUNTER — Other Ambulatory Visit: Payer: Self-pay | Admitting: Family Medicine

## 2022-12-05 ENCOUNTER — Telehealth: Payer: Self-pay | Admitting: *Deleted

## 2022-12-05 MED ORDER — LEVOFLOXACIN 500 MG PO TABS: 500.0000 mg | ORAL_TABLET | Freq: Every day | ORAL | 0 refills | Status: DC

## 2022-12-05 NOTE — Telephone Encounter (Signed)
If he is still having those symptoms he should fill the prescription I just sent in for Levofloxacin

## 2022-12-05 NOTE — Telephone Encounter (Signed)
Copied from CRM [id number]. Topic: Clinical - Prescription Issue >> Dec 05, 2022 12:16 PM Amy B wrote: Reason for CRM: Spoke with patient's son who stated patient was seen in the ER on 11/18 and was prescribed antibiotics for pneumonia; however, they still have not received the prescription.  Son requests a call back to discuss, [phone].  Patient's pharmacy is [facility] in [place], [place].

## 2022-12-05 NOTE — Telephone Encounter (Signed)
SON AWARE.

## 2022-12-06 DIAGNOSIS — I519 Heart disease, unspecified: Secondary | ICD-10-CM | POA: Diagnosis not present

## 2022-12-06 DIAGNOSIS — E1022 Type 1 diabetes mellitus with diabetic chronic kidney disease: Secondary | ICD-10-CM | POA: Diagnosis not present

## 2022-12-06 DIAGNOSIS — M1991 Primary osteoarthritis, unspecified site: Secondary | ICD-10-CM | POA: Diagnosis not present

## 2022-12-06 DIAGNOSIS — I951 Orthostatic hypotension: Secondary | ICD-10-CM | POA: Diagnosis not present

## 2022-12-06 DIAGNOSIS — G9341 Metabolic encephalopathy: Secondary | ICD-10-CM | POA: Diagnosis not present

## 2022-12-07 DIAGNOSIS — I951 Orthostatic hypotension: Secondary | ICD-10-CM | POA: Diagnosis not present

## 2022-12-07 DIAGNOSIS — I519 Heart disease, unspecified: Secondary | ICD-10-CM | POA: Diagnosis not present

## 2022-12-07 DIAGNOSIS — E1022 Type 1 diabetes mellitus with diabetic chronic kidney disease: Secondary | ICD-10-CM | POA: Diagnosis not present

## 2022-12-07 DIAGNOSIS — M1991 Primary osteoarthritis, unspecified site: Secondary | ICD-10-CM | POA: Diagnosis not present

## 2022-12-07 DIAGNOSIS — G9341 Metabolic encephalopathy: Secondary | ICD-10-CM | POA: Diagnosis not present

## 2022-12-11 DIAGNOSIS — M1991 Primary osteoarthritis, unspecified site: Secondary | ICD-10-CM | POA: Diagnosis not present

## 2022-12-11 DIAGNOSIS — I519 Heart disease, unspecified: Secondary | ICD-10-CM | POA: Diagnosis not present

## 2022-12-11 DIAGNOSIS — G9341 Metabolic encephalopathy: Secondary | ICD-10-CM | POA: Diagnosis not present

## 2022-12-11 DIAGNOSIS — E1022 Type 1 diabetes mellitus with diabetic chronic kidney disease: Secondary | ICD-10-CM | POA: Diagnosis not present

## 2022-12-11 DIAGNOSIS — I951 Orthostatic hypotension: Secondary | ICD-10-CM | POA: Diagnosis not present

## 2022-12-14 ENCOUNTER — Encounter (HOSPITAL_BASED_OUTPATIENT_CLINIC_OR_DEPARTMENT_OTHER): Payer: Medicare Other | Admitting: Internal Medicine

## 2022-12-14 DIAGNOSIS — M1991 Primary osteoarthritis, unspecified site: Secondary | ICD-10-CM | POA: Diagnosis not present

## 2022-12-14 DIAGNOSIS — E1022 Type 1 diabetes mellitus with diabetic chronic kidney disease: Secondary | ICD-10-CM | POA: Diagnosis not present

## 2022-12-14 DIAGNOSIS — I519 Heart disease, unspecified: Secondary | ICD-10-CM | POA: Diagnosis not present

## 2022-12-14 DIAGNOSIS — I951 Orthostatic hypotension: Secondary | ICD-10-CM | POA: Diagnosis not present

## 2022-12-14 DIAGNOSIS — G9341 Metabolic encephalopathy: Secondary | ICD-10-CM | POA: Diagnosis not present

## 2022-12-15 DIAGNOSIS — M1991 Primary osteoarthritis, unspecified site: Secondary | ICD-10-CM | POA: Diagnosis not present

## 2022-12-15 DIAGNOSIS — I951 Orthostatic hypotension: Secondary | ICD-10-CM | POA: Diagnosis not present

## 2022-12-15 DIAGNOSIS — I519 Heart disease, unspecified: Secondary | ICD-10-CM | POA: Diagnosis not present

## 2022-12-15 DIAGNOSIS — E1022 Type 1 diabetes mellitus with diabetic chronic kidney disease: Secondary | ICD-10-CM | POA: Diagnosis not present

## 2022-12-15 DIAGNOSIS — G9341 Metabolic encephalopathy: Secondary | ICD-10-CM | POA: Diagnosis not present

## 2022-12-19 DIAGNOSIS — I951 Orthostatic hypotension: Secondary | ICD-10-CM | POA: Diagnosis not present

## 2022-12-19 DIAGNOSIS — I519 Heart disease, unspecified: Secondary | ICD-10-CM | POA: Diagnosis not present

## 2022-12-19 DIAGNOSIS — E1022 Type 1 diabetes mellitus with diabetic chronic kidney disease: Secondary | ICD-10-CM | POA: Diagnosis not present

## 2022-12-19 DIAGNOSIS — M1991 Primary osteoarthritis, unspecified site: Secondary | ICD-10-CM | POA: Diagnosis not present

## 2022-12-19 DIAGNOSIS — G9341 Metabolic encephalopathy: Secondary | ICD-10-CM | POA: Diagnosis not present

## 2022-12-21 DIAGNOSIS — I951 Orthostatic hypotension: Secondary | ICD-10-CM | POA: Diagnosis not present

## 2022-12-21 DIAGNOSIS — M1991 Primary osteoarthritis, unspecified site: Secondary | ICD-10-CM | POA: Diagnosis not present

## 2022-12-21 DIAGNOSIS — I519 Heart disease, unspecified: Secondary | ICD-10-CM | POA: Diagnosis not present

## 2022-12-21 DIAGNOSIS — G9341 Metabolic encephalopathy: Secondary | ICD-10-CM | POA: Diagnosis not present

## 2022-12-21 DIAGNOSIS — E1022 Type 1 diabetes mellitus with diabetic chronic kidney disease: Secondary | ICD-10-CM | POA: Diagnosis not present

## 2022-12-23 DIAGNOSIS — I951 Orthostatic hypotension: Secondary | ICD-10-CM | POA: Diagnosis not present

## 2022-12-23 DIAGNOSIS — I519 Heart disease, unspecified: Secondary | ICD-10-CM | POA: Diagnosis not present

## 2022-12-23 DIAGNOSIS — G9341 Metabolic encephalopathy: Secondary | ICD-10-CM | POA: Diagnosis not present

## 2022-12-23 DIAGNOSIS — M1991 Primary osteoarthritis, unspecified site: Secondary | ICD-10-CM | POA: Diagnosis not present

## 2022-12-23 DIAGNOSIS — E1022 Type 1 diabetes mellitus with diabetic chronic kidney disease: Secondary | ICD-10-CM | POA: Diagnosis not present

## 2022-12-27 DIAGNOSIS — I519 Heart disease, unspecified: Secondary | ICD-10-CM | POA: Diagnosis not present

## 2022-12-27 DIAGNOSIS — E1022 Type 1 diabetes mellitus with diabetic chronic kidney disease: Secondary | ICD-10-CM | POA: Diagnosis not present

## 2022-12-27 DIAGNOSIS — I951 Orthostatic hypotension: Secondary | ICD-10-CM | POA: Diagnosis not present

## 2022-12-27 DIAGNOSIS — G9341 Metabolic encephalopathy: Secondary | ICD-10-CM | POA: Diagnosis not present

## 2022-12-27 DIAGNOSIS — M1991 Primary osteoarthritis, unspecified site: Secondary | ICD-10-CM | POA: Diagnosis not present

## 2022-12-28 DIAGNOSIS — I951 Orthostatic hypotension: Secondary | ICD-10-CM | POA: Diagnosis not present

## 2022-12-28 DIAGNOSIS — M1991 Primary osteoarthritis, unspecified site: Secondary | ICD-10-CM | POA: Diagnosis not present

## 2022-12-28 DIAGNOSIS — G9341 Metabolic encephalopathy: Secondary | ICD-10-CM | POA: Diagnosis not present

## 2022-12-28 DIAGNOSIS — I519 Heart disease, unspecified: Secondary | ICD-10-CM | POA: Diagnosis not present

## 2022-12-28 DIAGNOSIS — E1022 Type 1 diabetes mellitus with diabetic chronic kidney disease: Secondary | ICD-10-CM | POA: Diagnosis not present

## 2023-01-03 DIAGNOSIS — M1991 Primary osteoarthritis, unspecified site: Secondary | ICD-10-CM | POA: Diagnosis not present

## 2023-01-03 DIAGNOSIS — I951 Orthostatic hypotension: Secondary | ICD-10-CM | POA: Diagnosis not present

## 2023-01-03 DIAGNOSIS — I519 Heart disease, unspecified: Secondary | ICD-10-CM | POA: Diagnosis not present

## 2023-01-03 DIAGNOSIS — E1022 Type 1 diabetes mellitus with diabetic chronic kidney disease: Secondary | ICD-10-CM | POA: Diagnosis not present

## 2023-01-03 DIAGNOSIS — G9341 Metabolic encephalopathy: Secondary | ICD-10-CM | POA: Diagnosis not present

## 2023-01-06 DIAGNOSIS — I951 Orthostatic hypotension: Secondary | ICD-10-CM | POA: Diagnosis not present

## 2023-01-06 DIAGNOSIS — I519 Heart disease, unspecified: Secondary | ICD-10-CM | POA: Diagnosis not present

## 2023-01-06 DIAGNOSIS — M1991 Primary osteoarthritis, unspecified site: Secondary | ICD-10-CM | POA: Diagnosis not present

## 2023-01-06 DIAGNOSIS — G9341 Metabolic encephalopathy: Secondary | ICD-10-CM | POA: Diagnosis not present

## 2023-01-06 DIAGNOSIS — E1022 Type 1 diabetes mellitus with diabetic chronic kidney disease: Secondary | ICD-10-CM | POA: Diagnosis not present

## 2023-01-09 DIAGNOSIS — E1022 Type 1 diabetes mellitus with diabetic chronic kidney disease: Secondary | ICD-10-CM | POA: Diagnosis not present

## 2023-01-09 DIAGNOSIS — G9341 Metabolic encephalopathy: Secondary | ICD-10-CM | POA: Diagnosis not present

## 2023-01-09 DIAGNOSIS — I951 Orthostatic hypotension: Secondary | ICD-10-CM | POA: Diagnosis not present

## 2023-01-09 DIAGNOSIS — M1991 Primary osteoarthritis, unspecified site: Secondary | ICD-10-CM | POA: Diagnosis not present

## 2023-01-09 DIAGNOSIS — I519 Heart disease, unspecified: Secondary | ICD-10-CM | POA: Diagnosis not present

## 2023-01-10 DIAGNOSIS — M1991 Primary osteoarthritis, unspecified site: Secondary | ICD-10-CM | POA: Diagnosis not present

## 2023-01-10 DIAGNOSIS — I519 Heart disease, unspecified: Secondary | ICD-10-CM | POA: Diagnosis not present

## 2023-01-10 DIAGNOSIS — E1022 Type 1 diabetes mellitus with diabetic chronic kidney disease: Secondary | ICD-10-CM | POA: Diagnosis not present

## 2023-01-10 DIAGNOSIS — I951 Orthostatic hypotension: Secondary | ICD-10-CM | POA: Diagnosis not present

## 2023-01-10 DIAGNOSIS — G9341 Metabolic encephalopathy: Secondary | ICD-10-CM | POA: Diagnosis not present

## 2023-01-11 DIAGNOSIS — I519 Heart disease, unspecified: Secondary | ICD-10-CM | POA: Diagnosis not present

## 2023-01-11 DIAGNOSIS — M1991 Primary osteoarthritis, unspecified site: Secondary | ICD-10-CM | POA: Diagnosis not present

## 2023-01-11 DIAGNOSIS — G9341 Metabolic encephalopathy: Secondary | ICD-10-CM | POA: Diagnosis not present

## 2023-01-11 DIAGNOSIS — E1022 Type 1 diabetes mellitus with diabetic chronic kidney disease: Secondary | ICD-10-CM | POA: Diagnosis not present

## 2023-01-11 DIAGNOSIS — I951 Orthostatic hypotension: Secondary | ICD-10-CM | POA: Diagnosis not present

## 2023-01-12 DIAGNOSIS — I519 Heart disease, unspecified: Secondary | ICD-10-CM | POA: Diagnosis not present

## 2023-01-12 DIAGNOSIS — I951 Orthostatic hypotension: Secondary | ICD-10-CM | POA: Diagnosis not present

## 2023-01-12 DIAGNOSIS — E1022 Type 1 diabetes mellitus with diabetic chronic kidney disease: Secondary | ICD-10-CM | POA: Diagnosis not present

## 2023-01-12 DIAGNOSIS — M1991 Primary osteoarthritis, unspecified site: Secondary | ICD-10-CM | POA: Diagnosis not present

## 2023-01-12 DIAGNOSIS — G9341 Metabolic encephalopathy: Secondary | ICD-10-CM | POA: Diagnosis not present

## 2023-01-13 DIAGNOSIS — E1022 Type 1 diabetes mellitus with diabetic chronic kidney disease: Secondary | ICD-10-CM | POA: Diagnosis not present

## 2023-01-13 DIAGNOSIS — I951 Orthostatic hypotension: Secondary | ICD-10-CM | POA: Diagnosis not present

## 2023-01-13 DIAGNOSIS — I519 Heart disease, unspecified: Secondary | ICD-10-CM | POA: Diagnosis not present

## 2023-01-13 DIAGNOSIS — G9341 Metabolic encephalopathy: Secondary | ICD-10-CM | POA: Diagnosis not present

## 2023-01-13 DIAGNOSIS — M1991 Primary osteoarthritis, unspecified site: Secondary | ICD-10-CM | POA: Diagnosis not present

## 2023-01-17 DIAGNOSIS — M1991 Primary osteoarthritis, unspecified site: Secondary | ICD-10-CM | POA: Diagnosis not present

## 2023-01-17 DIAGNOSIS — G9341 Metabolic encephalopathy: Secondary | ICD-10-CM | POA: Diagnosis not present

## 2023-01-17 DIAGNOSIS — I519 Heart disease, unspecified: Secondary | ICD-10-CM | POA: Diagnosis not present

## 2023-01-17 DIAGNOSIS — I951 Orthostatic hypotension: Secondary | ICD-10-CM | POA: Diagnosis not present

## 2023-01-17 DIAGNOSIS — E1022 Type 1 diabetes mellitus with diabetic chronic kidney disease: Secondary | ICD-10-CM | POA: Diagnosis not present

## 2023-01-18 DIAGNOSIS — I519 Heart disease, unspecified: Secondary | ICD-10-CM | POA: Diagnosis not present

## 2023-01-18 DIAGNOSIS — I951 Orthostatic hypotension: Secondary | ICD-10-CM | POA: Diagnosis not present

## 2023-01-18 DIAGNOSIS — E1022 Type 1 diabetes mellitus with diabetic chronic kidney disease: Secondary | ICD-10-CM | POA: Diagnosis not present

## 2023-01-18 DIAGNOSIS — G9341 Metabolic encephalopathy: Secondary | ICD-10-CM | POA: Diagnosis not present

## 2023-01-18 DIAGNOSIS — M1991 Primary osteoarthritis, unspecified site: Secondary | ICD-10-CM | POA: Diagnosis not present

## 2023-01-19 DIAGNOSIS — I519 Heart disease, unspecified: Secondary | ICD-10-CM | POA: Diagnosis not present

## 2023-01-19 DIAGNOSIS — E1022 Type 1 diabetes mellitus with diabetic chronic kidney disease: Secondary | ICD-10-CM | POA: Diagnosis not present

## 2023-01-19 DIAGNOSIS — G9341 Metabolic encephalopathy: Secondary | ICD-10-CM | POA: Diagnosis not present

## 2023-01-19 DIAGNOSIS — M1991 Primary osteoarthritis, unspecified site: Secondary | ICD-10-CM | POA: Diagnosis not present

## 2023-01-19 DIAGNOSIS — I951 Orthostatic hypotension: Secondary | ICD-10-CM | POA: Diagnosis not present

## 2023-01-20 ENCOUNTER — Telehealth: Payer: Self-pay

## 2023-01-20 NOTE — Telephone Encounter (Signed)
 Access nurse called to inform us that patient passed away at 7:22 pm last night.

## 2023-01-31 ENCOUNTER — Ambulatory Visit: Payer: Medicare Other | Admitting: Family Medicine

## 2023-02-11 DEATH — deceased

## 2023-02-21 ENCOUNTER — Ambulatory Visit: Payer: Medicare Other | Admitting: Urology
